# Patient Record
Sex: Male | Born: 1975 | Race: Black or African American | Hispanic: No | Marital: Married | Smoking: Never smoker
Health system: Southern US, Community
[De-identification: ages and names within clinical notes are randomized; demographics above are authoritative.]

## PROBLEM LIST (undated history)

## (undated) DIAGNOSIS — I10 Essential (primary) hypertension: Secondary | ICD-10-CM

## (undated) DIAGNOSIS — N186 End stage renal disease: Secondary | ICD-10-CM

## (undated) HISTORY — PX: EYE SURGERY: SHX253

---

## 2005-07-01 ENCOUNTER — Ambulatory Visit: Payer: Self-pay | Admitting: Unknown Physician Specialty

## 2007-09-20 ENCOUNTER — Inpatient Hospital Stay (HOSPITAL_COMMUNITY): Admission: EM | Admit: 2007-09-20 | Discharge: 2007-09-21 | Payer: Self-pay | Admitting: Emergency Medicine

## 2007-09-21 ENCOUNTER — Ambulatory Visit: Payer: Self-pay | Admitting: Psychiatry

## 2007-09-21 ENCOUNTER — Encounter (INDEPENDENT_AMBULATORY_CARE_PROVIDER_SITE_OTHER): Payer: Self-pay | Admitting: Cardiovascular Disease

## 2009-07-02 ENCOUNTER — Emergency Department: Payer: Self-pay | Admitting: Emergency Medicine

## 2010-04-02 ENCOUNTER — Emergency Department: Payer: Self-pay | Admitting: Emergency Medicine
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
## 2010-09-07 NOTE — Discharge Summary (Signed)
[name], [name]              ACCOUNT NO.:  [id number]   MEDICAL RECORD NO.:  [id number]          PATIENT TYPE:  INP   LOCATION:  3734                         FACILITY:  [facility]   PHYSICIAN:  [name], M.D.  DATE OF BIRTH:  1975-05-20   DATE OF ADMISSION:  09/20/2007  DATE OF DISCHARGE:  09/21/2007                               DISCHARGE SUMMARY   FINAL DIAGNOSES:  1. Paroxysmal atrial fibrillation.  2. Uncontrolled diabetes mellitus.  3. Depression.  4. Chronic alcohol use.   DISCHARGE MEDICATIONS:  1. Glucophage 500 mg 1 twice daily.  2. Zoloft 50 mg 1 daily.  3. Cardizem 120 mg extended release 1 daily.  4. Levemir 10-30 units subcutaneously at bedtime as directed.   DISCHARGE DIET:  Low-sodium, heart healthy diet and carbohydrate  modified medium calorie diet.   DISCHARGE ACTIVITY:  The patient to increase activity slowly.   SPECIAL INSTRUCTION:  The patient to stop any activity that causes chest  pain, shortness of breath, dizziness, sweating, or excessive weakness  and he is to discontinue alcohol of any kind including beer or wine.   FOLLOWUP:  Follow up by Dr. Orpah Cobb in 1 month.  The patient to  call [phone] for appointment.   HISTORY:  This 35 year old black male presented with heart palpitation  and fast heart rate shown on Electrocardiogram done as a preop  evaluation before his left eye cataract surgery.  The patient admitted  to using Stamina Rx containing caffeine to boost his energy, the night  before palpitation.   PHYSICAL EXAMINATION:  VITAL SIGNS:  Temperature 98, pulse 87,  respirations 19, blood pressure 110/63, height 5 feet 8 inches, and  weight 200 pounds.  GENERAL:  The patient is alert, oriented x3.  He is a 35 year old black  male in no major distress.  HEENT:  The patient is normocephalic and atraumatic.  He has brown eyes.  Pupil equal and reacting to light.  He has a right lens implant, left  lens hazy.  NECK:  No JVD.  No  carotid bruit.  LUNGS:  Clear bilaterally.  HEART:  Normal S1 and S2.  ABDOMEN:  Soft and nontender.  EXTREMITIES:  No edema, cyanosis, or clubbing.  SKIN:  Warm and dry.  NEUROLOGICALLY:  The patient moves all four extremities.   LABORATORY DATA:  Normal hemoglobin, hematocrit, WBC count, and platelet  count.  Normal  electrolytes, BUN, and creatinine.  Glucose 295.  Normal  liver enzymes, borderline bilirubin of 1.4.   Chest x-ray, no acute findings.   EKG, atrial fibrillation with a rapid ventricular response. Subsequent  EKG, normal sinus rhythm.   HOSPITAL COURSE:  The patient was admitted to telemetry unit.  He  received IV Cardizem.  The patient converted to sinus rhythm.  He was  switched to oral Cardizem.  His insulin was started.  He was placed on  Glucophage. With a history of depression of long duration, the patient  had a psychiatric evaluation by Dr. Electa Sniff.  The patient has been  referred to outpatient psychiatric followups.  He was started on Zoloft  50 mg 1 daily.  He was given instructions on diabetic control and care,  and he will be followed by primary care physician as arranged and by me  in 1 month.      [name], M.D.  Electronically Signed     ASK/MEDQ  D:  09/21/2007  T:  09/22/2007  Job:  [id number]

## 2011-01-19 LAB — POCT I-STAT, CHEM 8
Creatinine, Ser: 0.9
Glucose, Bld: 295 — ABNORMAL HIGH
Hemoglobin: 18.4 — ABNORMAL HIGH
Sodium: 140
TCO2: 23

## 2011-01-19 LAB — HEPATIC FUNCTION PANEL
ALT: 16
Indirect Bilirubin: 1.2 — ABNORMAL HIGH
Total Protein: 7.5

## 2011-01-19 LAB — CBC
MCV: 88.8
Platelets: 214
WBC: 6.4

## 2011-01-19 LAB — POCT CARDIAC MARKERS: Myoglobin, poc: 33.2

## 2011-01-19 LAB — LIPID PANEL
HDL: 42
Triglycerides: 145
VLDL: 29

## 2011-01-19 LAB — CARDIAC PANEL(CRET KIN+CKTOT+MB+TROPI)
CK, MB: 2.1
Relative Index: 2.1
Total CK: 100
Troponin I: 0.02
Troponin I: 0.02

## 2011-01-19 LAB — RAPID URINE DRUG SCREEN, HOSP PERFORMED
Benzodiazepines: NOT DETECTED
Cocaine: NOT DETECTED

## 2011-01-19 LAB — HEMOGLOBIN A1C: Mean Plasma Glucose: 425
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
## 2014-08-04 DIAGNOSIS — E113519 Type 2 diabetes mellitus with proliferative diabetic retinopathy with macular edema, unspecified eye: Secondary | ICD-10-CM | POA: Insufficient documentation

## 2017-05-17 DIAGNOSIS — H5015 Alternating exotropia: Secondary | ICD-10-CM | POA: Insufficient documentation

## 2017-05-17 DIAGNOSIS — H5021 Vertical strabismus, right eye: Secondary | ICD-10-CM | POA: Insufficient documentation

## 2017-08-16 DIAGNOSIS — I10 Essential (primary) hypertension: Secondary | ICD-10-CM | POA: Insufficient documentation

## 2017-12-22 DIAGNOSIS — Z6833 Body mass index (BMI) 33.0-33.9, adult: Secondary | ICD-10-CM | POA: Insufficient documentation

## 2017-12-22 DIAGNOSIS — H539 Unspecified visual disturbance: Secondary | ICD-10-CM | POA: Insufficient documentation

## 2017-12-22 DIAGNOSIS — R600 Localized edema: Secondary | ICD-10-CM | POA: Insufficient documentation

## 2019-04-26 HISTORY — PX: AV FISTULA PLACEMENT: SHX1204

## 2019-07-12 DIAGNOSIS — Z992 Dependence on renal dialysis: Secondary | ICD-10-CM | POA: Insufficient documentation

## 2019-08-13 DIAGNOSIS — E209 Hypoparathyroidism, unspecified: Secondary | ICD-10-CM | POA: Insufficient documentation

## 2019-08-13 DIAGNOSIS — D509 Iron deficiency anemia, unspecified: Secondary | ICD-10-CM | POA: Diagnosis present

## 2019-11-21 DIAGNOSIS — T829XXA Unspecified complication of cardiac and vascular prosthetic device, implant and graft, initial encounter: Secondary | ICD-10-CM | POA: Insufficient documentation

## 2021-03-25 DIAGNOSIS — H532 Diplopia: Secondary | ICD-10-CM | POA: Insufficient documentation

## 2021-03-25 DIAGNOSIS — E785 Hyperlipidemia, unspecified: Secondary | ICD-10-CM | POA: Insufficient documentation

## 2021-11-06 ENCOUNTER — Emergency Department: Payer: Self-pay

## 2021-11-06 ENCOUNTER — Other Ambulatory Visit: Payer: Self-pay

## 2021-11-06 ENCOUNTER — Encounter: Payer: Self-pay | Admitting: Emergency Medicine

## 2021-11-06 ENCOUNTER — Emergency Department
Admission: EM | Admit: 2021-11-06 | Discharge: 2021-11-06 | Disposition: A | Discharge status: Home / Self Care | Payer: Self-pay | Attending: Emergency Medicine | Admitting: Emergency Medicine

## 2021-11-06 DIAGNOSIS — S40021A Contusion of right upper arm, initial encounter: Secondary | ICD-10-CM | POA: Diagnosis not present

## 2021-11-06 DIAGNOSIS — Y92410 Unspecified street and highway as the place of occurrence of the external cause: Secondary | ICD-10-CM | POA: Insufficient documentation

## 2021-11-06 DIAGNOSIS — S4991XA Unspecified injury of right shoulder and upper arm, initial encounter: Secondary | ICD-10-CM | POA: Diagnosis present

## 2021-11-06 HISTORY — DX: End stage renal disease: N18.6

## 2021-11-06 HISTORY — DX: Essential (primary) hypertension: I10

## 2021-11-06 NOTE — ED Notes (Signed)
Pt states hasn't taken his evening BP med yet today but will when he gets home.

## 2021-11-06 NOTE — ED Notes (Signed)
Pt attempted to sign for d/c paperwork and education but topaz frozen.  
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
## 2022-09-21 ENCOUNTER — Encounter: Payer: Self-pay | Admitting: Nephrology

## 2022-09-30 ENCOUNTER — Encounter: Payer: Self-pay | Admitting: Internal Medicine

## 2022-11-07 ENCOUNTER — Ambulatory Visit (INDEPENDENT_AMBULATORY_CARE_PROVIDER_SITE_OTHER): Payer: PRIVATE HEALTH INSURANCE | Admitting: Internal Medicine

## 2022-11-07 ENCOUNTER — Other Ambulatory Visit (INDEPENDENT_AMBULATORY_CARE_PROVIDER_SITE_OTHER): Payer: PRIVATE HEALTH INSURANCE

## 2022-11-07 ENCOUNTER — Encounter: Payer: Self-pay | Admitting: Internal Medicine

## 2022-11-07 VITALS — BP 180/100 | HR 86 | Ht 69.0 in | Wt 207.0 lb

## 2022-11-07 DIAGNOSIS — K3184 Gastroparesis: Secondary | ICD-10-CM

## 2022-11-07 DIAGNOSIS — R1013 Epigastric pain: Secondary | ICD-10-CM | POA: Diagnosis not present

## 2022-11-07 DIAGNOSIS — R935 Abnormal findings on diagnostic imaging of other abdominal regions, including retroperitoneum: Secondary | ICD-10-CM | POA: Diagnosis not present

## 2022-11-07 DIAGNOSIS — Z1211 Encounter for screening for malignant neoplasm of colon: Secondary | ICD-10-CM

## 2022-11-07 DIAGNOSIS — D649 Anemia, unspecified: Secondary | ICD-10-CM
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
## 2022-11-07 NOTE — Patient Instructions (Addendum)
Your provider has requested that you go to the basement level for lab work before leaving today. Press "B" on the elevator. The lab is located at the first door on the left as you exit the elevator.  You have been scheduled for an endoscopy and colonoscopy. Please follow the written instructions given to you at your visit today.  Please pick up your prep supplies at the pharmacy within the next 1-3 days.  If you use inhalers (even only as needed), please bring them with you on the day of your procedure.  DO NOT TAKE 7 DAYS PRIOR TO TEST- Trulicity (dulaglutide) Ozempic, Wegovy (semaglutide) Mounjaro (tirzepatide) Bydureon Bcise (exanatide extended release)  DO NOT TAKE 1 DAY PRIOR TO YOUR TEST Rybelsus (semaglutide) Adlyxin (lixisenatide) Victoza (liraglutide) Byetta (exanatide) ___________________________________________________________________________    _______________________________________________________  If your blood pressure at your visit was 140/90 or greater, please contact your primary care physician to follow up on this.  _______________________________________________________  If you are age 68 or older, your body mass index should be between 23-30. Your Body mass index is 30.57 kg/m. If this is out of the aforementioned range listed, please consider follow up with your Primary Care Provider.  If you are age 5 or younger, your body mass index should be between 19-25. Your Body mass index is 30.57 kg/m. If this is out of the aformentioned range listed, please consider follow up with your Primary Care Provider.   ________________________________________________________  The [facility] providers would like to encourage you to use [facility] to communicate with providers for non-urgent requests or questions.  Due to long hold times on the telephone, sending your provider a message by [facility] may be a faster and more efficient way to get a response.  Please allow 48  business hours for a response.  Please remember that this is for non-urgent requests.  _______________________________________________________   Due to recent changes in healthcare laws, you may see the results of your imaging and laboratory studies on MyChart before your provider has had a chance to review them.  We understand that in some cases there may be results that are confusing or concerning to you. Not all laboratory results come back in the same time frame and the provider may be waiting for multiple results in order to interpret others.  Please give [place] 48 hours in order for your provider to thoroughly review all the results before contacting the office for clarification of your results.    Thank you for entrusting me with your care and for choosing [facility], Dr. Eulah Pont

## 2022-11-08 LAB — VITAMIN B12: Vitamin B-12: 206 pg/mL — ABNORMAL LOW (ref 211–911)

## 2022-11-08 LAB — IBC + FERRITIN
Ferritin: 718.9 ng/mL — ABNORMAL HIGH (ref 22.0–322.0)
Iron: 69 ug/dL (ref 42–165)
Saturation Ratios: 41.4 % (ref 20.0–50.0)
TIBC: 166.6 ug/dL — ABNORMAL LOW (ref 250.0–450.0)
Transferrin: 119 mg/dL — ABNORMAL LOW (ref 212.0–360.0)

## 2022-11-08 LAB — FOLATE: Folate: 9.8 ng/mL (ref >5.9)

## 2022-11-09 ENCOUNTER — Other Ambulatory Visit: Payer: Self-pay

## 2022-11-09 ENCOUNTER — Telehealth: Payer: Self-pay

## 2022-11-09 ENCOUNTER — Encounter (HOSPITAL_COMMUNITY): Payer: Self-pay | Admitting: Internal Medicine

## 2022-11-09 NOTE — Progress Notes (Signed)
[name]  Prep instructions- reviewed  PCP- [facility]  Cardiologist- Dr Katrinka Blazing at [facility]  EKG- 10/25/22 Echo-10/25/22 Cath-n/a Stress-n/a ICD/PM-n/a Blood thinner-n/a GLP-1- n/a  Hx:  HTN, ESRD, DM2. He is currently being worked up for kidney transplant through [facility]. Saw cardiologist at [facility] where they have done ekg, echo and are recommending cath due to him being high risk for CAD. At this time he is having no cardiac symptoms but of note his BP was elevated at his last cardiac appt on 7/2 and then again it was around the same at his GI appt 7/15 (around 180's systolic/ 100's diastolic). I asked him about his BP since then and he said he wasn't as strict about taking his meds and since then he has been better last checks were 140's/80's.  Anesthesia Review: Yes

## 2022-11-09 NOTE — Telephone Encounter (Signed)
Patient is scheduled for EGD/colonoscopy 11/14/22. Received secure chat from pre-admissions/anesthesia at [facility] requesting cardiac clearance for the patient before proceeding with the EGD and colonoscopy.  Letter printed and faxed to [facility] [address] [place], [place]  [address]  Attn: Dr Eduardo Osier  Phone - [phone] Fax - [phone]

## 2022-11-10 NOTE — Telephone Encounter (Signed)
Called the patient to make him aware of this as well. No answer. His voicemail is full.

## 2022-11-11 NOTE — Telephone Encounter (Signed)
Spoke with the patient. Confirmed with the patient he will not have the EGD and colonoscopy on 11/14/22. He will plan for the procedures to be done on 12/22/22. New instructions will be mailed to him. Cardiac catherization is scheduled for 11/17/22 per the patient.

## 2022-11-11 NOTE — Telephone Encounter (Signed)
Case for 11/14/22 canceled. Spoke with the patient on 11/10/22 and advised him this was probably going to happen. Tried to call the patient today. No answer. Voicemail is full. Case moved to 12/22/22.

## 2022-11-14 ENCOUNTER — Ambulatory Visit (HOSPITAL_COMMUNITY)
Admission: RE | Admit: 2022-11-14 | Payer: PRIVATE HEALTH INSURANCE | Source: Ambulatory Visit | Admitting: Internal Medicine

## 2022-11-14 SURGERY — COLONOSCOPY WITH PROPOFOL
Anesthesia: Monitor Anesthesia Care

## 2022-11-15 ENCOUNTER — Other Ambulatory Visit: Payer: Self-pay

## 2022-11-15 DIAGNOSIS — R935 Abnormal findings on diagnostic imaging of other abdominal regions, including retroperitoneum: Secondary | ICD-10-CM

## 2022-11-15 DIAGNOSIS — D649 Anemia, unspecified: Secondary | ICD-10-CM

## 2022-11-15 DIAGNOSIS — R1013 Epigastric pain: Secondary | ICD-10-CM

## 2022-11-15 NOTE — Telephone Encounter (Signed)
New instructions printed and mailed to the patient for his colonoscopy and EGD.

## 2022-11-21 ENCOUNTER — Telehealth: Payer: Self-pay

## 2022-11-21 NOTE — Telephone Encounter (Signed)
Received via fax medical clearance from Dr Katrinka Blazing okay to have procedure on 12/22/22.

## 2022-12-14 NOTE — Progress Notes (Addendum)
Anesthesia Review:  PCP: Cardiologist : Chest x-ray : EKG : Echo : Stress test: Cardiac Cath :  Activity level:  Sleep Study/ CPAP : Fasting Blood Sugar :      / Checks Blood Sugar -- times a day:   Blood Thinner/ Instructions /Last Dose: ASA / Instructions/ Last Dose :    Dialysis pt  Access-    PT was called on 12/14/22 for preop instructions in regards to colonoscopy on 12/22/22.  PT states this am he had a small procedure done to his fistual on 12/14/22 am and he has had some sedating meds and wanted a call back at another time.

## 2022-12-15 ENCOUNTER — Encounter (HOSPITAL_COMMUNITY): Payer: Self-pay | Admitting: Internal Medicine

## 2022-12-22 ENCOUNTER — Encounter (HOSPITAL_COMMUNITY): Payer: Self-pay | Admitting: Internal Medicine

## 2022-12-22 ENCOUNTER — Ambulatory Visit (HOSPITAL_COMMUNITY)
Admission: RE | Admit: 2022-12-22 | Discharge: 2022-12-22 | Disposition: A | Discharge status: Home / Self Care | Payer: PRIVATE HEALTH INSURANCE | Source: Ambulatory Visit | Attending: Internal Medicine | Admitting: Internal Medicine

## 2022-12-22 ENCOUNTER — Other Ambulatory Visit: Payer: Self-pay

## 2022-12-22 ENCOUNTER — Encounter (HOSPITAL_COMMUNITY)
Admission: RE | Disposition: A | Discharge status: Home / Self Care | Payer: Self-pay | Source: Ambulatory Visit | Attending: Internal Medicine

## 2022-12-22 ENCOUNTER — Ambulatory Visit (HOSPITAL_COMMUNITY): Payer: PRIVATE HEALTH INSURANCE | Admitting: Certified Registered"

## 2022-12-22 DIAGNOSIS — K2970 Gastritis, unspecified, without bleeding: Secondary | ICD-10-CM | POA: Diagnosis not present

## 2022-12-22 DIAGNOSIS — K648 Other hemorrhoids: Secondary | ICD-10-CM | POA: Diagnosis not present

## 2022-12-22 DIAGNOSIS — N186 End stage renal disease: Secondary | ICD-10-CM | POA: Diagnosis not present

## 2022-12-22 DIAGNOSIS — K3189 Other diseases of stomach and duodenum: Secondary | ICD-10-CM | POA: Diagnosis not present

## 2022-12-22 DIAGNOSIS — Z1211 Encounter for screening for malignant neoplasm of colon: Secondary | ICD-10-CM | POA: Diagnosis present

## 2022-12-22 DIAGNOSIS — K3184 Gastroparesis: Secondary | ICD-10-CM | POA: Insufficient documentation

## 2022-12-22 DIAGNOSIS — R933 Abnormal findings on diagnostic imaging of other parts of digestive tract: Secondary | ICD-10-CM | POA: Diagnosis not present

## 2022-12-22 DIAGNOSIS — R1013 Epigastric pain: Secondary | ICD-10-CM

## 2022-12-22 DIAGNOSIS — Z992 Dependence on renal dialysis: Secondary | ICD-10-CM | POA: Diagnosis not present

## 2022-12-22 DIAGNOSIS — R935 Abnormal findings on diagnostic imaging of other abdominal regions, including retroperitoneum: Secondary | ICD-10-CM

## 2022-12-22 DIAGNOSIS — D649 Anemia, unspecified: Secondary | ICD-10-CM

## 2022-12-22 DIAGNOSIS — I120 Hypertensive chronic kidney disease with stage 5 chronic kidney disease or end stage renal disease: Secondary | ICD-10-CM

## 2022-12-22 DIAGNOSIS — K31A19 Gastric intestinal metaplasia without dysplasia, unspecified site: Secondary | ICD-10-CM | POA: Diagnosis not present

## 2022-12-22 DIAGNOSIS — D123 Benign neoplasm of transverse colon: Secondary | ICD-10-CM | POA: Diagnosis not present

## 2022-12-22 DIAGNOSIS — D126 Benign neoplasm of colon, unspecified: Secondary | ICD-10-CM | POA: Diagnosis not present

## 2022-12-22 DIAGNOSIS — K2950 Unspecified chronic gastritis without bleeding: Secondary | ICD-10-CM | POA: Insufficient documentation

## 2022-12-22 HISTORY — PX: ESOPHAGOGASTRODUODENOSCOPY (EGD) WITH PROPOFOL: SHX5813

## 2022-12-22 HISTORY — PX: COLONOSCOPY WITH PROPOFOL: SHX5780

## 2022-12-22 HISTORY — PX: POLYPECTOMY: SHX5525

## 2022-12-22 HISTORY — PX: BIOPSY: SHX5522

## 2022-12-22 LAB — POCT I-STAT, CHEM 8
BUN: 20 mg/dL (ref 6–20)
Calcium, Ion: 1.12 mmol/L — ABNORMAL LOW (ref 1.15–1.40)
Chloride: 101 mmol/L (ref 98–111)
Creatinine, Ser: 9.5 mg/dL — ABNORMAL HIGH (ref 0.61–1.24)
Glucose, Bld: 87 mg/dL (ref 70–99)
HCT: 41 % (ref 39.0–52.0)
Hemoglobin: 13.9 g/dL (ref 13.0–17.0)
Potassium: 3.3 mmol/L — ABNORMAL LOW (ref 3.5–5.1)
Sodium: 139 mmol/L (ref 135–145)
TCO2: 26 mmol/L (ref 22–32)

## 2022-12-22 SURGERY — COLONOSCOPY WITH PROPOFOL
Anesthesia: Monitor Anesthesia Care

## 2022-12-22 MED ORDER — PROPOFOL 10 MG/ML IV BOLUS
INTRAVENOUS | Status: DC | PRN
Start: 2022-12-22 — End: 2022-12-22
  Administered 2022-12-22 (×2): 20 mg via INTRAVENOUS

## 2022-12-22 MED ORDER — SODIUM CHLORIDE 0.9 % IV SOLN: INTRAVENOUS | Status: DC

## 2022-12-22 MED ORDER — LIDOCAINE 2% (20 MG/ML) 5 ML SYRINGE
INTRAMUSCULAR | Status: DC | PRN
  Administered 2022-12-22: 80 mg via INTRAVENOUS

## 2022-12-22 MED ORDER — PROPOFOL 500 MG/50ML IV EMUL
INTRAVENOUS | Status: DC | PRN
  Administered 2022-12-22: 125 ug/kg/min via INTRAVENOUS

## 2022-12-22 MED ORDER — PANTOPRAZOLE SODIUM 40 MG PO TBEC: 40.0000 mg | DELAYED_RELEASE_TABLET | Freq: Two times a day (BID) | ORAL | 2 refills | Status: AC

## 2022-12-22 SURGICAL SUPPLY — 25 items
BLOCK BITE 60FR ADLT L/F BLUE (MISCELLANEOUS) ×2 IMPLANT
ELECT REM PT RETURN 9FT ADLT (ELECTROSURGICAL)
ELECTRODE REM PT RTRN 9FT ADLT (ELECTROSURGICAL) IMPLANT
FCP BXJMBJMB 240X2.8X (CUTTING FORCEPS)
FLOOR PAD 36X40 (MISCELLANEOUS) ×2
FORCEP RJ3 GP 1.8X160 W-NEEDLE (CUTTING FORCEPS) IMPLANT
FORCEPS BIOP RAD 4 LRG CAP 4 (CUTTING FORCEPS) IMPLANT
FORCEPS BIOP RJ4 240 W/NDL (CUTTING FORCEPS)
FORCEPS BXJMBJMB 240X2.8X (CUTTING FORCEPS) IMPLANT
INJECTOR/SNARE I SNARE (MISCELLANEOUS) IMPLANT
LUBRICANT JELLY 4.5OZ STERILE (MISCELLANEOUS) IMPLANT
MANIFOLD NEPTUNE II (INSTRUMENTS) IMPLANT
NDL SCLEROTHERAPY 25GX240 (NEEDLE) IMPLANT
NEEDLE SCLEROTHERAPY 25GX240 (NEEDLE)
PAD FLOOR 36X40 (MISCELLANEOUS) ×2 IMPLANT
PROBE APC STR FIRE (PROBE) IMPLANT
PROBE INJECTION GOLD (MISCELLANEOUS)
PROBE INJECTION GOLD 7FR (MISCELLANEOUS) IMPLANT
SNARE ROTATE MED OVAL 20MM (MISCELLANEOUS) IMPLANT
SNARE SHORT THROW 13M SML OVAL (MISCELLANEOUS) IMPLANT
SYR 50ML LL SCALE MARK (SYRINGE) IMPLANT
TRAP SPECIMEN MUCOUS 40CC (MISCELLANEOUS) IMPLANT
TUBING ENDO SMARTCAP PENTAX (MISCELLANEOUS) ×4 IMPLANT
TUBING IRRIGATION ENDOGATOR (MISCELLANEOUS) ×2 IMPLANT
WATER STERILE IRR 1000ML POUR (IV SOLUTION) IMPLANT

## 2022-12-22 NOTE — Anesthesia Preprocedure Evaluation (Addendum)
Anesthesia Evaluation  Patient identified by MRN, date of birth, ID band Patient awake    Reviewed: Allergy & Precautions, NPO status , Patient's Chart, lab work & pertinent test results, reviewed documented beta blocker date and time   Airway Mallampati: II  TM Distance: >3 FB Neck ROM: Full    Dental  (+) Teeth Intact, Dental Advisory Given, Poor Dentition, Chipped,    Pulmonary neg pulmonary ROS   Pulmonary exam normal breath sounds clear to auscultation       Cardiovascular hypertension, Pt. on medications and Pt. on home beta blockers Normal cardiovascular exam Rhythm:Regular Rate:Normal     Neuro/Psych negative neurological ROS  negative psych ROS   GI/Hepatic Neg liver ROS,GERD  Medicated,,CCS, Gastroparesis   Endo/Other  negative endocrine ROS    Renal/GU ESRF and DialysisRenal disease (home hemodialysis)currently being worked up for kidney transplant through [facility]     Musculoskeletal negative musculoskeletal ROS (+)    Abdominal   Peds  Hematology negative hematology ROS (+)   Anesthesia Other Findings Day of surgery medications reviewed with the patient.  Reproductive/Obstetrics                             Anesthesia Physical Anesthesia Plan  ASA: 3  Anesthesia Plan: MAC   Post-op Pain Management:    Induction: Intravenous  PONV Risk Score and Plan: 1 and TIVA and Treatment may vary due to age or medical condition  Airway Management Planned: Natural Airway and Simple Face Mask  Additional Equipment:   Intra-op Plan:   Post-operative Plan:   Informed Consent: I have reviewed the patients History and Physical, chart, labs and discussed the procedure including the risks, benefits and alternatives for the proposed anesthesia with the patient or authorized representative who has indicated his/her understanding and acceptance.     Dental advisory given  Plan Discussed  with: CRNA and Anesthesiologist  Anesthesia Plan Comments:        Anesthesia Quick Evaluation

## 2022-12-22 NOTE — Anesthesia Postprocedure Evaluation (Signed)
Anesthesia Post Note  Patient: FAHD ANTCZAK  Procedure(s) Performed: COLONOSCOPY WITH PROPOFOL ESOPHAGOGASTRODUODENOSCOPY (EGD) WITH PROPOFOL BIOPSY POLYPECTOMY     Patient location during evaluation: Endoscopy Anesthesia Type: MAC Level of consciousness: oriented, awake and alert and awake Pain management: pain level controlled Vital Signs Assessment: post-procedure vital signs reviewed and stable Respiratory status: spontaneous breathing, nonlabored ventilation, respiratory function stable and patient connected to nasal cannula oxygen Cardiovascular status: blood pressure returned to baseline and stable Postop Assessment: no headache, no backache and no apparent nausea or vomiting Anesthetic complications: no   No notable events documented.  Last Vitals:  Vitals:   12/22/22 1140 12/22/22 1150  BP: (!) 162/91 (!) 174/100  Pulse: 81 79  Resp: 20 16  Temp:    SpO2: 100% 100%    Last Pain:  Vitals:   12/22/22 1150  TempSrc:   PainSc: 0-No pain                 Collene Schlichter

## 2022-12-22 NOTE — Op Note (Signed)
[facility] Patient Name: [name] Procedure Date: 12/22/2022 MRN: [id number] Attending MD: Particia Lather , , [id number] Date of Birth: 11-20-1975 CSN: [id number] Age: 47 Admit Type: Outpatient Procedure:                Colonoscopy Indications:              Screening for colorectal malignant neoplasm Providers:                Madelyn Brunner" Daylene Posey, RN,                            Jacquelyn "Jaci" Clelia Croft, RN, Rozetta Nunnery,                            Technician Referring MD:             Starla Link, MD Medicines:                Monitored Anesthesia Care Complications:            No immediate complications. Estimated Blood Loss:     Estimated blood loss was minimal. Procedure:                Pre-Anesthesia Assessment:                           - Prior to the procedure, a History and Physical                            was performed, and patient medications and                            allergies were reviewed. The patient's tolerance of                            previous anesthesia was also reviewed. The risks                            and benefits of the procedure and the sedation                            options and risks were discussed with the patient.                            All questions were answered, and informed consent                            was obtained. Prior Anticoagulants: The patient has                            taken no anticoagulant or antiplatelet agents. ASA                            Grade Assessment: III - A patient with severe  systemic disease. After reviewing the risks and                            benefits, the patient was deemed in satisfactory                            condition to undergo the procedure.                           After obtaining informed consent, the colonoscope                            was passed under direct vision. Throughout the                             procedure, the patient's blood pressure, pulse, and                            oxygen saturations were monitored continuously. The                            [id number]-HQ190L ([id number]) Olympus colonoscope was                            introduced through the anus and advanced to the the                            terminal ileum. The colonoscopy was performed                            without difficulty. The patient tolerated the                            procedure well. The quality of the bowel                            preparation was good. The terminal ileum, ileocecal                            valve, appendiceal orifice, and rectum were                            photographed. Scope In: 11:17:30 AM Scope Out: 11:29:04 AM Scope Withdrawal Time: 0 hours 8 minutes 22 seconds  Total Procedure Duration: 0 hours 11 minutes 34 seconds  Findings:      The terminal ileum appeared normal.      A 4 mm polyp was found in the transverse colon. The polyp was sessile.       The polyp was removed with a cold snare. Resection and retrieval were       complete.      Non-bleeding internal hemorrhoids were found during retroflexion. Impression:               - The examined portion of the ileum was normal.                           -  One 4 mm polyp in the transverse colon, removed                            with a cold snare. Resected and retrieved.                           - Non-bleeding internal hemorrhoids. Moderate Sedation:      Not Applicable - Patient had care per Anesthesia. Recommendation:           - Discharge patient to home (with escort).                           - Await pathology results.                           - The findings and recommendations were discussed                            with the patient. Procedure Code(s):        --- Professional ---                           [phone], Colonoscopy, flexible; with removal of                            tumor(s), polyp(s), or other  lesion(s) by snare                            technique Diagnosis Code(s):        --- Professional ---                           Z12.11, Encounter for screening for malignant                            neoplasm of colon                           K64.8, Other hemorrhoids                           D12.3, Benign neoplasm of transverse colon (hepatic                            flexure or splenic flexure) CPT copyright 2022 American Medical Association. All rights reserved. The codes documented in this report are preliminary and upon coder review may  be revised to meet current compliance requirements. Dr Particia Lather "Alan Ripper" Leonides Schanz,  12/22/2022 11:42:15 AM Number of Addenda: 0

## 2022-12-22 NOTE — Op Note (Signed)
[facility] Patient Name: [name] Procedure Date: 12/22/2022 MRN: [id number] Attending MD: Particia Lather , , [id number] Date of Birth: 1975/08/22 CSN: [id number] Age: 47 Admit Type: Outpatient Procedure:                Upper GI endoscopy Indications:              Epigastric abdominal pain, Abnormal CT of the GI                            tract Providers:                Madelyn Brunner" Daylene Posey, RN,                            Jacquelyn "Jaci" Clelia Croft, RN, Rozetta Nunnery,                            Technician Referring MD:             Starla Link, MD Medicines:                Monitored Anesthesia Care Complications:            No immediate complications. Estimated Blood Loss:     Estimated blood loss was minimal. Procedure:                Pre-Anesthesia Assessment:                           - Prior to the procedure, a History and Physical                            was performed, and patient medications and                            allergies were reviewed. The patient's tolerance of                            previous anesthesia was also reviewed. The risks                            and benefits of the procedure and the sedation                            options and risks were discussed with the patient.                            All questions were answered, and informed consent                            was obtained. Prior Anticoagulants: The patient has                            taken no anticoagulant or antiplatelet agents. ASA  Grade Assessment: III - A patient with severe                            systemic disease. After reviewing the risks and                            benefits, the patient was deemed in satisfactory                            condition to undergo the procedure.                           After obtaining informed consent, the endoscope was                            passed under direct  vision. Throughout the                            procedure, the patient's blood pressure, pulse, and                            oxygen saturations were monitored continuously. The                            [id number]-[id number] ([id number]) Olympus endoscope was introduced                            through the mouth, and advanced to the second part                            of duodenum. The upper GI endoscopy was                            accomplished without difficulty. The patient                            tolerated the procedure well. Scope In: Scope Out: Findings:      The examined esophagus was normal.      Localized inflammation characterized by congestion (edema), erosions and       erythema was found in the gastric body and in the gastric antrum.       Biopsies were taken with a cold forceps for histology.      The examined duodenum was normal. Impression:               - Normal esophagus.                           - Gastritis. Biopsied.                           - Normal examined duodenum. Moderate Sedation:      Not Applicable - Patient had care per Anesthesia. Recommendation:           - Await pathology results.                           -  Will increase pantoprazole to 40 mg twice daily.                           - Return to GI clinic in 2-3 months.                           - Perform a colonoscopy today. Procedure Code(s):        --- Professional ---                           [phone], Esophagogastroduodenoscopy, flexible,                            transoral; with biopsy, single or multiple Diagnosis Code(s):        --- Professional ---                           K29.70, Gastritis, unspecified, without bleeding                           R10.13, Epigastric pain                           R93.3, Abnormal findings on diagnostic imaging of                            other parts of digestive tract CPT copyright 2022 American Medical Association. All rights reserved. The codes documented in  this report are preliminary and upon coder review may  be revised to meet current compliance requirements. Dr Particia Lather "Alan Ripper" Leonides Schanz,  12/22/2022 11:40:18 AM Number of Addenda: 0

## 2022-12-22 NOTE — H&P (Signed)
GASTROENTEROLOGY PROCEDURE H&P NOTE   Primary Care Physician: Starla Link, MD    Reason for Procedure:   Gastritis on CT scan, epigastric ab pain, colon cancer screening  Plan:    EGD/colonoscopy  Patient is appropriate for endoscopic procedure(s) in the hospital setting.  The nature of the procedure, as well as the risks, benefits, and alternatives were carefully and thoroughly reviewed with the patient. Ample time for discussion and questions allowed. The patient understood, was satisfied, and agreed to proceed.     HPI: [name] is a 47 y.o. male who presents for EGD/colonoscopy for evaluation of gastritis on CT scan, epigastric ab pain, and colon cancer screening .  Patient was most recently seen in the Gastroenterology Clinic on 11/07/22.  No interval change in medical history since that appointment. Please refer to that note for full details regarding GI history and clinical presentation.   Past Medical History:  Diagnosis Date   ESRD (end stage renal disease) (HCC)    Hypertension     Past Surgical History:  Procedure Laterality Date   EYE SURGERY      Prior to Admission medications   Medication Sig Start Date End Date Taking? Authorizing Provider  amLODipine (NORVASC) 10 MG tablet Take by mouth daily. Patient not taking: Reported on 12/15/2022    [provider]  calcitRIOL (ROCALTROL) 0.5 MCG capsule Take 0.5 mcg by mouth daily.    [provider]  carvedilol (COREG) 25 MG tablet Take 25 mg by mouth 2 (two) times daily with a meal.    [provider]  doxazosin (CARDURA) 2 MG tablet Take 2 mg by mouth at bedtime.    [provider]  metoCLOPramide (REGLAN) 5 MG tablet Take 5 mg by mouth 4 (four) times daily.    [provider]  pantoprazole (PROTONIX) 40 MG tablet Take 40 mg by mouth daily.    [provider]    Current Facility-Administered Medications  Medication Dose Route Frequency Provider  Last Rate Last Admin   0.9 %  sodium chloride infusion   Intravenous Continuous Imogene Burn, MD        Allergies as of 11/11/2022 - Review Complete 11/09/2022  Allergen Reaction Noted   Morphine Swelling 11/06/2021   Sulfa antibiotics Swelling 11/06/2021    Family History  Problem Relation Age of Onset   Diabetes Mother    Lung disease Father    Liver disease Neg Hx    Esophageal cancer Neg Hx    Colon cancer Neg Hx     Social History   Socioeconomic History   Marital status: Married    Spouse name: Not on file   Number of children: 4   Years of education: Not on file   Highest education level: Not on file  Occupational History   Occupation: disable  Tobacco Use   Smoking status: Never   Smokeless tobacco: Never  Vaping Use   Vaping status: Not on file  Substance and Sexual Activity   Alcohol use: Not on file   Drug use: Not on file   Sexual activity: Not on file  Other Topics Concern   Not on file  Social History Narrative   Not on file   Social Determinants of Health   Financial Resource Strain: Low Risk  (10/30/2019)   Received from [facility], [facility]   Overall Financial Resource Strain (CARDIA)    Difficulty of Paying Living Expenses: Not hard at  all  Food Insecurity: No Food Insecurity (10/30/2019)   Received from [facility], [facility]   Hunger Vital Sign    Worried About Running Out of Food in the Last Year: Never true    Ran Out of Food in the Last Year: Never true  Transportation Needs: No Transportation Needs (10/30/2019)   Received from [facility], [facility]   [facility] - Transportation    In the past 12 months, has lack of transportation kept you from medical appointments or from getting medications?: No    Lack of Transportation (Non-Medical): No  Physical Activity: Insufficiently Active (10/30/2019)   Received from [facility], [facility]   Exercise Vital Sign    Days of Exercise per Week: 3 days    Minutes of Exercise per Session: 10 min  Stress: No Stress Concern Present (11/16/2018)   Received from [facility], [facility]   [employer] of Occupational Health - Occupational Stress Questionnaire    Feeling of Stress : Only a little  Social Connections: Moderately Isolated (11/16/2018)   Received from [facility], [facility]   Social Connection and Isolation Panel [NHANES]    Frequency of Communication with Friends and Family: More than three times a week    Frequency of Social Gatherings with Friends and Family: Never    Attends Religious Services: Never    Database administrator or Organizations: No    Attends Banker Meetings: Never    Marital Status: Married  Catering manager Violence: Not on file    Physical Exam: Vital signs in last 24 hours: Wt 91.3 kg   BMI 29.72 kg/m  GEN: NAD EYE: Sclerae anicteric ENT: MMM CV: Non-tachycardic Pulm: No increased WOB GI: Soft NEURO:  Alert & Oriented   Eulah Pont, MD [facility]   12/22/2022 10:34 AM

## 2022-12-22 NOTE — Transfer of Care (Signed)
Immediate Anesthesia Transfer of Care Note  Patient: [name]  Procedure(s) Performed: COLONOSCOPY WITH PROPOFOL ESOPHAGOGASTRODUODENOSCOPY (EGD) WITH PROPOFOL BIOPSY POLYPECTOMY  Patient Location: PACU  Anesthesia Type:MAC  Level of Consciousness: awake, alert , and oriented  Airway & Oxygen Therapy: Patient Spontanous Breathing and Patient connected to face mask oxygen  Post-op Assessment: Report given to RN and Post -op Vital signs reviewed and stable  Post vital signs: Reviewed and stable  Last Vitals:  Vitals Value Taken Time  BP    Temp    Pulse    Resp    SpO2      Last Pain:  Vitals:   12/22/22 1034  TempSrc: Temporal  PainSc: 0-No pain         Complications: No notable events documented.

## 2022-12-22 NOTE — Discharge Instructions (Signed)
YOU HAD AN ENDOSCOPIC PROCEDURE TODAY: Refer to the procedure report and other information in the discharge instructions given to you for any specific questions about what was found during the examination. If this information does not answer your questions, please call [place] office at [phone] to clarify.  ° °YOU SHOULD EXPECT: Some feelings of bloating in the abdomen. Passage of more gas than usual. Walking can help get rid of the air that was put into your GI tract during the procedure and reduce the bloating. If you had a lower endoscopy (such as a colonoscopy or flexible sigmoidoscopy) you may notice spotting of blood in your stool or on the toilet paper. Some abdominal soreness may be present for a day or two, also. ° °DIET: Your first meal following the procedure should be a light meal and then it is ok to progress to your normal diet. A half-sandwich or bowl of soup is an example of a good first meal. Heavy or fried foods are harder to digest and may make you feel nauseous or bloated. Drink plenty of fluids but you should avoid alcoholic beverages for 24 hours.  ° °ACTIVITY: Your care partner should take you home directly after the procedure. You should plan to take it easy, moving slowly for the rest of the day. You can resume normal activity the day after the procedure however YOU SHOULD NOT DRIVE, use power tools, machinery or perform tasks that involve climbing or major physical exertion for 24 hours (because of the sedation medicines used during the test).  ° °SYMPTOMS TO REPORT IMMEDIATELY: °A gastroenterologist can be reached at any hour. Please call [phone]  for any of the following symptoms:  °Following lower endoscopy (colonoscopy, flexible sigmoidoscopy) °Excessive amounts of blood in the stool  °Significant tenderness, worsening of abdominal pains  °Swelling of the abdomen that is new, acute  °Fever of 100° or higher  °Following upper endoscopy (EGD, EUS, ERCP, esophageal  dilation) °Vomiting of blood or coffee ground material  °New, significant abdominal pain  °New, significant chest pain or pain under the shoulder blades  °Painful or persistently difficult swallowing  °New shortness of breath  °Black, tarry-looking or red, bloody stools ° °FOLLOW UP:  °If any biopsies were taken you will be contacted by phone or by letter within the next 1-3 weeks. Call [phone]  if you have not heard about the biopsies in 3 weeks.  °Please also call with any specific questions about appointments or follow up tests.  °

## 2022-12-23 ENCOUNTER — Other Ambulatory Visit (HOSPITAL_COMMUNITY): Payer: Self-pay

## 2022-12-23 ENCOUNTER — Telehealth: Payer: Self-pay

## 2022-12-23 NOTE — Telephone Encounter (Signed)
Pharmacy Patient Advocate Encounter   Received notification from [facility] Portal that prior authorization for PANTOPRAZOLE 40MG  is required/requested.   Insurance verification completed.   The patient is insured through [facility] .   Per test claim: PA required; PA submitted to [facility] via Prompt PA Key/confirmation #/[id number] Status is pending

## 2022-12-26 ENCOUNTER — Encounter (HOSPITAL_COMMUNITY): Payer: Self-pay | Admitting: Internal Medicine

## 2022-12-27 ENCOUNTER — Other Ambulatory Visit (HOSPITAL_COMMUNITY): Payer: Self-pay

## 2022-12-27 NOTE — Telephone Encounter (Signed)
Pharmacy Patient Advocate Encounter  Received notification from [facility] that Prior Authorization for Pantoprazole 40mg  has been APPROVED from 12/22/22 to 12/22/23. Ran test claim, Copay is $6.74. This test claim was processed through [facility]- copay amounts may vary at other pharmacies due to pharmacy/plan contracts, or as the patient moves through the different stages of their insurance plan.   PA #/Case ID/Reference #: [id number]

## 2022-12-29 ENCOUNTER — Encounter: Payer: Self-pay | Admitting: Internal Medicine

## 2022-12-29 LAB — SURGICAL PATHOLOGY

## 2023-01-05 ENCOUNTER — Emergency Department (HOSPITAL_COMMUNITY): Payer: PRIVATE HEALTH INSURANCE

## 2023-01-05 ENCOUNTER — Inpatient Hospital Stay (HOSPITAL_COMMUNITY)
Admission: EM | Admit: 2023-01-05 | Discharge: 2023-01-12 | DRG: 193 | Disposition: A | Discharge status: Home / Self Care | Payer: PRIVATE HEALTH INSURANCE | Attending: Internal Medicine | Admitting: Internal Medicine

## 2023-01-05 ENCOUNTER — Other Ambulatory Visit: Payer: Self-pay

## 2023-01-05 ENCOUNTER — Encounter (HOSPITAL_COMMUNITY): Payer: Self-pay

## 2023-01-05 DIAGNOSIS — R946 Abnormal results of thyroid function studies: Secondary | ICD-10-CM | POA: Diagnosis present

## 2023-01-05 DIAGNOSIS — I700 Atherosclerosis of aorta: Secondary | ICD-10-CM | POA: Diagnosis present

## 2023-01-05 DIAGNOSIS — Z882 Allergy status to sulfonamides status: Secondary | ICD-10-CM

## 2023-01-05 DIAGNOSIS — Z833 Family history of diabetes mellitus: Secondary | ICD-10-CM

## 2023-01-05 DIAGNOSIS — E119 Type 2 diabetes mellitus without complications: Secondary | ICD-10-CM

## 2023-01-05 DIAGNOSIS — Z885 Allergy status to narcotic agent status: Secondary | ICD-10-CM

## 2023-01-05 DIAGNOSIS — N2581 Secondary hyperparathyroidism of renal origin: Secondary | ICD-10-CM | POA: Diagnosis present

## 2023-01-05 DIAGNOSIS — R931 Abnormal findings on diagnostic imaging of heart and coronary circulation: Secondary | ICD-10-CM

## 2023-01-05 DIAGNOSIS — E1143 Type 2 diabetes mellitus with diabetic autonomic (poly)neuropathy: Secondary | ICD-10-CM | POA: Diagnosis present

## 2023-01-05 DIAGNOSIS — D631 Anemia in chronic kidney disease: Secondary | ICD-10-CM | POA: Diagnosis present

## 2023-01-05 DIAGNOSIS — N186 End stage renal disease: Secondary | ICD-10-CM | POA: Diagnosis present

## 2023-01-05 DIAGNOSIS — Z79899 Other long term (current) drug therapy: Secondary | ICD-10-CM

## 2023-01-05 DIAGNOSIS — K3184 Gastroparesis: Secondary | ICD-10-CM

## 2023-01-05 DIAGNOSIS — D72829 Elevated white blood cell count, unspecified: Secondary | ICD-10-CM | POA: Diagnosis present

## 2023-01-05 DIAGNOSIS — J189 Pneumonia, unspecified organism: Secondary | ICD-10-CM | POA: Diagnosis not present

## 2023-01-05 DIAGNOSIS — R112 Nausea with vomiting, unspecified: Secondary | ICD-10-CM | POA: Diagnosis not present

## 2023-01-05 DIAGNOSIS — Z992 Dependence on renal dialysis: Secondary | ICD-10-CM

## 2023-01-05 DIAGNOSIS — E1122 Type 2 diabetes mellitus with diabetic chronic kidney disease: Secondary | ICD-10-CM | POA: Diagnosis present

## 2023-01-05 DIAGNOSIS — I132 Hypertensive heart and chronic kidney disease with heart failure and with stage 5 chronic kidney disease, or end stage renal disease: Secondary | ICD-10-CM | POA: Diagnosis present

## 2023-01-05 DIAGNOSIS — I272 Pulmonary hypertension, unspecified: Secondary | ICD-10-CM | POA: Diagnosis present

## 2023-01-05 DIAGNOSIS — R7989 Other specified abnormal findings of blood chemistry: Secondary | ICD-10-CM | POA: Diagnosis not present

## 2023-01-05 DIAGNOSIS — I420 Dilated cardiomyopathy: Secondary | ICD-10-CM

## 2023-01-05 DIAGNOSIS — R5381 Other malaise: Secondary | ICD-10-CM

## 2023-01-05 DIAGNOSIS — Y95 Nosocomial condition: Secondary | ICD-10-CM | POA: Diagnosis present

## 2023-01-05 DIAGNOSIS — Z1152 Encounter for screening for COVID-19: Secondary | ICD-10-CM

## 2023-01-05 DIAGNOSIS — I493 Ventricular premature depolarization: Secondary | ICD-10-CM | POA: Diagnosis present

## 2023-01-05 DIAGNOSIS — I10 Essential (primary) hypertension: Secondary | ICD-10-CM

## 2023-01-05 DIAGNOSIS — I5043 Acute on chronic combined systolic (congestive) and diastolic (congestive) heart failure: Secondary | ICD-10-CM | POA: Diagnosis present

## 2023-01-05 DIAGNOSIS — H543 Unqualified visual loss, both eyes: Secondary | ICD-10-CM | POA: Diagnosis present

## 2023-01-05 DIAGNOSIS — K8020 Calculus of gallbladder without cholecystitis without obstruction: Secondary | ICD-10-CM | POA: Diagnosis present

## 2023-01-05 DIAGNOSIS — D638 Anemia in other chronic diseases classified elsewhere: Secondary | ICD-10-CM

## 2023-01-05 LAB — CBC WITH DIFFERENTIAL/PLATELET
Abs Immature Granulocytes: 0.02 10*3/uL (ref 0.00–0.07)
Basophils Absolute: 0 10*3/uL (ref 0.0–0.1)
Basophils Relative: 1 %
Eosinophils Absolute: 0 10*3/uL (ref 0.0–0.5)
Eosinophils Relative: 1 %
HCT: 32.7 % — ABNORMAL LOW (ref 39.0–52.0)
Hemoglobin: 10.1 g/dL — ABNORMAL LOW (ref 13.0–17.0)
Immature Granulocytes: 0 %
Lymphocytes Relative: 7 %
Lymphs Abs: 0.4 10*3/uL — ABNORMAL LOW (ref 0.7–4.0)
MCH: 28.7 pg (ref 26.0–34.0)
MCHC: 30.9 g/dL (ref 30.0–36.0)
MCV: 92.9 fL (ref 80.0–100.0)
Monocytes Absolute: 0.6 10*3/uL (ref 0.1–1.0)
Monocytes Relative: 10 %
Neutro Abs: 4.6 10*3/uL (ref 1.7–7.7)
Neutrophils Relative %: 81 %
Platelets: 125 10*3/uL — ABNORMAL LOW (ref 150–400)
RBC: 3.52 MIL/uL — ABNORMAL LOW (ref 4.22–5.81)
RDW: 14.8 % (ref 11.5–15.5)
WBC: 5.6 10*3/uL (ref 4.0–10.5)
nRBC: 0 % (ref 0.0–0.2)

## 2023-01-05 LAB — COMPREHENSIVE METABOLIC PANEL
ALT: 16 U/L (ref 0–44)
AST: 15 U/L (ref 15–41)
Albumin: 3.9 g/dL (ref 3.5–5.0)
Alkaline Phosphatase: 47 U/L (ref 38–126)
Anion gap: 16 — ABNORMAL HIGH (ref 5–15)
BUN: 29 mg/dL — ABNORMAL HIGH (ref 6–20)
CO2: 21 mmol/L — ABNORMAL LOW (ref 22–32)
Calcium: 9 mg/dL (ref 8.9–10.3)
Chloride: 97 mmol/L — ABNORMAL LOW (ref 98–111)
Creatinine, Ser: 8.51 mg/dL — ABNORMAL HIGH (ref 0.61–1.24)
GFR, Estimated: 7 mL/min — ABNORMAL LOW (ref >60)
Glucose, Bld: 161 mg/dL — ABNORMAL HIGH (ref 70–99)
Potassium: 3.9 mmol/L (ref 3.5–5.1)
Sodium: 134 mmol/L — ABNORMAL LOW (ref 135–145)
Total Bilirubin: 0.8 mg/dL (ref 0.3–1.2)
Total Protein: 7.4 g/dL (ref 6.5–8.1)

## 2023-01-05 LAB — PROTIME-INR
INR: 1.3 — ABNORMAL HIGH (ref 0.8–1.2)
Prothrombin Time: 16.4 s — ABNORMAL HIGH (ref 11.4–15.2)

## 2023-01-05 LAB — TROPONIN I (HIGH SENSITIVITY)
Troponin I (High Sensitivity): 44 ng/L — ABNORMAL HIGH (ref <18)
Troponin I (High Sensitivity): 59 ng/L — ABNORMAL HIGH (ref <18)

## 2023-01-05 LAB — SARS CORONAVIRUS 2 BY RT PCR: SARS Coronavirus 2 by RT PCR: NEGATIVE

## 2023-01-05 LAB — I-STAT CG4 LACTIC ACID, ED
Lactic Acid, Venous: 1.7 mmol/L (ref 0.5–1.9)
Lactic Acid, Venous: 1.5 mmol/L (ref 0.5–1.9)

## 2023-01-05 LAB — LIPASE, BLOOD: Lipase: 18 U/L (ref 11–51)

## 2023-01-05 MED ORDER — ACETAMINOPHEN 650 MG RE SUPP: 650.0000 mg | Freq: Once | RECTAL | Status: DC

## 2023-01-05 MED ORDER — SODIUM CHLORIDE 0.9 % IV SOLN
1.0000 g | INTRAVENOUS | Status: DC
  Administered 2023-01-05: 1 g via INTRAVENOUS
  Filled 2023-01-05: qty 10

## 2023-01-05 MED ORDER — METOCLOPRAMIDE HCL 5 MG/ML IJ SOLN
10.0000 mg | Freq: Once | INTRAMUSCULAR | Status: DC
  Filled 2023-01-05: qty 2

## 2023-01-05 MED ORDER — HYDROMORPHONE HCL 1 MG/ML IJ SOLN
0.5000 mg | Freq: Once | INTRAMUSCULAR | Status: DC
  Filled 2023-01-05: qty 1

## 2023-01-05 MED ORDER — SODIUM CHLORIDE 0.9 % IV SOLN
500.0000 mg | INTRAVENOUS | Status: DC
  Administered 2023-01-05: 500 mg via INTRAVENOUS
  Filled 2023-01-05: qty 5

## 2023-01-05 MED ORDER — ACETAMINOPHEN 325 MG PO TABS
650.0000 mg | ORAL_TABLET | Freq: Once | ORAL | Status: AC
  Administered 2023-01-05: 650 mg via ORAL
  Filled 2023-01-05: qty 2

## 2023-01-05 MED ORDER — HYDROMORPHONE HCL 1 MG/ML IJ SOLN
0.5000 mg | Freq: Once | INTRAMUSCULAR | Status: AC
  Administered 2023-01-05: 0.5 mg via INTRAVENOUS

## 2023-01-05 MED ORDER — LACTATED RINGERS IV SOLN: INTRAVENOUS | Status: DC

## 2023-01-05 MED ORDER — PROMETHAZINE HCL 25 MG/ML IJ SOLN: 25.0000 mg | Freq: Four times a day (QID) | INTRAMUSCULAR | Status: DC | PRN

## 2023-01-05 MED ORDER — METOCLOPRAMIDE HCL 5 MG/ML IJ SOLN
10.0000 mg | Freq: Once | INTRAMUSCULAR | Status: AC
  Administered 2023-01-05: 10 mg via INTRAVENOUS

## 2023-01-05 NOTE — ED Triage Notes (Signed)
Pt endorses n/v x 3 days; hx gastroparesis; hypertensive; received full dialysis treatment today; endorses cp, sob

## 2023-01-05 NOTE — ED Provider Notes (Signed)
[place] EMERGENCY DEPARTMENT AT [facility] Provider Note   CSN: [id number] Arrival date & time: 01/05/23  1643     History  No chief complaint on file.   [name] is a 47 y.o. male.  47 year old male who has history of end-stage renal disease as well as diabetic gastroparesis who presents with 2 days of emesis.  Patient cannot keep his medications down.  Also notes abdominal pain consistent with his prior gastroparesis.  Does make urine but denies any dysuria.  Some URI symptoms with a cough.  Endorses diffuse weakness.  Symptoms unresponsive to his home medications       Home Medications Prior to Admission medications   Medication Sig Start Date End Date Taking? Authorizing Provider  amLODipine (NORVASC) 10 MG tablet Take by mouth daily.    [provider]  calcitRIOL (ROCALTROL) 0.5 MCG capsule Take 0.5 mcg by mouth daily.    [provider]  carvedilol (COREG) 25 MG tablet Take 25 mg by mouth 2 (two) times daily with a meal.    [provider]  doxazosin (CARDURA) 2 MG tablet Take 2 mg by mouth at bedtime.    [provider]  metoCLOPramide (REGLAN) 5 MG tablet Take 5 mg by mouth 4 (four) times daily.    [provider]  pantoprazole (PROTONIX) 40 MG tablet Take 1 tablet (40 mg total) by mouth 2 (two) times daily before a meal. 12/22/22   Imogene Burn, MD      Allergies    Morphine and Sulfa antibiotics    Review of Systems   Review of Systems  All other systems reviewed and are negative.   Physical Exam Updated Vital Signs BP (!) 177/107   Pulse 95   Temp (!) 102.6 F (39.2 C) (Oral)   Resp 13   SpO2 93%  Physical Exam Vitals and nursing note reviewed.  Constitutional:      General: He is not in acute distress.    Appearance: Normal appearance. He is well-developed. He is not toxic-appearing.  HENT:     Head: Normocephalic and atraumatic.  Eyes:     General: Lids are normal.      Conjunctiva/sclera: Conjunctivae normal.     Pupils: Pupils are equal, round, and reactive to light.  Neck:     Thyroid: No thyroid mass.     Trachea: No tracheal deviation.  Cardiovascular:     Rate and Rhythm: Normal rate and regular rhythm.     Heart sounds: Normal heart sounds. No murmur heard.    No gallop.  Pulmonary:     Effort: Pulmonary effort is normal. No respiratory distress.     Breath sounds: Normal breath sounds. No stridor. No decreased breath sounds, wheezing, rhonchi or rales.  Abdominal:     General: There is no distension.     Palpations: Abdomen is soft.     Tenderness: There is no abdominal tenderness. There is no guarding or rebound.    Musculoskeletal:        General: No tenderness. Normal range of motion.     Cervical back: Normal range of motion and neck supple.  Skin:    General: Skin is warm and dry.     Findings: No abrasion or rash.  Neurological:     Mental Status: He is alert and oriented to person, place, and time. Mental status is at baseline.     GCS: GCS eye subscore is 4. GCS verbal subscore is  5. GCS motor subscore is 6.     Cranial Nerves: Cranial nerves are intact. No cranial nerve deficit.     Sensory: No sensory deficit.     Motor: Motor function is intact.  Psychiatric:        Attention and Perception: Attention normal.        Speech: Speech normal.        Behavior: Behavior normal.     ED Results / Procedures / Treatments   Labs (all labs ordered are listed, but only abnormal results are displayed) Labs Reviewed  COMPREHENSIVE METABOLIC PANEL - Abnormal; Notable for the following components:      Result Value   Sodium 134 (*)    Chloride 97 (*)    CO2 21 (*)    Glucose, Bld 161 (*)    BUN 29 (*)    Creatinine, Ser 8.51 (*)    GFR, Estimated 7 (*)    Anion gap 16 (*)    All other components within normal limits  CBC WITH DIFFERENTIAL/PLATELET - Abnormal; Notable for the following components:   RBC 3.52 (*)    Hemoglobin  10.1 (*)    HCT 32.7 (*)    Platelets 125 (*)    Lymphs Abs 0.4 (*)    All other components within normal limits  PROTIME-INR - Abnormal; Notable for the following components:   Prothrombin Time 16.4 (*)    INR 1.3 (*)    All other components within normal limits  TROPONIN I (HIGH SENSITIVITY) - Abnormal; Notable for the following components:   Troponin I (High Sensitivity) 44 (*)    All other components within normal limits  CULTURE, BLOOD (ROUTINE X 2)  CULTURE, BLOOD (ROUTINE X 2)  SARS CORONAVIRUS 2 BY RT PCR  URINALYSIS, W/ REFLEX TO CULTURE (INFECTION SUSPECTED)  LIPASE, BLOOD  I-STAT CG4 LACTIC ACID, ED  I-STAT CG4 LACTIC ACID, ED  TROPONIN I (HIGH SENSITIVITY)    EKG EKG Interpretation Date/Time:  Thursday January 05 2023 16:53:09 EDT Ventricular Rate:  93 PR Interval:  144 QRS Duration:  76 QT Interval:  388 QTC Calculation: 482 R Axis:   91  Text Interpretation: Sinus rhythm with occasional Premature ventricular complexes Rightward axis Nonspecific ST abnormality Prolonged QT Abnormal ECG When compared with ECG of 21-Sep-2007 05:51, PREVIOUS ECG IS PRESENT No significant change since last tracing Confirmed by Lorre Nick ([id number]) on 01/05/2023 6:29:10 PM  Radiology No results found.  Procedures Procedures    Medications Ordered in ED Medications  acetaminophen (TYLENOL) suppository 650 mg (has no administration in time range)  promethazine (PHENERGAN) injection 25 mg (has no administration in time range)    ED Course/ Medical Decision Making/ A&P                                 Medical Decision Making Amount and/or Complexity of Data Reviewed Labs: ordered. Radiology: ordered.  Risk OTC drugs. Prescription drug management.   Patient is EKG shows normal sinus rhythm with occasional PVCs.  He is febrile here.  Chest x-ray shows right lower lobe infiltrate.  Patient abdominal pain and abdominal CT which per my interpretation shows a significant  right sided pneumonia.  Patient started on IV antibiotics for commune acquired pneumonia.  Patient's lactate is normal here.  His COVID is negative as well to.  Do not feel that he is septic.  Patient's troponins are slightly bumped but this could  be from his renal disease versus demand ischemia.  Patient was dialyzed today.  His potassium is within normal limits.  Discussed case with hospitalist will admit.  Also informed patient's wife who is at bedside.  CRITICAL CARE Performed by: Toy Baker Total critical care time: 50 minutes Critical care time was exclusive of separately billable procedures and treating other patients. Critical care was necessary to treat or prevent imminent or life-threatening deterioration. Critical care was time spent personally by me on the following activities: development of treatment plan with patient and/or surrogate as well as nursing, discussions with consultants, evaluation of patient's response to treatment, examination of patient, obtaining history from patient or surrogate, ordering and performing treatments and interventions, ordering and review of laboratory studies, ordering and review of radiographic studies, pulse oximetry and re-evaluation of patient's condition.         Final Clinical Impression(s) / ED Diagnoses Final diagnoses:  None    Rx / DC Orders ED Discharge Orders     None         Lorre Nick, MD 01/05/23 2135

## 2023-01-05 NOTE — H&P (Signed)
[name] UXL:[id number] DOB: 10/07/1975 DOA: 01/05/2023     PCP: Starla Link, MD   Outpatient Specialists:      NEphrology:  Dr. Starla Link, MD   LB Lupe Carney Patient arrived to ER on 01/05/23 at 1643 Referred by Attending Lorre Nick, MD   Patient coming from:    home Lives With family    Chief Complaint: Cough   HPI: [name] is a 47 y.o. male with medical history significant of end-stage renal disease gastroparesis, hypertension, DM2    Presented with   Feeling sick and coughing for the past 3 days fever today up to 101 Presents with fever N/V hx of Gastroparesis  Febrile to 101.2 Hx of ESRD last HD was today Covid neg Started on IV abx Lactic acid wnl       Denies significant ETOH intake   Does not smoke   Lab Results  Component Value Date   SARSCOV2NAA NEGATIVE 01/05/2023        Regarding pertinent Chronic problems:      HTN on Norvasc Coreg Cardura      DM 2 -  Lab Results  Component Value Date   HGBA1C (H) 09/20/2007    14.1 (NOTE)   The ADA recommends the following therapeutic goals for glycemic   control related to Hgb A1C measurement:   Goal of Therapy:   < 7.0% Hgb A1C   Action Suggested:  > 8.0% Hgb A1C   Ref:  Diabetes Care, 22, Suppl. 1, 1999    diet controlled End-stage renal disease hemodialysis Tuesday Thursday Saturday  Lab Results  Component Value Date   CREATININE 8.51 (H) 01/05/2023   CREATININE 9.50 (H) 12/22/2022   CREATININE 0.9 09/20/2007   Lab Results  Component Value Date   NA 134 (L) 01/05/2023   CL 97 (L) 01/05/2023   K 3.9 01/05/2023   CO2 21 (L) 01/05/2023   BUN 29 (H) 01/05/2023   CREATININE 8.51 (H) 01/05/2023   GFRNONAA 7 (L) 01/05/2023   CALCIUM 9.0 01/05/2023   ALBUMIN 3.9 01/05/2023   GLUCOSE 161 (H) 01/05/2023       While in ER:     Found to have evidence of pneumonia Started on Rocephin azithromycin was given a dose of Dilaudid resulting in sedation   Lab Orders          Culture, blood (Routine x 2)         SARS Coronavirus 2 by RT PCR (hospital order, performed in [facility] hospital lab) *cepheid single result test* Anterior Nasal Swab         Comprehensive metabolic panel         CBC with Differential         Protime-INR         Urinalysis, w/ Reflex to Culture (Infection Suspected) -Urine, Clean Catch         Lipase, blood         I-Stat Lactic Acid, ED       CXR -right middle lobe pneumonia  CTabd/pelvis - Moderate to marked severity right middle lobe infiltrate. 2. Small right pleural effusion. 3. Cholelithiasis. 4. Moderate severity diffuse urinary bladder wall thickening which may be secondary to chronic bladder outlet obstruction. Correlation with urinalysis is recommended to exclude the presence of acute cystitis. 5. Aortic atherosclerosis.     Following Medications were ordered in ER: Medications  lactated ringers infusion ( Intravenous New Bag/Given 01/05/23 1927)  azithromycin (ZITHROMAX) 500  mg in sodium chloride 0.9 % 250 mL IVPB (500 mg Intravenous New Bag/Given 01/05/23 2103)  cefTRIAXone (ROCEPHIN) 1 g in sodium chloride 0.9 % 100 mL IVPB (0 g Intravenous Stopped 01/05/23 2103)  HYDROmorphone (DILAUDID) injection 0.5 mg (0.5 mg Intravenous Given 01/05/23 1913)  metoCLOPramide (REGLAN) injection 10 mg (10 mg Intravenous Given 01/05/23 1912)  acetaminophen (TYLENOL) tablet 650 mg (650 mg Oral Given 01/05/23 1926)    _______________________________________________________ ER Provider Called:     Nephrolgy  Dr. Valentino Nose They Recommend admit to medicine   Will see in AM       ED Triage Vitals  Encounter Vitals Group     BP 01/05/23 1654 (!) 183/103     Systolic BP Percentile --      Diastolic BP Percentile --      Pulse Rate 01/05/23 1654 94     Resp 01/05/23 1654 19     Temp 01/05/23 1654 (!) 102.6 F (39.2 C)     Temp Source 01/05/23 1654 Oral     SpO2 01/05/23 1654 94 %     Weight --      Height --      Head  Circumference --      Peak Flow --      Pain Score 01/05/23 1657 6     Pain Loc --      Pain Education --      Exclude from Growth Chart --   ZOXW(96)@     _________________________________________ Significant initial  Findings: Abnormal Labs Reviewed  COMPREHENSIVE METABOLIC PANEL - Abnormal; Notable for the following components:      Result Value   Sodium 134 (*)    Chloride 97 (*)    CO2 21 (*)    Glucose, Bld 161 (*)    BUN 29 (*)    Creatinine, Ser 8.51 (*)    GFR, Estimated 7 (*)    Anion gap 16 (*)    All other components within normal limits  CBC WITH DIFFERENTIAL/PLATELET - Abnormal; Notable for the following components:   RBC 3.52 (*)    Hemoglobin 10.1 (*)    HCT 32.7 (*)    Platelets 125 (*)    Lymphs Abs 0.4 (*)    All other components within normal limits  PROTIME-INR - Abnormal; Notable for the following components:   Prothrombin Time 16.4 (*)    INR 1.3 (*)    All other components within normal limits  TROPONIN I (HIGH SENSITIVITY) - Abnormal; Notable for the following components:   Troponin I (High Sensitivity) 44 (*)    All other components within normal limits  TROPONIN I (HIGH SENSITIVITY) - Abnormal; Notable for the following components:   Troponin I (High Sensitivity) 59 (*)    All other components within normal limits      _________________________ Troponin  ordered Cardiac Panel (last 3 results) Recent Labs    01/05/23 1717 01/05/23 1903  TROPONINIHS 44* 59*     ECG: Ordered Personally reviewed and interpreted by me showing: HR : 93 Rhythm:Sinus rhythm with occasional Premature ventricular complexes Rightward axis Nonspecific ST abnormality   Abnormal ECG QTC 482    Lab Results  Component Value Date   SARSCOV2NAA NEGATIVE 01/05/2023      WBC     Component Value Date/Time   WBC 5.6 01/05/2023 1717   LYMPHSABS 0.4 (L) 01/05/2023 1717   MONOABS 0.6 01/05/2023 1717   EOSABS 0.0 01/05/2023 1717   BASOSABS 0.0 01/05/2023  1717  Lactic Acid, Venous    Component Value Date/Time   LATICACIDVEN 1.5 01/05/2023 2009     Procalcitonin   Ordered      Results for orders placed or performed during the hospital encounter of 01/05/23  SARS Coronavirus 2 by RT PCR (hospital order, performed in [facility] hospital lab) *cepheid single result test* Anterior Nasal Swab     Status: None   Collection Time: 01/05/23  8:01 PM   Specimen: Anterior Nasal Swab  Result Value Ref Range Status   SARS Coronavirus 2 by RT PCR NEGATIVE NEGATIVE Final    Comment: Performed at [facility], [address], [place], [place] [address]    ABX started Antibiotics Given (last 72 hours)     Date/Time Action Medication Dose Rate   01/05/23 2031 New Bag/Given   cefTRIAXone (ROCEPHIN) 1 g in sodium chloride 0.9 % 100 mL IVPB 1 g 200 mL/hr   01/05/23 2103 New Bag/Given   azithromycin (ZITHROMAX) 500 mg in sodium chloride 0.9 % 250 mL IVPB 500 mg 250 mL/hr     Vbg ordered __________________________________________________________ Recent Labs  Lab 01/05/23 1717  NA 134*  K 3.9  CO2 21*  GLUCOSE 161*  BUN 29*  CREATININE 8.51*  CALCIUM 9.0    Cr   stable  Lab Results  Component Value Date   CREATININE 8.51 (H) 01/05/2023   CREATININE 9.50 (H) 12/22/2022   CREATININE 0.9 09/20/2007    Recent Labs  Lab 01/05/23 1717  AST 15  ALT 16  ALKPHOS 47  BILITOT 0.8  PROT 7.4  ALBUMIN 3.9   Lab Results  Component Value Date   CALCIUM 9.0 01/05/2023    Plt: Lab Results  Component Value Date   PLT 125 (L) 01/05/2023       Recent Labs  Lab 01/05/23 1717  WBC 5.6  NEUTROABS 4.6  HGB 10.1*  HCT 32.7*  MCV 92.9  PLT 125*    HG/HCT  stable     Component Value Date/Time   HGB 10.1 (L) 01/05/2023 1717   HCT 32.7 (L) 01/05/2023 1717   MCV 92.9 01/05/2023 1717     Recent Labs  Lab 01/05/23 1903  LIPASE 18    _______________________________________________ Hospitalist was called for admission for   CAP/ nausea and vomiting   The following Work up has been ordered so far:  Orders Placed This Encounter  Procedures   Culture, blood (Routine x 2)   SARS Coronavirus 2 by RT PCR (hospital order, performed in [facility] hospital lab) *cepheid single result test* Anterior Nasal Swab   DG Chest 2 View   CT ABDOMEN PELVIS WO CONTRAST   Comprehensive metabolic panel   CBC with Differential   Protime-INR   Urinalysis, w/ Reflex to Culture (Infection Suspected) -Urine, Clean Catch   Lipase, blood   Notify physician (specify)  Specify: Notify provider for possible Code Sepsis   Document height and weight   Consult to hospitalist   Airborne and Contact precautions   I-Stat Lactic Acid, ED   EKG 12-Lead   ED EKG     OTHER Significant initial  Findings:  labs showing:     DM  labs:  HbA1C: No results for input(s): "HGBA1C" in the last 8760 hours.     CBG (last 3)  No results for input(s): "GLUCAP" in the last 72 hours.        Cultures: No results found for: "SDES", "SPECREQUEST", "CULT", "REPTSTATUS"   Radiological Exams on  Admission: CT ABDOMEN PELVIS WO CONTRAST  Result Date: 01/05/2023 CLINICAL DATA:  Abdominal pain. EXAM: CT ABDOMEN AND PELVIS WITHOUT CONTRAST TECHNIQUE: Multidetector CT imaging of the abdomen and pelvis was performed following the standard protocol without IV contrast. RADIATION DOSE REDUCTION: This exam was performed according to the departmental dose-optimization program which includes automated exposure control, adjustment of the mA and/or kV according to patient size and/or use of iterative reconstruction technique. COMPARISON:  None Available. FINDINGS: Lower chest: There is mild cardiomegaly with moderate to marked severity right middle lobe infiltrate. A small right pleural effusion is seen. Hepatobiliary: No focal liver abnormality is seen. There is a mild amount of perihepatic fluid. Multiple tiny gallstones are seen within the dependent portion of  the gallbladder lumen. Pancreas: Unremarkable. No pancreatic ductal dilatation or surrounding inflammatory changes. Spleen: Normal in size without focal abnormality. Adrenals/Urinary Tract: Adrenal glands are unremarkable. Kidneys are normal, without obstructing renal calculi, focal lesion, or hydronephrosis. Renal vascular calcifications are noted. The urinary bladder is poorly distended and subsequently limited in evaluation. Moderate severity diffuse urinary bladder wall thickening is noted. Stomach/Bowel: Stomach is within normal limits. Appendix appears normal. No evidence of bowel wall thickening, distention, or inflammatory changes. Vascular/Lymphatic: Aortic atherosclerosis. No enlarged abdominal or pelvic lymph nodes. Reproductive: Prostate is unremarkable. Other: No abdominal wall hernia or abnormality. No abdominopelvic ascites. Musculoskeletal: No acute or significant osseous findings. IMPRESSION: 1. Moderate to marked severity right middle lobe infiltrate. 2. Small right pleural effusion. 3. Cholelithiasis. 4. Moderate severity diffuse urinary bladder wall thickening which may be secondary to chronic bladder outlet obstruction. Correlation with urinalysis is recommended to exclude the presence of acute cystitis. 5. Aortic atherosclerosis. Aortic Atherosclerosis (ICD10-I70.0). Electronically Signed   By: Aram Candela M.D.   On: 01/05/2023 21:09   DG Chest 2 View  Result Date: 01/05/2023 CLINICAL DATA:  Suspect sepsis EXAM: CHEST - 2 VIEW COMPARISON:  Chest x-ray 09/12/2007 FINDINGS: There is airspace disease in the right middle lobe. The cardiomediastinal silhouette is within normal limits. No pleural effusion or pneumothorax. No acute fractures. IMPRESSION: Right middle lobe pneumonia. Follow-up imaging recommended in 4-6 weeks to confirm resolution. Electronically Signed   By: Darliss Cheney M.D.   On: 01/05/2023 19:47    _______________________________________________________________________________________________________ Latest  Blood pressure (!) 150/88, pulse 88, temperature (!) 101.2 F (38.4 C), temperature source Oral, resp. rate (!) 23, SpO2 96%.   Vitals  labs and radiology finding personally reviewed  Review of Systems:    Pertinent positives include:  Fevers, chills, fatigue,  productive cough, Constitutional:  No weight loss, night sweats, weight loss dyspnea on exertion HEENT:  No headaches, Difficulty swallowing,Tooth/dental problems,Sore throat,  No sneezing, itching, ear ache, nasal congestion, post nasal drip,  Cardio-vascular:  No chest pain, Orthopnea, PND, anasarca, dizziness, palpitations.no Bilateral lower extremity swelling  GI:  No heartburn, indigestion, abdominal pain, nausea, vomiting, diarrhea, change in bowel habits, loss of appetite, melena, blood in stool, hematemesis Resp:  no shortness of breath at rest. No , No excess mucus, no  No non-productive cough, No coughing up of blood.No change in color of mucus.No wheezing. Skin:  no rash or lesions. No jaundice GU:  no dysuria, change in color of urine, no urgency or frequency. No straining to urinate.  No flank pain.  Musculoskeletal:  No joint pain or no joint swelling. No decreased range of motion. No back pain.  Psych:  No change in mood or affect. No depression or anxiety. No memory loss.  Neuro:  no localizing neurological complaints, no tingling, no weakness, no double vision, no gait abnormality, no slurred speech, no confusion  All systems reviewed and apart from HOPI all are negative _______________________________________________________________________________________________ Past Medical History:   Past Medical History:  Diagnosis Date   ESRD (end stage renal disease) (HCC)    Hypertension      Past Surgical History:  Procedure Laterality Date   BIOPSY  12/22/2022   Procedure: BIOPSY;  Surgeon:  Imogene Burn, MD;  Location: [facility];  Service: Gastroenterology;;   COLONOSCOPY WITH PROPOFOL N/A 12/22/2022   Procedure: COLONOSCOPY WITH PROPOFOL;  Surgeon: Imogene Burn, MD;  Location: [facility];  Service: Gastroenterology;  Laterality: N/A;   ESOPHAGOGASTRODUODENOSCOPY (EGD) WITH PROPOFOL N/A 12/22/2022   Procedure: ESOPHAGOGASTRODUODENOSCOPY (EGD) WITH PROPOFOL;  Surgeon: Imogene Burn, MD;  Location: [facility];  Service: Gastroenterology;  Laterality: N/A;   EYE SURGERY     POLYPECTOMY  12/22/2022   Procedure: POLYPECTOMY;  Surgeon: Imogene Burn, MD;  Location: [facility];  Service: Gastroenterology;;    Social History:  Ambulatory   independently       reports that he has never smoked. He has never used smokeless tobacco. No history on file for alcohol use and drug use.   Family History:  Family History  Problem Relation Age of Onset   Diabetes Mother    Lung disease Father    Liver disease Neg Hx    Esophageal cancer Neg Hx    Colon cancer Neg Hx    ______________________________________________________________________________________________ Allergies: Allergies  Allergen Reactions   Morphine Swelling   Sulfa Antibiotics Swelling     Prior to Admission medications   Medication Sig Start Date End Date Taking? Authorizing Provider  amLODipine (NORVASC) 10 MG tablet Take by mouth daily.    [provider]  calcitRIOL (ROCALTROL) 0.5 MCG capsule Take 0.5 mcg by mouth daily.    [provider]  carvedilol (COREG) 25 MG tablet Take 25 mg by mouth 2 (two) times daily with a meal.    [provider]  doxazosin (CARDURA) 2 MG tablet Take 2 mg by mouth at bedtime.    [provider]  metoCLOPramide (REGLAN) 5 MG tablet Take 5 mg by mouth 4 (four) times daily.    [provider]  pantoprazole (PROTONIX) 40 MG tablet Take 1 tablet (40 mg total) by mouth 2 (two) times daily before a meal. 12/22/22   Imogene Burn, MD     ___________________________________________________________________________________________________ Physical Exam:    01/05/2023    9:00 PM 01/05/2023    8:49 PM 01/05/2023    8:04 PM  Vitals with BMI  Systolic 150 146 161  Diastolic 88 84 101  Pulse 88 89 95    1. General:  in No  Acute distress   Chronically ill-appearing 2. Psychological: Alert and   Oriented 3. Head/ENT:    Dry Mucous Membranes                          Head Non traumatic, neck supple                          Poor Dentition 4. SKIN: decreased Skin turgor,  Skin clean Dry and intact no rash    5. Heart: Regular rate and rhythm no  Murmur, no Rub or gallop 6. Lungs:  no wheezes or crackles   7. Abdomen: Soft,  non-tender, Non  distended  bowel sounds present 8. Lower extremities: no clubbing, cyanosis, no  edema 9. Neurologically Grossly intact, moving all 4 extremities equally   10. MSK: Normal range of motion    Chart has been reviewed  __________ ______________________  Assessment/Plan 47 y.o. male with medical history significant of end-stage renal disease gastroparesis, hypertension, DM2  Admitted for  CAP, elevated troponin   Present on Admission:  CAP (community acquired pneumonia)  Nausea & vomiting  Elevated troponin  ESRD (end stage renal disease) (HCC)    CAP (community acquired pneumonia)  - -Patient presenting with  productive cough, fever     -Infiltrate on CXR and 2-3 characteristics (fever, leukocytosis, purulent sputum) are consistent with pneumonia. -This appears to be most likely community-acquired pneumonia.   -   will admit for treatment of CAP will start on appropriate antibiotic coverage. - Rocephin/azithromycin   Obtain:  sputum cultures,                  Obtain respiratory panel                    COVID PCR negative                    blood cultures and sputum cultures ordered                   strep pneumo UA antigen,                  check for Legionella antigen.                 Provide oxygen as needed.    Nausea & vomiting Continue Reglan patient has history of gastroparesis Continue Protonix. Denies any diarrhea CT abdomen unremarkable nonacute No elevation of LFTs  Elevated troponin In the setting of renal failure Likely demand in the setting of pneumonia Continue serial EKGs continue to cycle cardiac enzymes obtain echo in the morning for completion  ESRD (end stage renal disease) (HCC) - -ER notified nephrology by msg that patient has been admitted    - usually gets dialysis on   Tuesday Thursday Saturday    -currently does not appear to be fluid overloaded   - no evidence of hyperkalemia       Other plan as per orders.  DVT prophylaxis:  SCD     Code Status:    Code Status: Not on file FULL CODE as per patient   I had personally discussed CODE STATUS with patient and family  ACP   none    Family Communication:   Family   at  Bedside  plan of care was discussed  with   Wife   Diet  clear   Disposition Plan:        To home once workup is complete and patient is stable   Following barriers for discharge:                                                                                          Afebrile, white count improving able to transition to PO antibiotics  Will need to be able to tolerate PO                                                       Will need consultants to evaluate patient prior to discharge       Consult Orders  (From admission, onward)           Start     Ordered   01/05/23 2119  Consult to hospitalist  Once       Provider:  (Not yet assigned)  Question Answer Comment  Place call to: [facility]   Reason for Consult Admit      01/05/23 2118             Consults called: Nephrology   Admission status:  ED Disposition     ED Disposition  Admit   Condition  --   Comment  Hospital Area: [facility] [[id number]]  Level of Care: Telemetry  Medical [[id number]]  May place patient in observation at [facility] or [facility] if equivalent level of care is available:: No  Covid Evaluation: Confirmed COVID Negative  Diagnosis: CAP (community acquired pneumonia) [[id number]]  Admitting Physician: Therisa Doyne [[id number]]  Attending Physician: Therisa Doyne [[id number]]          Obs       Level of care     tele  For 12H     Lab Results  Component Value Date   SARSCOV2NAA NEGATIVE 01/05/2023     Precautions: admitted as   Covid Negative   Chima Astorino 01/06/2023, 1:13 AM    [facility]     after 2 AM please page floor coverage PA If 7AM-7PM, please contact the day team taking care of the patient using [email or web address]

## 2023-01-05 NOTE — Subjective & Objective (Signed)
Presents with fever N/V hx of Gastroparesis  Febrile to 101.2 Hx of ESRD last HD was today Covid neg Started on IV abx Lactic acid wnl

## 2023-01-05 NOTE — ED Notes (Signed)
ED TO INPATIENT HANDOFF REPORT  ED Nurse Name and Phone #: Christean Leaf [phone]  S Name/Age/Gender [name] 47 y.o. male Room/Bed: TRAAC/TRAAC  Code Status   Code Status: Not on file  Home/SNF/Other Home Patient oriented to: self, place, time, and situation Is this baseline? Yes   Triage Complete: Triage complete  Chief Complaint CAP (community acquired pneumonia) [J18.9]  Triage Note Pt endorses n/v x 3 days; hx gastroparesis; hypertensive; received full dialysis treatment today; endorses cp, sob   Allergies Allergies  Allergen Reactions   Morphine Swelling   Sulfa Antibiotics Swelling    Level of Care/Admitting Diagnosis ED Disposition     ED Disposition  Admit   Condition  --   Comment  Hospital Area: [facility] [[id number]]  Level of Care: Telemetry Medical [[id number]]  May place patient in observation at [facility] or [facility] if equivalent level of care is available:: No  Covid Evaluation: Confirmed COVID Negative  Diagnosis: CAP (community acquired pneumonia) [086578]  Admitting Physician: Therisa Doyne [[id number]]  Attending Physician: Therisa Doyne [[id number]]          B Medical/Surgery History Past Medical History:  Diagnosis Date   ESRD (end stage renal disease) (HCC)    Hypertension    Past Surgical History:  Procedure Laterality Date   BIOPSY  12/22/2022   Procedure: BIOPSY;  Surgeon: Imogene Burn, MD;  Location: [facility];  Service: Gastroenterology;;   COLONOSCOPY WITH PROPOFOL N/A 12/22/2022   Procedure: COLONOSCOPY WITH PROPOFOL;  Surgeon: Imogene Burn, MD;  Location: [facility];  Service: Gastroenterology;  Laterality: N/A;   ESOPHAGOGASTRODUODENOSCOPY (EGD) WITH PROPOFOL N/A 12/22/2022   Procedure: ESOPHAGOGASTRODUODENOSCOPY (EGD) WITH PROPOFOL;  Surgeon: Imogene Burn, MD;  Location: [facility];  Service: Gastroenterology;  Laterality: N/A;   EYE SURGERY     POLYPECTOMY  12/22/2022   Procedure:  POLYPECTOMY;  Surgeon: Imogene Burn, MD;  Location: [facility];  Service: Gastroenterology;;     A IV Location/Drains/Wounds Patient Lines/Drains/Airways Status     Active Line/Drains/Airways     Name Placement date Placement time Site Days   Peripheral IV 12/22/22 20 G Left;Posterior Hand 12/22/22  1054  Hand  14   Peripheral IV 01/05/23 20 G Left;Posterior Forearm 01/05/23  1908  Forearm  less than 1            Intake/Output Last 24 hours No intake or output data in the 24 hours ending 01/05/23 2317  Labs/Imaging Results for orders placed or performed during the hospital encounter of 01/05/23 (from the past 48 hour(s))  Comprehensive metabolic panel     Status: Abnormal   Collection Time: 01/05/23  5:17 PM  Result Value Ref Range   Sodium 134 (L) 135 - 145 mmol/L   Potassium 3.9 3.5 - 5.1 mmol/L   Chloride 97 (L) 98 - 111 mmol/L   CO2 21 (L) 22 - 32 mmol/L   Glucose, Bld 161 (H) 70 - 99 mg/dL    Comment: Glucose reference range applies only to samples taken after fasting for at least 8 hours.   BUN 29 (H) 6 - 20 mg/dL   Creatinine, Ser 4.69 (H) 0.61 - 1.24 mg/dL   Calcium 9.0 8.9 - 62.9 mg/dL   Total Protein 7.4 6.5 - 8.1 g/dL   Albumin 3.9 3.5 - 5.0 g/dL   AST 15 15 - 41 U/L   ALT 16 0 - 44 U/L   Alkaline Phosphatase 47 38 - 126 U/L  Total Bilirubin 0.8 0.3 - 1.2 mg/dL   GFR, Estimated 7 (L) >60 mL/min    Comment: (NOTE) Calculated using the CKD-EPI Creatinine Equation (2021)    Anion gap 16 (H) 5 - 15    Comment: Performed at [facility], [address] [place], [place] [address]  CBC with Differential     Status: Abnormal   Collection Time: 01/05/23  5:17 PM  Result Value Ref Range   WBC 5.6 4.0 - 10.5 K/uL   RBC 3.52 (L) 4.22 - 5.81 MIL/uL   Hemoglobin 10.1 (L) 13.0 - 17.0 g/dL   HCT 60.4 (L) 54.0 - 98.1 %   MCV 92.9 80.0 - 100.0 fL   MCH 28.7 26.0 - 34.0 pg   MCHC 30.9 30.0 - 36.0 g/dL   RDW 19.1 47.8 - 29.5 %   Platelets 125 (L) 150 -  400 K/uL   nRBC 0.0 0.0 - 0.2 %   Neutrophils Relative % 81 %   Neutro Abs 4.6 1.7 - 7.7 K/uL   Lymphocytes Relative 7 %   Lymphs Abs 0.4 (L) 0.7 - 4.0 K/uL   Monocytes Relative 10 %   Monocytes Absolute 0.6 0.1 - 1.0 K/uL   Eosinophils Relative 1 %   Eosinophils Absolute 0.0 0.0 - 0.5 K/uL   Basophils Relative 1 %   Basophils Absolute 0.0 0.0 - 0.1 K/uL   Immature Granulocytes 0 %   Abs Immature Granulocytes 0.02 0.00 - 0.07 K/uL    Comment: Performed at [facility], [address], [place], [place] [address]  Protime-INR     Status: Abnormal   Collection Time: 01/05/23  5:17 PM  Result Value Ref Range   Prothrombin Time 16.4 (H) 11.4 - 15.2 seconds   INR 1.3 (H) 0.8 - 1.2    Comment: (NOTE) INR goal varies based on device and disease states. Performed at [facility], [address], [place], [place] [address]   Troponin I (High Sensitivity)     Status: Abnormal   Collection Time: 01/05/23  5:17 PM  Result Value Ref Range   Troponin I (High Sensitivity) 44 (H) <18 ng/L    Comment: (NOTE) Elevated high sensitivity troponin I (hsTnI) values and significant  changes across serial measurements may suggest ACS but many other  chronic and acute conditions are known to elevate hsTnI results.  Refer to the "Links" section for chest pain algorithms and additional  guidance. Performed at [facility], [address], [place], [place] [address]   I-Stat Lactic Acid, ED     Status: None   Collection Time: 01/05/23  5:26 PM  Result Value Ref Range   Lactic Acid, Venous 1.7 0.5 - 1.9 mmol/L  Troponin I (High Sensitivity)     Status: Abnormal   Collection Time: 01/05/23  7:03 PM  Result Value Ref Range   Troponin I (High Sensitivity) 59 (H) <18 ng/L    Comment: (NOTE) Elevated high sensitivity troponin I (hsTnI) values and significant  changes across serial measurements may suggest ACS but many other  chronic and acute conditions are known to elevate hsTnI  results.  Refer to the "Links" section for chest pain algorithms and additional  guidance. Performed at [facility], [address], [place], [place] [address]   Lipase, blood     Status: None   Collection Time: 01/05/23  7:03 PM  Result Value Ref Range   Lipase 18 11 - 51 U/L  Comment: Performed at [facility], [address], [place], [place] [address]  SARS Coronavirus 2 by RT PCR (hospital order, performed in [facility] hospital lab) *cepheid single result test* Anterior Nasal Swab     Status: None   Collection Time: 01/05/23  8:01 PM   Specimen: Anterior Nasal Swab  Result Value Ref Range   SARS Coronavirus 2 by RT PCR NEGATIVE NEGATIVE    Comment: Performed at [facility], [address] [place], [place] [address]  I-Stat Lactic Acid, ED     Status: None   Collection Time: 01/05/23  8:09 PM  Result Value Ref Range   Lactic Acid, Venous 1.5 0.5 - 1.9 mmol/L   CT ABDOMEN PELVIS WO CONTRAST  Result Date: 01/05/2023 CLINICAL DATA:  Abdominal pain. EXAM: CT ABDOMEN AND PELVIS WITHOUT CONTRAST TECHNIQUE: Multidetector CT imaging of the abdomen and pelvis was performed following the standard protocol without IV contrast. RADIATION DOSE REDUCTION: This exam was performed according to the departmental dose-optimization program which includes automated exposure control, adjustment of the mA and/or kV according to patient size and/or use of iterative reconstruction technique. COMPARISON:  None Available. FINDINGS: Lower chest: There is mild cardiomegaly with moderate to marked severity right middle lobe infiltrate. A small right pleural effusion is seen. Hepatobiliary: No focal liver abnormality is seen. There is a mild amount of perihepatic fluid. Multiple tiny gallstones are seen within the dependent portion of the gallbladder lumen. Pancreas: Unremarkable. No pancreatic ductal dilatation or surrounding inflammatory changes. Spleen: Normal in size without focal  abnormality. Adrenals/Urinary Tract: Adrenal glands are unremarkable. Kidneys are normal, without obstructing renal calculi, focal lesion, or hydronephrosis. Renal vascular calcifications are noted. The urinary bladder is poorly distended and subsequently limited in evaluation. Moderate severity diffuse urinary bladder wall thickening is noted. Stomach/Bowel: Stomach is within normal limits. Appendix appears normal. No evidence of bowel wall thickening, distention, or inflammatory changes. Vascular/Lymphatic: Aortic atherosclerosis. No enlarged abdominal or pelvic lymph nodes. Reproductive: Prostate is unremarkable. Other: No abdominal wall hernia or abnormality. No abdominopelvic ascites. Musculoskeletal: No acute or significant osseous findings. IMPRESSION: 1. Moderate to marked severity right middle lobe infiltrate. 2. Small right pleural effusion. 3. Cholelithiasis. 4. Moderate severity diffuse urinary bladder wall thickening which may be secondary to chronic bladder outlet obstruction. Correlation with urinalysis is recommended to exclude the presence of acute cystitis. 5. Aortic atherosclerosis. Aortic Atherosclerosis (ICD10-I70.0). Electronically Signed   By: Aram Candela M.D.   On: 01/05/2023 21:09   DG Chest 2 View  Result Date: 01/05/2023 CLINICAL DATA:  Suspect sepsis EXAM: CHEST - 2 VIEW COMPARISON:  Chest x-ray 09/12/2007 FINDINGS: There is airspace disease in the right middle lobe. The cardiomediastinal silhouette is within normal limits. No pleural effusion or pneumothorax. No acute fractures. IMPRESSION: Right middle lobe pneumonia. Follow-up imaging recommended in 4-6 weeks to confirm resolution. Electronically Signed   By: Darliss Cheney M.D.   On: 01/05/2023 19:47    Pending Labs Unresulted Labs (From admission, onward)     Start     Ordered   01/05/23 1700  Culture, blood (Routine x 2)  BLOOD CULTURE X 2,   R (with STAT occurrences)      01/05/23 1700   01/05/23 1700  Urinalysis,  w/ Reflex to Culture (Infection Suspected) -Urine, Clean Catch  Once,   URGENT       Question:  Specimen Source  Answer:  Urine, Clean Catch   01/05/23 1700  Vitals/Pain Today's Vitals   01/05/23 2049 01/05/23 2100 01/05/23 2130 01/05/23 2230  BP: (!) 146/84 (!) 150/88 117/75 129/81  Pulse: 89 88 85 81  Resp: 18 (!) 23 20 19   Temp: (!) 101.2 F (38.4 C)     TempSrc: Oral     SpO2: 96% 96% 96% 99%  PainSc:        Isolation Precautions Airborne and Contact precautions  Medications Medications  lactated ringers infusion ( Intravenous New Bag/Given 01/05/23 1927)  azithromycin (ZITHROMAX) 500 mg in sodium chloride 0.9 % 250 mL IVPB (0 mg Intravenous Stopped 01/05/23 2236)  cefTRIAXone (ROCEPHIN) 1 g in sodium chloride 0.9 % 100 mL IVPB (0 g Intravenous Stopped 01/05/23 2103)  HYDROmorphone (DILAUDID) injection 0.5 mg (0.5 mg Intravenous Given 01/05/23 1913)  metoCLOPramide (REGLAN) injection 10 mg (10 mg Intravenous Given 01/05/23 1912)  acetaminophen (TYLENOL) tablet 650 mg (650 mg Oral Given 01/05/23 1926)    Mobility walks     Focused Assessments    R Recommendations: See Admitting Provider Note  Report given to:   Additional Notes: Patient is alert, oriented, here for pneumonia.

## 2023-01-06 ENCOUNTER — Observation Stay (HOSPITAL_COMMUNITY): Payer: PRIVATE HEALTH INSURANCE

## 2023-01-06 DIAGNOSIS — R9431 Abnormal electrocardiogram [ECG] [EKG]: Secondary | ICD-10-CM

## 2023-01-06 DIAGNOSIS — Z833 Family history of diabetes mellitus: Secondary | ICD-10-CM | POA: Diagnosis not present

## 2023-01-06 DIAGNOSIS — E119 Type 2 diabetes mellitus without complications: Secondary | ICD-10-CM | POA: Diagnosis not present

## 2023-01-06 DIAGNOSIS — N186 End stage renal disease: Secondary | ICD-10-CM | POA: Diagnosis present

## 2023-01-06 DIAGNOSIS — Z882 Allergy status to sulfonamides status: Secondary | ICD-10-CM | POA: Diagnosis not present

## 2023-01-06 DIAGNOSIS — Z992 Dependence on renal dialysis: Secondary | ICD-10-CM | POA: Diagnosis not present

## 2023-01-06 DIAGNOSIS — K8020 Calculus of gallbladder without cholecystitis without obstruction: Secondary | ICD-10-CM | POA: Diagnosis present

## 2023-01-06 DIAGNOSIS — I5043 Acute on chronic combined systolic (congestive) and diastolic (congestive) heart failure: Secondary | ICD-10-CM | POA: Diagnosis present

## 2023-01-06 DIAGNOSIS — R7989 Other specified abnormal findings of blood chemistry: Secondary | ICD-10-CM | POA: Diagnosis not present

## 2023-01-06 DIAGNOSIS — R112 Nausea with vomiting, unspecified: Secondary | ICD-10-CM | POA: Diagnosis present

## 2023-01-06 DIAGNOSIS — J189 Pneumonia, unspecified organism: Secondary | ICD-10-CM | POA: Diagnosis present

## 2023-01-06 DIAGNOSIS — N2581 Secondary hyperparathyroidism of renal origin: Secondary | ICD-10-CM | POA: Diagnosis present

## 2023-01-06 DIAGNOSIS — I420 Dilated cardiomyopathy: Secondary | ICD-10-CM | POA: Diagnosis present

## 2023-01-06 DIAGNOSIS — R931 Abnormal findings on diagnostic imaging of heart and coronary circulation: Secondary | ICD-10-CM

## 2023-01-06 DIAGNOSIS — K3184 Gastroparesis: Secondary | ICD-10-CM

## 2023-01-06 DIAGNOSIS — E1143 Type 2 diabetes mellitus with diabetic autonomic (poly)neuropathy: Secondary | ICD-10-CM | POA: Diagnosis present

## 2023-01-06 DIAGNOSIS — D631 Anemia in chronic kidney disease: Secondary | ICD-10-CM | POA: Diagnosis present

## 2023-01-06 DIAGNOSIS — Z79899 Other long term (current) drug therapy: Secondary | ICD-10-CM | POA: Diagnosis not present

## 2023-01-06 DIAGNOSIS — D638 Anemia in other chronic diseases classified elsewhere: Secondary | ICD-10-CM

## 2023-01-06 DIAGNOSIS — I132 Hypertensive heart and chronic kidney disease with heart failure and with stage 5 chronic kidney disease, or end stage renal disease: Secondary | ICD-10-CM | POA: Diagnosis present

## 2023-01-06 DIAGNOSIS — I493 Ventricular premature depolarization: Secondary | ICD-10-CM | POA: Diagnosis present

## 2023-01-06 DIAGNOSIS — R946 Abnormal results of thyroid function studies: Secondary | ICD-10-CM | POA: Diagnosis present

## 2023-01-06 DIAGNOSIS — Z885 Allergy status to narcotic agent status: Secondary | ICD-10-CM | POA: Diagnosis not present

## 2023-01-06 DIAGNOSIS — E1122 Type 2 diabetes mellitus with diabetic chronic kidney disease: Secondary | ICD-10-CM | POA: Diagnosis present

## 2023-01-06 DIAGNOSIS — D72829 Elevated white blood cell count, unspecified: Secondary | ICD-10-CM | POA: Diagnosis present

## 2023-01-06 DIAGNOSIS — H543 Unqualified visual loss, both eyes: Secondary | ICD-10-CM | POA: Diagnosis present

## 2023-01-06 DIAGNOSIS — I700 Atherosclerosis of aorta: Secondary | ICD-10-CM | POA: Diagnosis present

## 2023-01-06 DIAGNOSIS — Y95 Nosocomial condition: Secondary | ICD-10-CM | POA: Diagnosis present

## 2023-01-06 DIAGNOSIS — I10 Essential (primary) hypertension: Secondary | ICD-10-CM

## 2023-01-06 DIAGNOSIS — I272 Pulmonary hypertension, unspecified: Secondary | ICD-10-CM | POA: Diagnosis present

## 2023-01-06 DIAGNOSIS — Z1152 Encounter for screening for COVID-19: Secondary | ICD-10-CM | POA: Diagnosis not present

## 2023-01-06 DIAGNOSIS — I428 Other cardiomyopathies: Secondary | ICD-10-CM | POA: Diagnosis not present

## 2023-01-06 LAB — HEPATITIS B SURFACE ANTIGEN: Hepatitis B Surface Ag: NONREACTIVE

## 2023-01-06 LAB — ECHOCARDIOGRAM COMPLETE
Area-P 1/2: 5.31 cm2
Height: 69 in
S' Lateral: 4.2 cm
Weight: 3315.72 [oz_av]

## 2023-01-06 LAB — IRON AND TIBC
Iron: 7 ug/dL — ABNORMAL LOW (ref 45–182)
Saturation Ratios: 6 % — ABNORMAL LOW (ref 17.9–39.5)
TIBC: 126 ug/dL — ABNORMAL LOW (ref 250–450)
UIBC: 119 ug/dL

## 2023-01-06 LAB — COMPREHENSIVE METABOLIC PANEL
ALT: 14 U/L (ref 0–44)
AST: 12 U/L — ABNORMAL LOW (ref 15–41)
Albumin: 3.2 g/dL — ABNORMAL LOW (ref 3.5–5.0)
Alkaline Phosphatase: 33 U/L — ABNORMAL LOW (ref 38–126)
Anion gap: 12 (ref 5–15)
BUN: 38 mg/dL — ABNORMAL HIGH (ref 6–20)
CO2: 24 mmol/L (ref 22–32)
Calcium: 8.5 mg/dL — ABNORMAL LOW (ref 8.9–10.3)
Chloride: 97 mmol/L — ABNORMAL LOW (ref 98–111)
Creatinine, Ser: 9.77 mg/dL — ABNORMAL HIGH (ref 0.61–1.24)
GFR, Estimated: 6 mL/min — ABNORMAL LOW (ref >60)
Glucose, Bld: 140 mg/dL — ABNORMAL HIGH (ref 70–99)
Potassium: 3.8 mmol/L (ref 3.5–5.1)
Sodium: 133 mmol/L — ABNORMAL LOW (ref 135–145)
Total Bilirubin: 0.4 mg/dL (ref 0.3–1.2)
Total Protein: 6.3 g/dL — ABNORMAL LOW (ref 6.5–8.1)

## 2023-01-06 LAB — RETICULOCYTES
Immature Retic Fract: 9.8 % (ref 2.3–15.9)
RBC.: 3.17 MIL/uL — ABNORMAL LOW (ref 4.22–5.81)
Retic Count, Absolute: 18.7 10*3/uL — ABNORMAL LOW (ref 19.0–186.0)
Retic Ct Pct: 0.6 % (ref 0.4–3.1)

## 2023-01-06 LAB — URINALYSIS, W/ REFLEX TO CULTURE (INFECTION SUSPECTED)
Bilirubin Urine: NEGATIVE
Glucose, UA: 250 mg/dL — AB
Ketones, ur: NEGATIVE mg/dL
Leukocytes,Ua: NEGATIVE
Nitrite: NEGATIVE
Protein, ur: >300 mg/dL — AB
Specific Gravity, Urine: 1.02 (ref 1.005–1.030)
pH: 6 (ref 5.0–8.0)

## 2023-01-06 LAB — CBC
HCT: 29.7 % — ABNORMAL LOW (ref 39.0–52.0)
Hemoglobin: 9.4 g/dL — ABNORMAL LOW (ref 13.0–17.0)
MCH: 29.3 pg (ref 26.0–34.0)
MCHC: 31.6 g/dL (ref 30.0–36.0)
MCV: 92.5 fL (ref 80.0–100.0)
Platelets: 106 10*3/uL — ABNORMAL LOW (ref 150–400)
RBC: 3.21 MIL/uL — ABNORMAL LOW (ref 4.22–5.81)
RDW: 14.6 % (ref 11.5–15.5)
WBC: 5.2 10*3/uL (ref 4.0–10.5)
nRBC: 0 % (ref 0.0–0.2)

## 2023-01-06 LAB — VITAMIN B12: Vitamin B-12: 254 pg/mL (ref 180–914)

## 2023-01-06 LAB — BLOOD GAS, VENOUS
Acid-Base Excess: 1.3 mmol/L (ref 0.0–2.0)
Bicarbonate: 25.9 mmol/L (ref 20.0–28.0)
O2 Saturation: 78.7 %
Patient temperature: 37.1
pCO2, Ven: 40 mmHg — ABNORMAL LOW (ref 44–60)
pH, Ven: 7.42 (ref 7.25–7.43)
pO2, Ven: 46 mmHg — ABNORMAL HIGH (ref 32–45)

## 2023-01-06 LAB — GLUCOSE, CAPILLARY
Glucose-Capillary: 125 mg/dL — ABNORMAL HIGH (ref 70–99)
Glucose-Capillary: 136 mg/dL — ABNORMAL HIGH (ref 70–99)
Glucose-Capillary: 144 mg/dL — ABNORMAL HIGH (ref 70–99)
Glucose-Capillary: 130 mg/dL — ABNORMAL HIGH (ref 70–99)
Glucose-Capillary: 148 mg/dL — ABNORMAL HIGH (ref 70–99)
Glucose-Capillary: 162 mg/dL — ABNORMAL HIGH (ref 70–99)

## 2023-01-06 LAB — EXPECTORATED SPUTUM ASSESSMENT W GRAM STAIN, RFLX TO RESP C

## 2023-01-06 LAB — TSH: TSH: 16.771 u[IU]/mL — ABNORMAL HIGH (ref 0.350–4.500)

## 2023-01-06 LAB — TROPONIN I (HIGH SENSITIVITY)
Troponin I (High Sensitivity): 49 ng/L — ABNORMAL HIGH (ref <18)
Troponin I (High Sensitivity): 50 ng/L — ABNORMAL HIGH (ref <18)

## 2023-01-06 LAB — HIV ANTIBODY (ROUTINE TESTING W REFLEX): HIV Screen 4th Generation wRfx: NONREACTIVE

## 2023-01-06 LAB — PROCALCITONIN: Procalcitonin: 7.71 ng/mL

## 2023-01-06 LAB — FERRITIN: Ferritin: 544 ng/mL — ABNORMAL HIGH (ref 24–336)

## 2023-01-06 LAB — MAGNESIUM: Magnesium: 2.1 mg/dL (ref 1.7–2.4)

## 2023-01-06 LAB — STREP PNEUMONIAE URINARY ANTIGEN: Strep Pneumo Urinary Antigen: NEGATIVE

## 2023-01-06 LAB — PHOSPHORUS: Phosphorus: 5.3 mg/dL — ABNORMAL HIGH (ref 2.5–4.6)

## 2023-01-06 LAB — FOLATE: Folate: 8 ng/mL (ref >5.9)

## 2023-01-06 MED ORDER — SODIUM CHLORIDE 0.9 % IV SOLN
12.5000 mg | Freq: Once | INTRAVENOUS | Status: AC
  Administered 2023-01-06: 12.5 mg via INTRAVENOUS
  Filled 2023-01-06: qty 0.5
  Filled 2023-01-06: qty 12.5

## 2023-01-06 MED ORDER — ONDANSETRON HCL 4 MG/2ML IJ SOLN
4.0000 mg | Freq: Four times a day (QID) | INTRAMUSCULAR | Status: DC | PRN
  Administered 2023-01-06 (×2): 4 mg via INTRAVENOUS
  Filled 2023-01-06: qty 2

## 2023-01-06 MED ORDER — PROCHLORPERAZINE EDISYLATE 10 MG/2ML IJ SOLN
5.0000 mg | Freq: Once | INTRAMUSCULAR | Status: AC
  Administered 2023-01-06: 5 mg via INTRAVENOUS
  Filled 2023-01-06: qty 2

## 2023-01-06 MED ORDER — ATORVASTATIN CALCIUM 40 MG PO TABS
40.0000 mg | ORAL_TABLET | Freq: Every day | ORAL | Status: DC
  Administered 2023-01-06: 40 mg via ORAL
  Filled 2023-01-06 (×2): qty 1

## 2023-01-06 MED ORDER — HYDROCODONE-ACETAMINOPHEN 5-325 MG PO TABS
1.0000 | ORAL_TABLET | ORAL | Status: DC | PRN
  Administered 2023-01-06 (×2): 1 via ORAL
  Filled 2023-01-06 (×2): qty 1

## 2023-01-06 MED ORDER — ONDANSETRON HCL 4 MG PO TABS: 4.0000 mg | ORAL_TABLET | Freq: Four times a day (QID) | ORAL | Status: DC | PRN

## 2023-01-06 MED ORDER — ONDANSETRON HCL 4 MG/2ML IJ SOLN
4.0000 mg | Freq: Four times a day (QID) | INTRAMUSCULAR | Status: DC | PRN
  Filled 2023-01-06: qty 2

## 2023-01-06 MED ORDER — PROSOURCE PLUS PO LIQD
30.0000 mL | Freq: Two times a day (BID) | ORAL | Status: DC
  Administered 2023-01-06 – 2023-01-09 (×4): 30 mL via ORAL
  Filled 2023-01-06 (×7): qty 30

## 2023-01-06 MED ORDER — SODIUM CHLORIDE 0.9% FLUSH
3.0000 mL | Freq: Two times a day (BID) | INTRAVENOUS | Status: DC
  Administered 2023-01-06 – 2023-01-12 (×11): 3 mL via INTRAVENOUS

## 2023-01-06 MED ORDER — SODIUM CHLORIDE 0.9 % IV SOLN: 250.0000 mL | INTRAVENOUS | Status: DC | PRN

## 2023-01-06 MED ORDER — LACTATED RINGERS IV SOLN: INTRAVENOUS | Status: DC

## 2023-01-06 MED ORDER — ACETAMINOPHEN 650 MG RE SUPP: 650.0000 mg | Freq: Four times a day (QID) | RECTAL | Status: DC | PRN

## 2023-01-06 MED ORDER — CHLORHEXIDINE GLUCONATE CLOTH 2 % EX PADS
6.0000 | MEDICATED_PAD | Freq: Every day | CUTANEOUS | Status: DC
  Administered 2023-01-07 – 2023-01-10 (×4): 6 via TOPICAL

## 2023-01-06 MED ORDER — GUAIFENESIN ER 600 MG PO TB12
600.0000 mg | ORAL_TABLET | Freq: Two times a day (BID) | ORAL | Status: DC
  Administered 2023-01-06: 600 mg via ORAL
  Filled 2023-01-06: qty 1

## 2023-01-06 MED ORDER — ALBUTEROL SULFATE (2.5 MG/3ML) 0.083% IN NEBU
2.5000 mg | INHALATION_SOLUTION | RESPIRATORY_TRACT | Status: DC | PRN
  Administered 2023-01-08: 2.5 mg via RESPIRATORY_TRACT
  Filled 2023-01-06: qty 3

## 2023-01-06 MED ORDER — ENSURE ENLIVE PO LIQD
237.0000 mL | Freq: Three times a day (TID) | ORAL | Status: DC
  Administered 2023-01-06 – 2023-01-11 (×6): 237 mL via ORAL

## 2023-01-06 MED ORDER — ACETAMINOPHEN 325 MG PO TABS
650.0000 mg | ORAL_TABLET | Freq: Four times a day (QID) | ORAL | Status: DC | PRN
  Administered 2023-01-07 – 2023-01-10 (×2): 650 mg via ORAL
  Filled 2023-01-06 (×2): qty 2

## 2023-01-06 MED ORDER — SACUBITRIL-VALSARTAN 24-26 MG PO TABS
1.0000 | ORAL_TABLET | Freq: Two times a day (BID) | ORAL | Status: DC
  Administered 2023-01-06 – 2023-01-12 (×11): 1 via ORAL
  Filled 2023-01-06 (×13): qty 1

## 2023-01-06 MED ORDER — CARVEDILOL 25 MG PO TABS
25.0000 mg | ORAL_TABLET | Freq: Two times a day (BID) | ORAL | Status: DC
  Administered 2023-01-06 – 2023-01-07 (×3): 25 mg via ORAL
  Filled 2023-01-06 (×3): qty 1

## 2023-01-06 MED ORDER — METOCLOPRAMIDE HCL 5 MG PO TABS: 5.0000 mg | ORAL_TABLET | Freq: Four times a day (QID) | ORAL | Status: DC

## 2023-01-06 MED ORDER — AZITHROMYCIN 500 MG PO TABS
500.0000 mg | ORAL_TABLET | Freq: Every day | ORAL | Status: DC
  Administered 2023-01-06 – 2023-01-08 (×3): 500 mg via ORAL
  Filled 2023-01-06 (×3): qty 1

## 2023-01-06 MED ORDER — SODIUM CHLORIDE 0.9% FLUSH: 3.0000 mL | INTRAVENOUS | Status: DC | PRN

## 2023-01-06 MED ORDER — CALCITRIOL 0.5 MCG PO CAPS
0.5000 ug | ORAL_CAPSULE | Freq: Every day | ORAL | Status: DC
  Administered 2023-01-06 – 2023-01-12 (×6): 0.5 ug via ORAL
  Filled 2023-01-06 (×7): qty 1

## 2023-01-06 MED ORDER — PANTOPRAZOLE SODIUM 40 MG PO TBEC
40.0000 mg | DELAYED_RELEASE_TABLET | Freq: Two times a day (BID) | ORAL | Status: DC
  Administered 2023-01-06 – 2023-01-07 (×3): 40 mg via ORAL
  Filled 2023-01-06 (×4): qty 1

## 2023-01-06 MED ORDER — DARBEPOETIN ALFA 60 MCG/0.3ML IJ SOSY
60.0000 ug | PREFILLED_SYRINGE | INTRAMUSCULAR | Status: DC
  Administered 2023-01-06: 60 ug via SUBCUTANEOUS
  Filled 2023-01-06: qty 0.3

## 2023-01-06 MED ORDER — GUAIFENESIN ER 600 MG PO TB12
1200.0000 mg | ORAL_TABLET | Freq: Two times a day (BID) | ORAL | Status: DC
  Administered 2023-01-06 – 2023-01-12 (×11): 1200 mg via ORAL
  Filled 2023-01-06 (×11): qty 2

## 2023-01-06 MED ORDER — AMLODIPINE BESYLATE 10 MG PO TABS
10.0000 mg | ORAL_TABLET | Freq: Every day | ORAL | Status: DC
  Administered 2023-01-06: 10 mg via ORAL
  Filled 2023-01-06: qty 1

## 2023-01-06 MED ORDER — INSULIN ASPART 100 UNIT/ML IJ SOLN
0.0000 [IU] | INTRAMUSCULAR | Status: DC
  Administered 2023-01-06 (×2): 1 [IU] via SUBCUTANEOUS
  Administered 2023-01-06: 2 [IU] via SUBCUTANEOUS
  Administered 2023-01-07 – 2023-01-08 (×2): 1 [IU] via SUBCUTANEOUS
  Administered 2023-01-08: 2 [IU] via SUBCUTANEOUS
  Administered 2023-01-09: 1 [IU] via SUBCUTANEOUS

## 2023-01-06 MED ORDER — METOCLOPRAMIDE HCL 5 MG/ML IJ SOLN
5.0000 mg | Freq: Four times a day (QID) | INTRAMUSCULAR | Status: DC
  Administered 2023-01-06 – 2023-01-07 (×5): 5 mg via INTRAVENOUS
  Filled 2023-01-06 (×5): qty 2

## 2023-01-06 MED ORDER — SODIUM CHLORIDE 0.9 % IV SOLN
2.0000 g | INTRAVENOUS | Status: DC
  Administered 2023-01-06 – 2023-01-10 (×5): 2 g via INTRAVENOUS
  Filled 2023-01-06 (×6): qty 20

## 2023-01-06 NOTE — Progress Notes (Signed)
Heart Failure Navigator Progress Note  Assessed for Heart & Vascular TOC clinic readiness.  Patient does not meet criteria due to ESRD and Dialysis.   Navigator will sign off at this time.   Tykira Wachs,RN, BSN,MSN Heart Failure Nurse Navigator. Contact by secure chat only.

## 2023-01-06 NOTE — Assessment & Plan Note (Signed)
- -  ER notified nephrology by msg that patient has been admitted    - usually gets dialysis on   Tuesday Thursday Saturday    -currently does not appear to be fluid overloaded   - no evidence of hyperkalemia

## 2023-01-06 NOTE — Evaluation (Signed)
Clinical/Bedside Swallow Evaluation Patient Details  Name: [name] MRN: [id number] Date of Birth: [birth year]  Today's Date: 01/06/2023 Time: SLP Start Time (ACUTE ONLY): 1003 SLP Stop Time (ACUTE ONLY): 1020 SLP Time Calculation (min) (ACUTE ONLY): 17 min  Past Medical History:  Past Medical History:  Diagnosis Date   ESRD (end stage renal disease) (HCC)    Hypertension    Past Surgical History:  Past Surgical History:  Procedure Laterality Date   BIOPSY  12/22/2022   Procedure: BIOPSY;  Surgeon: Imogene Burn, MD;  Location: [facility];  Service: Gastroenterology;;   COLONOSCOPY WITH PROPOFOL N/A 12/22/2022   Procedure: COLONOSCOPY WITH PROPOFOL;  Surgeon: Imogene Burn, MD;  Location: [facility];  Service: Gastroenterology;  Laterality: N/A;   ESOPHAGOGASTRODUODENOSCOPY (EGD) WITH PROPOFOL N/A 12/22/2022   Procedure: ESOPHAGOGASTRODUODENOSCOPY (EGD) WITH PROPOFOL;  Surgeon: Imogene Burn, MD;  Location: [facility];  Service: Gastroenterology;  Laterality: N/A;   EYE SURGERY     POLYPECTOMY  12/22/2022   Procedure: POLYPECTOMY;  Surgeon: Imogene Burn, MD;  Location: [facility];  Service: Gastroenterology;;   HPI:  [name] is a 47 yo male presenting to ED 9/12 with fever and n/v x2 days. CT Abdomen/Pelvis with moderate to marked severity R middle lobe infiltrate and small R pleural effusion. PMH includes end-stage renal disease, gastroparesis, HTN, T2DM, ESRD    Assessment / Plan / Recommendation  Clinical Impression  Pt reports no prior difficulty with oropharyngeal swallow function, although endorses difficulty due to reflux and gastroparesis. He states that he eats a diet made up of primarily fruits, vegetables, and white meat. Per MD, pt cleared for solids from a GI standpoint for swallowing evaluation. Oral motor exam WFL. Observed pt with trials of thin liquids, purees, and solids with no signs clincally indicative of aspiration. With regular texture  solids, pt endorsed a globus sensation that required multiple swallows and a liquid wash to clear. Feel this is indicative of a suspected esophageal component. Overall, suspect that pt's risk for aspiration is likely post-prandial. Provided education regarding esophageal precautions, with which pt verbalizes understanding. Recommend upgrading diet to regular textures with thin liquids. SLP to s/o at this time. SLP Visit Diagnosis: Dysphagia, unspecified (R13.10)    Aspiration Risk  Moderate aspiration risk    Diet Recommendation Regular;Thin liquid    Liquid Administration via: Cup;Straw Medication Administration: Whole meds with puree Supervision: Patient able to self feed Compensations: Slow rate;Small sips/bites;Follow solids with liquid Postural Changes: Seated upright at 90 degrees;Remain upright for at least 30 minutes after po intake    Other  Recommendations Oral Care Recommendations: Oral care BID    Recommendations for follow up therapy are one component of a multi-disciplinary discharge planning process, led by the attending physician.  Recommendations may be updated based on patient status, additional functional criteria and insurance authorization.  Follow up Recommendations No SLP follow up      Assistance Recommended at Discharge    Functional Status Assessment Patient has not had a recent decline in their functional status  Frequency and Duration            Prognosis Prognosis for improved oropharyngeal function: Good Barriers to Reach Goals: Time post onset      Swallow Study   General HPI: [name] is a 47 yo male presenting to ED 9/12 with fever and n/v x2 days. CT Abdomen/Pelvis with moderate to marked severity R middle lobe infiltrate and small R pleural effusion. PMH  includes end-stage renal disease, gastroparesis, HTN, T2DM, ESRD Type of Study: Bedside Swallow Evaluation Previous Swallow Assessment: none in chart Diet Prior to this Study: Clear  liquid diet Temperature Spikes Noted: No Respiratory Status: Room air History of Recent Intubation: No Behavior/Cognition: Alert;Cooperative;Pleasant mood Oral Cavity Assessment: Within Functional Limits Oral Care Completed by SLP: No Oral Cavity - Dentition: Adequate natural dentition Vision: Functional for self-feeding Self-Feeding Abilities: Able to feed self Patient Positioning: Upright in bed Baseline Vocal Quality: Normal Volitional Cough: Strong Volitional Swallow: Able to elicit    Oral/Motor/Sensory Function Overall Oral Motor/Sensory Function: Within functional limits   Ice Chips Ice chips: Not tested   Thin Liquid Thin Liquid: Within functional limits Presentation: Straw;Self Fed    Nectar Thick Nectar Thick Liquid: Not tested   Honey Thick Honey Thick Liquid: Not tested   Puree Puree: Within functional limits Presentation: Spoon;Self Fed   Solid     Solid: Within functional limits Presentation: Self Fed      Gwynneth Aliment, M.A., CF-SLP Speech Language Pathology, Acute Rehabilitation Services  Secure Chat preferred [phone]  01/06/2023,10:45 AM

## 2023-01-06 NOTE — Consult Note (Addendum)
CARDIOLOGY CONSULT NOTE       Patient ID: [name] MRN: [id number] DOB/AGE: Jun 08, 1975 47 y.o.  Admit date: 01/05/2023 Referring Physician: Adela Erickson Primary Physician: Tim Link, MD Primary Cardiologist: [name] Reason for Consultation: Cardiomyopathy  Principal Problem:   CAP (community acquired pneumonia) Active Problems:   Nausea & vomiting   Elevated troponin   ESRD (end stage renal disease) (HCC)   HPI:  47 y.o. admitted with pneumonia. Asked to see as TTE showed EF 40-45% global hypokinesis grade 2 diastolic dysfunction no significant valve dx. Patient fairly ill on admission with high grade fever and right middle lobe pneumonia. He has no prior cardiac history From [name] moved here a year ago when he got married but doctors mostly in [place]. 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  He indicates no issues with hypotension at home. K does not run high. He said he doesn't know why he got renal failure. Mother/Aunt also had it. But he is diabetic with poor peripheral vision and has HTN.  No chest pain. Volume controlled by dialysis. He is on disability and does not work. Currently being Rx with Rocephin for community aquired pneumonia. Procalcitonin elevated 7.7 Lactic acid normal WBC 5.6  ROS All other systems reviewed and negative except as noted above  Past Medical History:  Diagnosis Date   ESRD (end stage renal disease) (HCC)    Hypertension     Family History  Problem Relation Age of Onset   Diabetes Mother    Lung disease Father    Liver disease Neg Hx    Esophageal cancer Neg Hx    Colon cancer Neg Hx     Social History   Socioeconomic History   Marital status: Married    Spouse name: Not on file   Number of children: 4   Years of education: Not on file   Highest education level: Not on file  Occupational History   Occupation: disable  Tobacco Use   Smoking status: Never   Smokeless tobacco: Never  Vaping Use    Vaping status: Not on file  Substance and Sexual Activity   Alcohol use: Not on file   Drug use: Not on file   Sexual activity: Not on file  Other Topics Concern   Not on file  Social History Narrative   Not on file   Social Determinants of Health   Financial Resource Strain: Low Risk  (10/30/2019)   Received from [facility], [facility]   Overall Financial Resource Strain (CARDIA)    Difficulty of Paying Living Expenses: Not hard at all  Food Insecurity: No Food Insecurity (01/06/2023)   Hunger Vital Sign    Worried About Running Out of Food in the Last Year: Never true    Ran Out of Food in the Last Year: Never true  Transportation Needs: No Transportation Needs (01/06/2023)   PRAPARE - Administrator, Civil Service (Medical): No    Lack of Transportation (Non-Medical): No  Physical Activity: Insufficiently Active (10/30/2019)   Received from [facility], [facility]   Exercise Vital Sign    Days of Exercise per Week: 3 days    Minutes of Exercise per Session: 10 min  Stress: No Stress Concern Present (11/16/2018)   Received from [facility], [facility]   [employer] of Occupational Health - Occupational Stress Questionnaire  01/06/2023 Medical Rec #:  [id number]        Height:       69.0 in Accession #:    [id number]       Weight:       207.2 lb Date of Birth:  [birth year]        BSA:          2.097 m Patient Age:    47 years         BP:           151/84 mmHg Patient Gender: M                 HR:           89 bpm. Exam Location:  Inpatient Procedure: 2D Echo, Cardiac Doppler and Color Doppler Indications:    Abnormal ECG R94.31  History:        Patient has no prior history of Echocardiogram examinations.                 Risk Factors:Hypertension.  Sonographer:    Harriette Bouillon RDCS Referring Phys: [id number] ANASTASSIA DOUTOVA IMPRESSIONS  1. Left ventricular ejection fraction, by estimation, is 40 to 45%. The left ventricle has mildly decreased function. The left ventricle demonstrates global hypokinesis. There is moderate left ventricular hypertrophy. Left ventricular diastolic parameters are consistent with Grade II diastolic dysfunction (pseudonormalization).  2. Right ventricular systolic function is normal. The right ventricular size is mildly enlarged. Tricuspid regurgitation signal is inadequate for assessing PA pressure.  3. Left atrial size was mildly dilated.  4. The mitral valve is normal in structure. Trivial mitral valve regurgitation. No evidence of mitral stenosis.  5. The aortic valve is tricuspid. Aortic valve regurgitation is not visualized. No aortic stenosis is present.  6. Mildly dilated pulmonary artery.  7. The inferior vena cava is dilated in size with <50% respiratory variability, suggesting right atrial pressure of 15 mmHg. FINDINGS  Left Ventricle: Left ventricular ejection fraction, by estimation, is 40 to 45%. The left ventricle has mildly decreased function. The left ventricle demonstrates global hypokinesis. The left ventricular internal cavity size was normal in size. There is  moderate left ventricular hypertrophy. Left ventricular diastolic parameters are consistent with Grade II diastolic dysfunction (pseudonormalization). Right Ventricle: The right ventricular size is mildly enlarged. No increase in right ventricular wall thickness. Right ventricular systolic function is normal. Tricuspid regurgitation signal is inadequate for assessing PA pressure. Left  Atrium: Left atrial size was mildly dilated. Right Atrium: Right atrial size was normal in size. Pericardium: Trivial pericardial effusion is present. Mitral Valve: The mitral valve is normal in structure. Trivial mitral valve regurgitation. No evidence of mitral valve stenosis. Tricuspid Valve: The tricuspid valve is normal in structure. Tricuspid valve regurgitation is trivial. No evidence of tricuspid stenosis. Aortic Valve: The aortic valve is tricuspid. Aortic valve regurgitation is not visualized. No aortic stenosis is present. Pulmonic Valve: The pulmonic valve was normal in structure. Pulmonic valve regurgitation is trivial. No evidence of pulmonic stenosis. Aorta: The aortic root is normal in size and structure. Pulmonary Artery: The pulmonary artery is mildly dilated. Venous: The inferior vena cava is dilated in size with less than 50% respiratory variability, suggesting right atrial pressure of 15 mmHg. IAS/Shunts: No atrial level shunt detected by color flow Doppler.  LEFT VENTRICLE PLAX 2D LVIDd:         5.30 cm   Diastology LVIDs:         4.20 cm  01/06/2023 Medical Rec #:  [id number]        Height:       69.0 in Accession #:    [id number]       Weight:       207.2 lb Date of Birth:  [birth year]        BSA:          2.097 m Patient Age:    47 years         BP:           151/84 mmHg Patient Gender: M                 HR:           89 bpm. Exam Location:  Inpatient Procedure: 2D Echo, Cardiac Doppler and Color Doppler Indications:    Abnormal ECG R94.31  History:        Patient has no prior history of Echocardiogram examinations.                 Risk Factors:Hypertension.  Sonographer:    Harriette Bouillon RDCS Referring Phys: [id number] ANASTASSIA DOUTOVA IMPRESSIONS  1. Left ventricular ejection fraction, by estimation, is 40 to 45%. The left ventricle has mildly decreased function. The left ventricle demonstrates global hypokinesis. There is moderate left ventricular hypertrophy. Left ventricular diastolic parameters are consistent with Grade II diastolic dysfunction (pseudonormalization).  2. Right ventricular systolic function is normal. The right ventricular size is mildly enlarged. Tricuspid regurgitation signal is inadequate for assessing PA pressure.  3. Left atrial size was mildly dilated.  4. The mitral valve is normal in structure. Trivial mitral valve regurgitation. No evidence of mitral stenosis.  5. The aortic valve is tricuspid. Aortic valve regurgitation is not visualized. No aortic stenosis is present.  6. Mildly dilated pulmonary artery.  7. The inferior vena cava is dilated in size with <50% respiratory variability, suggesting right atrial pressure of 15 mmHg. FINDINGS  Left Ventricle: Left ventricular ejection fraction, by estimation, is 40 to 45%. The left ventricle has mildly decreased function. The left ventricle demonstrates global hypokinesis. The left ventricular internal cavity size was normal in size. There is  moderate left ventricular hypertrophy. Left ventricular diastolic parameters are consistent with Grade II diastolic dysfunction (pseudonormalization). Right Ventricle: The right ventricular size is mildly enlarged. No increase in right ventricular wall thickness. Right ventricular systolic function is normal. Tricuspid regurgitation signal is inadequate for assessing PA pressure. Left  Atrium: Left atrial size was mildly dilated. Right Atrium: Right atrial size was normal in size. Pericardium: Trivial pericardial effusion is present. Mitral Valve: The mitral valve is normal in structure. Trivial mitral valve regurgitation. No evidence of mitral valve stenosis. Tricuspid Valve: The tricuspid valve is normal in structure. Tricuspid valve regurgitation is trivial. No evidence of tricuspid stenosis. Aortic Valve: The aortic valve is tricuspid. Aortic valve regurgitation is not visualized. No aortic stenosis is present. Pulmonic Valve: The pulmonic valve was normal in structure. Pulmonic valve regurgitation is trivial. No evidence of pulmonic stenosis. Aorta: The aortic root is normal in size and structure. Pulmonary Artery: The pulmonary artery is mildly dilated. Venous: The inferior vena cava is dilated in size with less than 50% respiratory variability, suggesting right atrial pressure of 15 mmHg. IAS/Shunts: No atrial level shunt detected by color flow Doppler.  LEFT VENTRICLE PLAX 2D LVIDd:         5.30 cm   Diastology LVIDs:         4.20 cm  CARDIOLOGY CONSULT NOTE       Patient ID: [name] MRN: [id number] DOB/AGE: Jun 08, 1975 47 y.o.  Admit date: 01/05/2023 Referring Physician: Adela Erickson Primary Physician: Tim Link, MD Primary Cardiologist: [name] Reason for Consultation: Cardiomyopathy  Principal Problem:   CAP (community acquired pneumonia) Active Problems:   Nausea & vomiting   Elevated troponin   ESRD (end stage renal disease) (HCC)   HPI:  47 y.o. admitted with pneumonia. Asked to see as TTE showed EF 40-45% global hypokinesis grade 2 diastolic dysfunction no significant valve dx. Patient fairly ill on admission with high grade fever and right middle lobe pneumonia. He has no prior cardiac history From [name] moved here a year ago when he got married but doctors mostly in [place]. 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  He indicates no issues with hypotension at home. K does not run high. He said he doesn't know why he got renal failure. Mother/Aunt also had it. But he is diabetic with poor peripheral vision and has HTN.  No chest pain. Volume controlled by dialysis. He is on disability and does not work. Currently being Rx with Rocephin for community aquired pneumonia. Procalcitonin elevated 7.7 Lactic acid normal WBC 5.6  ROS All other systems reviewed and negative except as noted above  Past Medical History:  Diagnosis Date   ESRD (end stage renal disease) (HCC)    Hypertension     Family History  Problem Relation Age of Onset   Diabetes Mother    Lung disease Father    Liver disease Neg Hx    Esophageal cancer Neg Hx    Colon cancer Neg Hx     Social History   Socioeconomic History   Marital status: Married    Spouse name: Not on file   Number of children: 4   Years of education: Not on file   Highest education level: Not on file  Occupational History   Occupation: disable  Tobacco Use   Smoking status: Never   Smokeless tobacco: Never  Vaping Use    Vaping status: Not on file  Substance and Sexual Activity   Alcohol use: Not on file   Drug use: Not on file   Sexual activity: Not on file  Other Topics Concern   Not on file  Social History Narrative   Not on file   Social Determinants of Health   Financial Resource Strain: Low Risk  (10/30/2019)   Received from [facility], [facility]   Overall Financial Resource Strain (CARDIA)    Difficulty of Paying Living Expenses: Not hard at all  Food Insecurity: No Food Insecurity (01/06/2023)   Hunger Vital Sign    Worried About Running Out of Food in the Last Year: Never true    Ran Out of Food in the Last Year: Never true  Transportation Needs: No Transportation Needs (01/06/2023)   PRAPARE - Administrator, Civil Service (Medical): No    Lack of Transportation (Non-Medical): No  Physical Activity: Insufficiently Active (10/30/2019)   Received from [facility], [facility]   Exercise Vital Sign    Days of Exercise per Week: 3 days    Minutes of Exercise per Session: 10 min  Stress: No Stress Concern Present (11/16/2018)   Received from [facility], [facility]   [employer] of Occupational Health - Occupational Stress Questionnaire  CARDIOLOGY CONSULT NOTE       Patient ID: [name] MRN: [id number] DOB/AGE: Jun 08, 1975 47 y.o.  Admit date: 01/05/2023 Referring Physician: Adela Erickson Primary Physician: Tim Link, MD Primary Cardiologist: [name] Reason for Consultation: Cardiomyopathy  Principal Problem:   CAP (community acquired pneumonia) Active Problems:   Nausea & vomiting   Elevated troponin   ESRD (end stage renal disease) (HCC)   HPI:  47 y.o. admitted with pneumonia. Asked to see as TTE showed EF 40-45% global hypokinesis grade 2 diastolic dysfunction no significant valve dx. Patient fairly ill on admission with high grade fever and right middle lobe pneumonia. He has no prior cardiac history From [name] moved here a year ago when he got married but doctors mostly in [place]. 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  He indicates no issues with hypotension at home. K does not run high. He said he doesn't know why he got renal failure. Mother/Aunt also had it. But he is diabetic with poor peripheral vision and has HTN.  No chest pain. Volume controlled by dialysis. He is on disability and does not work. Currently being Rx with Rocephin for community aquired pneumonia. Procalcitonin elevated 7.7 Lactic acid normal WBC 5.6  ROS All other systems reviewed and negative except as noted above  Past Medical History:  Diagnosis Date   ESRD (end stage renal disease) (HCC)    Hypertension     Family History  Problem Relation Age of Onset   Diabetes Mother    Lung disease Father    Liver disease Neg Hx    Esophageal cancer Neg Hx    Colon cancer Neg Hx     Social History   Socioeconomic History   Marital status: Married    Spouse name: Not on file   Number of children: 4   Years of education: Not on file   Highest education level: Not on file  Occupational History   Occupation: disable  Tobacco Use   Smoking status: Never   Smokeless tobacco: Never  Vaping Use    Vaping status: Not on file  Substance and Sexual Activity   Alcohol use: Not on file   Drug use: Not on file   Sexual activity: Not on file  Other Topics Concern   Not on file  Social History Narrative   Not on file   Social Determinants of Health   Financial Resource Strain: Low Risk  (10/30/2019)   Received from [facility], [facility]   Overall Financial Resource Strain (CARDIA)    Difficulty of Paying Living Expenses: Not hard at all  Food Insecurity: No Food Insecurity (01/06/2023)   Hunger Vital Sign    Worried About Running Out of Food in the Last Year: Never true    Ran Out of Food in the Last Year: Never true  Transportation Needs: No Transportation Needs (01/06/2023)   PRAPARE - Administrator, Civil Service (Medical): No    Lack of Transportation (Non-Medical): No  Physical Activity: Insufficiently Active (10/30/2019)   Received from [facility], [facility]   Exercise Vital Sign    Days of Exercise per Week: 3 days    Minutes of Exercise per Session: 10 min  Stress: No Stress Concern Present (11/16/2018)   Received from [facility], [facility]   [employer] of Occupational Health - Occupational Stress Questionnaire  01/06/2023 Medical Rec #:  [id number]        Height:       69.0 in Accession #:    [id number]       Weight:       207.2 lb Date of Birth:  [birth year]        BSA:          2.097 m Patient Age:    47 years         BP:           151/84 mmHg Patient Gender: M                 HR:           89 bpm. Exam Location:  Inpatient Procedure: 2D Echo, Cardiac Doppler and Color Doppler Indications:    Abnormal ECG R94.31  History:        Patient has no prior history of Echocardiogram examinations.                 Risk Factors:Hypertension.  Sonographer:    Harriette Bouillon RDCS Referring Phys: [id number] ANASTASSIA DOUTOVA IMPRESSIONS  1. Left ventricular ejection fraction, by estimation, is 40 to 45%. The left ventricle has mildly decreased function. The left ventricle demonstrates global hypokinesis. There is moderate left ventricular hypertrophy. Left ventricular diastolic parameters are consistent with Grade II diastolic dysfunction (pseudonormalization).  2. Right ventricular systolic function is normal. The right ventricular size is mildly enlarged. Tricuspid regurgitation signal is inadequate for assessing PA pressure.  3. Left atrial size was mildly dilated.  4. The mitral valve is normal in structure. Trivial mitral valve regurgitation. No evidence of mitral stenosis.  5. The aortic valve is tricuspid. Aortic valve regurgitation is not visualized. No aortic stenosis is present.  6. Mildly dilated pulmonary artery.  7. The inferior vena cava is dilated in size with <50% respiratory variability, suggesting right atrial pressure of 15 mmHg. FINDINGS  Left Ventricle: Left ventricular ejection fraction, by estimation, is 40 to 45%. The left ventricle has mildly decreased function. The left ventricle demonstrates global hypokinesis. The left ventricular internal cavity size was normal in size. There is  moderate left ventricular hypertrophy. Left ventricular diastolic parameters are consistent with Grade II diastolic dysfunction (pseudonormalization). Right Ventricle: The right ventricular size is mildly enlarged. No increase in right ventricular wall thickness. Right ventricular systolic function is normal. Tricuspid regurgitation signal is inadequate for assessing PA pressure. Left  Atrium: Left atrial size was mildly dilated. Right Atrium: Right atrial size was normal in size. Pericardium: Trivial pericardial effusion is present. Mitral Valve: The mitral valve is normal in structure. Trivial mitral valve regurgitation. No evidence of mitral valve stenosis. Tricuspid Valve: The tricuspid valve is normal in structure. Tricuspid valve regurgitation is trivial. No evidence of tricuspid stenosis. Aortic Valve: The aortic valve is tricuspid. Aortic valve regurgitation is not visualized. No aortic stenosis is present. Pulmonic Valve: The pulmonic valve was normal in structure. Pulmonic valve regurgitation is trivial. No evidence of pulmonic stenosis. Aorta: The aortic root is normal in size and structure. Pulmonary Artery: The pulmonary artery is mildly dilated. Venous: The inferior vena cava is dilated in size with less than 50% respiratory variability, suggesting right atrial pressure of 15 mmHg. IAS/Shunts: No atrial level shunt detected by color flow Doppler.  LEFT VENTRICLE PLAX 2D LVIDd:         5.30 cm   Diastology LVIDs:         4.20 cm

## 2023-01-06 NOTE — Consult Note (Signed)
[facility] Renal Consultation Note    Indication for Consultation:  Management of ESRD/hemodialysis, anemia, hypertension/volume, and secondary hyperparathyroidism. PCP:  HPI: QUASON LAURITSEN is a 47 y.o. male with ESRD on HHD, T2DM (diet controlled at this time, but with gastroparesis), and HTN who was admitted with pneumonia.  Reports that symptoms started about 3 days ago. Having fever, chills, nausea, vomiting, and cough. Feels awful and unable to keep down his medications. Presented to ED on evening of 9/12. Noted to be febrile to 102.19F. Non hypoxic on exam. Labs showed Na 134, K 3.9, BUN 29, Cr 8.5, Ca 9, Trop 44 -> 59 -> 40, LA 1.7 -> 1.5, WBC 5.6, Hgb 10.1, Plts 125. CXR and then chest CT confirmed RML pneumonia. COVID testing was negative. Pro-calcitonin 7.7.  Seen in room today - looks acutely ill, having some dyspnea with conversation - informed RN who will start on nasal O2. Has been getting IVF 131mL/hr since admit - stopped today by RN.  Does dialysis at home using [facility]xStage machine - follows [facility] schedule. Full HD yesterday prior to coming in. Self cannulates his R AVF - no recent issues with that.  Past Medical History:  Diagnosis Date   ESRD (end stage renal disease) (HCC)    Hypertension    Past Surgical History:  Procedure Laterality Date   BIOPSY  12/22/2022   Procedure: BIOPSY;  Surgeon: Imogene Burn, MD;  Location: [facility];  Service: Gastroenterology;;   COLONOSCOPY WITH PROPOFOL N/A 12/22/2022   Procedure: COLONOSCOPY WITH PROPOFOL;  Surgeon: Imogene Burn, MD;  Location: [facility];  Service: Gastroenterology;  Laterality: N/A;   ESOPHAGOGASTRODUODENOSCOPY (EGD) WITH PROPOFOL N/A 12/22/2022   Procedure: ESOPHAGOGASTRODUODENOSCOPY (EGD) WITH PROPOFOL;  Surgeon: Imogene Burn, MD;  Location: [facility];  Service: Gastroenterology;  Laterality: N/A;   EYE SURGERY     POLYPECTOMY  12/22/2022   Procedure: POLYPECTOMY;  Surgeon: Imogene Burn, MD;  Location: [facility];  Service: Gastroenterology;;   Family History  Problem Relation Age of Onset   Diabetes Mother    Lung disease Father    Liver disease Neg Hx    Esophageal cancer Neg Hx    Colon cancer Neg Hx    Social History:  reports that he has never smoked. He has never used smokeless tobacco. No history on file for alcohol use and drug use.  ROS: As per HPI otherwise negative.  Physical Exam: Vitals:   01/06/23 0232 01/06/23 0556 01/06/23 0751 01/06/23 0759  BP:  (!) 164/97 (!) 157/99 (!) 151/84  Pulse:  87 85 75  Resp:  18 18 18   Temp:  98.8 F (37.1 C)  98 F (36.7 C)  TempSrc:  Oral    SpO2: 95% 95% 94% 95%  Weight:      Height:         General: Ill appearing man, heavy breathing with conversation, room air Head: Normocephalic, atraumatic, sclera non-icteric, mucus membranes are moist. Neck: Supple without lymphadenopathy/masses. JVD not elevated. Lungs: Scattered wheezing, no rales Heart: RRR with normal S1, S2. No murmurs, rubs, or gallops appreciated. Abdomen: Soft, non-tender, non-distended with normoactive bowel sounds.  Musculoskeletal:  Strength and tone appear normal for age. Lower extremities: No edema or ischemic changes, no open wounds. Neuro: Alert and oriented X 3. Moves all extremities spontaneously. Psych:  Responds to questions appropriately with a normal affect. Dialysis Access: R forearm AVF + bruit  Allergies  Allergen Reactions   Morphine Swelling  Sulfa Antibiotics Swelling   Prior to Admission medications   Medication Sig Start Date End Date Taking? Authorizing Provider  calcitRIOL (ROCALTROL) 0.5 MCG capsule Take 0.5 mcg by mouth daily.   Yes [provider]  carvedilol (COREG) 25 MG tablet Take 25 mg by mouth 2 (two) times daily with a meal.   Yes [provider]  metoCLOPramide (REGLAN) 5 MG tablet Take 5 mg by mouth every 6 (six) hours as needed for nausea or vomiting.   Yes [provider]  NIFEdipine (PROCARDIA XL/NIFEDICAL XL) 60 MG 24 hr tablet Take 60 mg by mouth daily.   Yes [provider]  pantoprazole (PROTONIX) 40 MG tablet Take 1 tablet (40 mg total) by mouth 2 (two) times daily before a meal. 12/22/22  Yes Imogene Burn, MD  amLODipine (NORVASC) 10 MG tablet Take by mouth daily.    [provider]  doxazosin (CARDURA) 2 MG tablet Take 2 mg by mouth at bedtime.    [provider]   Current Facility-Administered Medications  Medication Dose Route Frequency Provider Last Rate Last Admin   0.9 %  sodium chloride infusion  250 mL Intravenous PRN Doutova, Anastassia, MD       acetaminophen (TYLENOL) tablet 650 mg  650 mg Oral Q6H PRN Doutova, Anastassia, MD       Or   acetaminophen (TYLENOL) suppository 650 mg  650 mg Rectal Q6H PRN Doutova, Anastassia, MD       albuterol (PROVENTIL) (2.5 MG/3ML) 0.083% nebulizer solution 2.5 mg  2.5 mg Nebulization Q2H PRN Doutova, Anastassia, MD       amLODipine (NORVASC) tablet 10 mg  10 mg Oral Daily Doutova, Anastassia, MD   10 mg at 01/06/23 1046   azithromycin (ZITHROMAX) tablet 500 mg  500 mg Oral Daily Rodolph Bong, MD       calcitRIOL (ROCALTROL) capsule 0.5 mcg  0.5 mcg Oral Daily Doutova, Anastassia, MD   0.5 mcg at 01/06/23 1046   carvedilol (COREG) tablet 25 mg  25 mg Oral BID WC Doutova, Anastassia, MD   25 mg at 01/06/23 1047   cefTRIAXone (ROCEPHIN) 2 g in sodium chloride 0.9 % 100 mL IVPB  2 g Intravenous Q24H Rodolph Bong, MD       guaiFENesin (MUCINEX) 12 hr tablet 1,200 mg  1,200 mg Oral BID Rodolph Bong, MD   1,200 mg at 01/06/23 1047   HYDROcodone-acetaminophen (NORCO/VICODIN) 5-325 MG per tablet 1-2 tablet  1-2 tablet Oral Q4H PRN Therisa Doyne, MD   1 tablet at 01/06/23 0406   insulin aspart (novoLOG) injection 0-9 Units  0-9 Units Subcutaneous Q4H Doutova, Jonny Ruiz, MD   1 Units at 01/06/23 0507   metoCLOPramide (REGLAN) injection 5 mg  5 mg Intravenous Q6H  Rodolph Bong, MD   5 mg at 01/06/23 1133   ondansetron (ZOFRAN) tablet 4 mg  4 mg Oral Q6H PRN Therisa Doyne, MD       Or   ondansetron (ZOFRAN) injection 4 mg  4 mg Intravenous Q6H PRN Doutova, Anastassia, MD   4 mg at 01/06/23 0827   pantoprazole (PROTONIX) EC tablet 40 mg  40 mg Oral BID AC Doutova, Anastassia, MD   40 mg at 01/06/23 0534   prochlorperazine (COMPAZINE) injection 5 mg  5 mg Intravenous Once Rodolph Bong, MD       sodium chloride flush (NS) 0.9 % injection 3 mL  3 mL Intravenous Q12H Therisa Doyne, MD  3 mL at 01/06/23 1049   sodium chloride flush (NS) 0.9 % injection 3 mL  3 mL Intravenous PRN Therisa Doyne, MD       Labs: Basic Metabolic Panel: Recent Labs  Lab 01/05/23 1717 01/06/23 0657  NA 134* 133*  K 3.9 3.8  CL 97* 97*  CO2 21* 24  GLUCOSE 161* 140*  BUN 29* 38*  CREATININE 8.51* 9.77*  CALCIUM 9.0 8.5*  PHOS  --  5.3*   Liver Function Tests: Recent Labs  Lab 01/05/23 1717 01/06/23 0657  AST 15 12*  ALT 16 14  ALKPHOS 47 33*  BILITOT 0.8 0.4  PROT 7.4 6.3*  ALBUMIN 3.9 3.2*   Recent Labs  Lab 01/05/23 1903  LIPASE 18   CBC: Recent Labs  Lab 01/05/23 1717 01/06/23 0657  WBC 5.6 5.2  NEUTROABS 4.6  --   HGB 10.1* 9.4*  HCT 32.7* 29.7*  MCV 92.9 92.5  PLT 125* 106*   CBG: Recent Labs  Lab 01/06/23 0121 01/06/23 0503 01/06/23 0558 01/06/23 0753 01/06/23 1237  GLUCAP 125* 136* 144* 130* 148*   Iron Studies:  Recent Labs    01/06/23 0653  IRON 7*  TIBC 126*  FERRITIN 544*   Studies/Results: ECHOCARDIOGRAM COMPLETE  Result Date: 01/06/2023    ECHOCARDIOGRAM REPORT   Patient Name:   ERIN KELLIE [facility] Date of Exam: 01/06/2023 Medical Rec #:  [id number]        Height:       69.0 in Accession #:    [id number]       Weight:       207.2 lb Date of Birth:  [birth year]        BSA:          2.097 m Patient Age:    47 years         BP:           151/84 mmHg Patient Gender: M                HR:           89  bpm. Exam Location:  Inpatient Procedure: 2D Echo, Cardiac Doppler and Color Doppler Indications:    Abnormal ECG R94.31  History:        Patient has no prior history of Echocardiogram examinations.                 Risk Factors:Hypertension.  Sonographer:    Harriette Bouillon RDCS Referring Phys: [id number] ANASTASSIA DOUTOVA IMPRESSIONS  1. Left ventricular ejection fraction, by estimation, is 40 to 45%. The left ventricle has mildly decreased function. The left ventricle demonstrates global hypokinesis. There is moderate left ventricular hypertrophy. Left ventricular diastolic parameters are consistent with Grade II diastolic dysfunction (pseudonormalization).  2. Right ventricular systolic function is normal. The right ventricular size is mildly enlarged. Tricuspid regurgitation signal is inadequate for assessing PA pressure.  3. Left atrial size was mildly dilated.  4. The mitral valve is normal in structure. Trivial mitral valve regurgitation. No evidence of mitral stenosis.  5. The aortic valve is tricuspid. Aortic valve regurgitation is not visualized. No aortic stenosis is present.  6. Mildly dilated pulmonary artery.  7. The inferior vena cava is dilated in size with <50% respiratory variability, suggesting right atrial pressure of 15 mmHg. FINDINGS  Left Ventricle: Left ventricular ejection fraction, by estimation, is 40 to 45%. The left ventricle has mildly decreased function. The left ventricle demonstrates global hypokinesis. The left ventricular internal  cavity size was normal in size. There is  moderate left ventricular hypertrophy. Left ventricular diastolic parameters are consistent with Grade II diastolic dysfunction (pseudonormalization). Right Ventricle: The right ventricular size is mildly enlarged. No increase in right ventricular wall thickness. Right ventricular systolic function is normal. Tricuspid regurgitation signal is inadequate for assessing PA pressure. Left Atrium: Left atrial size was mildly  dilated. Right Atrium: Right atrial size was normal in size. Pericardium: Trivial pericardial effusion is present. Mitral Valve: The mitral valve is normal in structure. Trivial mitral valve regurgitation. No evidence of mitral valve stenosis. Tricuspid Valve: The tricuspid valve is normal in structure. Tricuspid valve regurgitation is trivial. No evidence of tricuspid stenosis. Aortic Valve: The aortic valve is tricuspid. Aortic valve regurgitation is not visualized. No aortic stenosis is present. Pulmonic Valve: The pulmonic valve was normal in structure. Pulmonic valve regurgitation is trivial. No evidence of pulmonic stenosis. Aorta: The aortic root is normal in size and structure. Pulmonary Artery: The pulmonary artery is mildly dilated. Venous: The inferior vena cava is dilated in size with less than 50% respiratory variability, suggesting right atrial pressure of 15 mmHg. IAS/Shunts: No atrial level shunt detected by color flow Doppler.  LEFT VENTRICLE PLAX 2D LVIDd:         5.30 cm   Diastology LVIDs:         4.20 cm   LV e' medial:    4.24 cm/s LV PW:         1.30 cm   LV E/e' medial:  26.2 LV IVS:        1.30 cm   LV e' lateral:   6.53 cm/s LVOT diam:     2.20 cm   LV E/e' lateral: 17.0 LV SV:         58 LV SV Index:   28 LVOT Area:     3.80 cm  RIGHT VENTRICLE             IVC RV S prime:     12.00 cm/s  IVC diam: 2.10 cm TAPSE (M-mode): 1.9 cm LEFT ATRIUM             Index        RIGHT ATRIUM           Index LA diam:        4.40 cm 2.10 cm/m   RA Area:     17.20 cm LA Vol (A2C):   91.6 ml 43.67 ml/m  RA Volume:   48.20 ml  22.98 ml/m LA Vol (A4C):   68.3 ml 32.56 ml/m LA Biplane Vol: 79.8 ml 38.05 ml/m  AORTIC VALVE LVOT Vmax:   94.20 cm/s LVOT Vmean:  60.400 cm/s LVOT VTI:    0.153 m  AORTA Ao Root diam: 3.10 cm Ao Asc diam:  3.20 cm MITRAL VALVE MV Area (PHT): 5.31 cm     SHUNTS MV Decel Time: 143 msec     Systemic VTI:  0.15 m MV E velocity: 111.00 cm/s  Systemic Diam: 2.20 cm MV A velocity:  60.60 cm/s MV E/A ratio:  1.83 Weston Brass MD Electronically signed by Weston Brass MD Signature Date/Time: 01/06/2023/12:07:18 PM    Final    CT ABDOMEN PELVIS WO CONTRAST  Result Date: 01/05/2023 CLINICAL DATA:  Abdominal pain. EXAM: CT ABDOMEN AND PELVIS WITHOUT CONTRAST TECHNIQUE: Multidetector CT imaging of the abdomen and pelvis was performed following the standard protocol without IV contrast. RADIATION DOSE REDUCTION: This exam was performed according to the  departmental dose-optimization program which includes automated exposure control, adjustment of the mA and/or kV according to patient size and/or use of iterative reconstruction technique. COMPARISON:  None Available. FINDINGS: Lower chest: There is mild cardiomegaly with moderate to marked severity right middle lobe infiltrate. A small right pleural effusion is seen. Hepatobiliary: No focal liver abnormality is seen. There is a mild amount of perihepatic fluid. Multiple tiny gallstones are seen within the dependent portion of the gallbladder lumen. Pancreas: Unremarkable. No pancreatic ductal dilatation or surrounding inflammatory changes. Spleen: Normal in size without focal abnormality. Adrenals/Urinary Tract: Adrenal glands are unremarkable. Kidneys are normal, without obstructing renal calculi, focal lesion, or hydronephrosis. Renal vascular calcifications are noted. The urinary bladder is poorly distended and subsequently limited in evaluation. Moderate severity diffuse urinary bladder wall thickening is noted. Stomach/Bowel: Stomach is within normal limits. Appendix appears normal. No evidence of bowel wall thickening, distention, or inflammatory changes. Vascular/Lymphatic: Aortic atherosclerosis. No enlarged abdominal or pelvic lymph nodes. Reproductive: Prostate is unremarkable. Other: No abdominal wall hernia or abnormality. No abdominopelvic ascites. Musculoskeletal: No acute or significant osseous findings. IMPRESSION: 1. Moderate  to marked severity right middle lobe infiltrate. 2. Small right pleural effusion. 3. Cholelithiasis. 4. Moderate severity diffuse urinary bladder wall thickening which may be secondary to chronic bladder outlet obstruction. Correlation with urinalysis is recommended to exclude the presence of acute cystitis. 5. Aortic atherosclerosis. Aortic Atherosclerosis (ICD10-I70.0). Electronically Signed   By: Aram Candela M.D.   On: 01/05/2023 21:09   DG Chest 2 View  Result Date: 01/05/2023 CLINICAL DATA:  Suspect sepsis EXAM: CHEST - 2 VIEW COMPARISON:  Chest x-ray 09/12/2007 FINDINGS: There is airspace disease in the right middle lobe. The cardiomediastinal silhouette is within normal limits. No pleural effusion or pneumothorax. No acute fractures. IMPRESSION: Right middle lobe pneumonia. Follow-up imaging recommended in 4-6 weeks to confirm resolution. Electronically Signed   By: Darliss Cheney M.D.   On: 01/05/2023 19:47    Dialysis Orders:  [facility] HHD - [facility]. Follows at [facility] - [phone] - 2881 3:22hr, 1K bath, EDW 91kg, AVF, heparin 6000 unit bolus - Mircera q month - last given 8/15  Assessment/Plan:  RML HCAP: Started on Ceftriaxone/Azithro. Has been on RA, but breathing heavy during our conversation - d/w RN to start nasal O2. Likely more c/w his pneumonia rather than volume - will check on him later - can dialyze early if need.  Elevated troponin: EKG without acute ST changes, echo with EF 40-45%, no WMA  ESRD:  [facility] schedule at home - full HD yesterday -> next planned for tomorrow.  Hypertension/volume: BP high - UF to his dry weight tomorrow and follow.  Anemia of ESRD: Hgb 9.4 - due for ESA, will order to be given tonight.  Metabolic bone disease: Ca/Phos ok - continue home meds  Nutrition:  Alb low, adding protein supps.  Ozzie Hoyle, PA-C 01/06/2023, 1:15 PM  [employer]

## 2023-01-06 NOTE — Assessment & Plan Note (Signed)
Continue Reglan patient has history of gastroparesis Continue Protonix. Denies any diarrhea CT abdomen unremarkable nonacute No elevation of LFTs

## 2023-01-06 NOTE — Progress Notes (Signed)
Initial Nutrition Assessment  DOCUMENTATION CODES:   Obesity unspecified  INTERVENTION:  Ensure TID   NUTRITION DIAGNOSIS:   Increased nutrient needs related to ESRD, HD dependent as evidenced by estimated needs.    GOAL:   Patient will meet greater than or equal to 90% of their needs    MONITOR:   PO intake, Labs, Supplement acceptance  REASON FOR ASSESSMENT:   Consult Assessment of nutrition requirement/status  ASSESSMENT:   Pt admitted for CAP with nausea and vomiting, Pmhx; ESRD, HTN, DM2. HD dependent treatment yesterday. Nausea and vomiting noted x 3-4 days. SLP followed up with evaluation after RD visited. Wife at bedside at time of visit. Pt stated that no appetite for 3 to 4 days due to being nausea and vomiting. HD dependent with 3hrs treatment yesterday. Has urine out put. Stated usual dry weight is 91.6kg. No weight loss stated. Wife stated that he usually eats three meals a day and has snacks in evening. They do not eat pork or beef. NKFA.  Meds; Norvasc, calcitRIOL, novoLOG, zofran phenergran Labs, Na;133, Cl:97, gluc: 140, Phos: 5.3,     NUTRITION - FOCUSED PHYSICAL EXAM:  Flowsheet Row Most Recent Value  Orbital Region No depletion  Upper Arm Region No depletion  Thoracic and Lumbar Region No depletion  Buccal Region No depletion  Temple Region No depletion  Clavicle Bone Region No depletion  Clavicle and Acromion Bone Region No depletion  Scapular Bone Region No depletion  Dorsal Hand No depletion  Patellar Region No depletion  Anterior Thigh Region No depletion  Posterior Calf Region No depletion  Edema (RD Assessment) None  Hair Reviewed  Eyes Reviewed  Mouth Reviewed  Skin Reviewed  Nails Reviewed       Diet Order:   Diet Order             Diet regular Room service appropriate? Yes; Fluid consistency: Thin  Diet effective now                   EDUCATION NEEDS:   No education needs have been identified at this  time  Skin:  Skin Assessment: Reviewed RN Assessment  Last BM:  9/11  Height:   Ht Readings from Last 1 Encounters:  01/05/23 5\' 9"  (1.753 m)    Weight:   Wt Readings from Last 1 Encounters:  01/05/23 94 kg    Ideal Body Weight:  72 kg  BMI:  Body mass index is 30.6 kg/m.  Estimated Nutritional Needs:   Kcal:  2500-2700  Protein:  90-118  Fluid:  2000-2500 (or per team)    Ricardo Jericho, RDN, LDN

## 2023-01-06 NOTE — Progress Notes (Signed)
PROGRESS NOTE    [name]  RJJ:[id number] DOB: 11-01-1975 DOA: 01/05/2023 PCP: Starla Link, MD    No chief complaint on file.   Brief Narrative:  Patient 47 year old gentleman history of ESRD on dialysis TTS, history of hypertension, diabetes mellitus type 2, gastroparesis, presented to the ED with 3-day history of fever of 101.2, nausea vomiting, productive cough of clear sputum and noted on chest x-ray and CT abdomen and pelvis to have a right middle lobe pneumonia.  COVID-19 PCR negative.  Patient admitted placed empirically on IV antibiotics.  2D echo obtained noted a EF of 40 to 45% with global hypokinesis.  Cardiology consulted.  Nephrology consulted.   Assessment & Plan:   Principal Problem:   CAP (community acquired pneumonia) Active Problems:   Nausea & vomiting   Elevated troponin   ESRD (end stage renal disease) (HCC)   Abnormal echocardiogram   Hypertension   Anemia of chronic disease   Type 2 diabetes mellitus without complication, without long-term current use of insulin (HCC)   Abnormal TSH   Gastroparesis   Dilated cardiomyopathy (HCC)  #1 right middle lobe CAP, POA -Patient presenting with a productive cough of clear sputum, fever, nausea and vomiting x 3 days. -Chest x-ray done concerning for right middle lobe pneumonia and patient noted to have 2 or 3 characteristics of fever, leukocytosis, purulent sputum. -SARS coronavirus 2 PCR negative. -Sputum cultures pending. -Blood cultures pending. -Urine Legionella antigen pending. -Urine pneumococcus antigen pending. -Continue azithromycin. -Increase Rocephin to 2 g daily. -Continue Mucinex, PPI.  2.  Nausea vomiting/history of gastroparesis -Patient with nausea vomiting noted to be on Reglan prior to admission. -CT abdomen and pelvis done with cholelithiasis, moderate severe eating diet with urinary bladder wall thickening which may be secondary to chronic bladder outlet obstruction.  Aortic  atherosclerosis.  Small right pleural effusion.  Moderate to marked severity right middle lobe infiltrate. -Due to nausea and vomiting will change oral Reglan to IV Reglan 5 mg every 6 hours. -Supportive care.  3.  Abnormal 2D echo EF 40 to 45%, dilated cardiomyopathy -Patient noted to have minimally elevated troponins on admission, 2D echo was obtained which showed a EF of 40 to 45%, global hypokinesis, grade 2 diastolic dysfunction, mildly dilated left atrial size, trivial MVR, mildly dilated pulmonary artery. -Due to abnormal 2D echo cardiology consulted will assess patient and feel no indication for ischemic workup at this time with no acute EKG changes and troponin minimally elevated. -Cardiology recommended discontinuation of patient's Norvasc and Procardia. -Entresto started per cardiology we are recommending repeat 2D echo in 3 months to see if any improvement. -Patient noted to be on Coreg 25 mg twice daily prior to admission which we will continue. -Appreciate cardiology input and recommendations.  4.  ESRD on HHD MTuThF -Patient noted to have had full hemodialysis yesterday. -Nephrology consulted and following and planning for hemodialysis tomorrow.  5.  Hypertension -Norvasc and Procardia discontinued per cardiology recommendations. -Continue home regimen Coreg. -Patient started on Entresto per cardiology.  6.  Anemia of ESRD -Hemoglobin currently at 9.4. -Per nephrology.  7.  Diabetes mellitus type 2 -Check a hemoglobin A1c. -CBG noted at 130 this morning. -SSI  8.  Abnormal TSH -TSH noted at 16.771. -Check a free T4. -Will need repeat labs done in the outpatient setting in 4 to 6 weeks.    DVT prophylaxis: SCDs Code Status: Full Family Communication: Updated patient.  No family at bedside. Disposition: Home when clinically improved.  Status  is: Inpatient Remains inpatient appropriate because: Severity of illness   Consultants:  Nephrology: Dr. [name]  01/06/2023 Cardiology: Dr. Eden Emms 01/06/2023  Procedures:  CT abdomen pelvis 01/05/2023 Chest x-ray 01/05/2023 2D echo 01/06/2023  Antimicrobials:  Anti-infectives (From admission, onward)    Start     Dose/Rate Route Frequency Ordered Stop   01/06/23 2200  azithromycin (ZITHROMAX) tablet 500 mg        500 mg Oral Daily 01/06/23 1126     01/06/23 2000  cefTRIAXone (ROCEPHIN) 2 g in sodium chloride 0.9 % 100 mL IVPB        2 g 200 mL/hr over 30 Minutes Intravenous Every 24 hours 01/06/23 0903     01/05/23 2015  azithromycin (ZITHROMAX) 500 mg in sodium chloride 0.9 % 250 mL IVPB  Status:  Discontinued        500 mg 250 mL/hr over 60 Minutes Intravenous Every 24 hours 01/05/23 2014 01/06/23 1126   01/05/23 2015  cefTRIAXone (ROCEPHIN) 1 g in sodium chloride 0.9 % 100 mL IVPB  Status:  Discontinued        1 g 200 mL/hr over 30 Minutes Intravenous Every 24 hours 01/05/23 2014 01/06/23 0903         Subjective: Patient sleepy but arousable.  Noted to have received some Compazine earlier on which helped his nausea.  Denies any chest pain.  Still with some shortness of breath.  No abdominal pain.  Objective: Vitals:   01/06/23 0556 01/06/23 0751 01/06/23 0759 01/06/23 1647  BP: (!) 164/97 (!) 157/99 (!) 151/84 (!) 146/81  Pulse: 87 85 75 83  Resp: 18 18 18 18   Temp: 98.8 F (37.1 C)  98 F (36.7 C)   TempSrc: Oral     SpO2: 95% 94% 95% 96%  Weight:      Height:        Intake/Output Summary (Last 24 hours) at 01/06/2023 1719 Last data filed at 01/06/2023 0543 Gross per 24 hour  Intake 1076.26 ml  Output --  Net 1076.26 ml   Filed Weights   01/05/23 2330  Weight: 94 kg    Examination:  General exam: Appears calm and comfortable  Respiratory system: Clear to auscultation.  No wheezes, no crackles, no rhonchi.  Fair air movement.  Speaking in full sentences.  Respiratory effort normal. Cardiovascular system: S1 & S2 heard, RRR. No JVD, murmurs, rubs, gallops or clicks. No  pedal edema. Gastrointestinal system: Abdomen is nondistended, soft and nontender. No organomegaly or masses felt. Normal bowel sounds heard. Central nervous system: Alert and oriented. No focal neurological deficits. Extremities: Symmetric 5 x 5 power. Skin: No rashes, lesions or ulcers Psychiatry: Judgement and insight appear normal. Mood & affect appropriate.     Data Reviewed: I have personally reviewed following labs and imaging studies  CBC: Recent Labs  Lab 01/05/23 1717 01/06/23 0657  WBC 5.6 5.2  NEUTROABS 4.6  --   HGB 10.1* 9.4*  HCT 32.7* 29.7*  MCV 92.9 92.5  PLT 125* 106*    Basic Metabolic Panel: Recent Labs  Lab 01/05/23 1717 01/06/23 0657  NA 134* 133*  K 3.9 3.8  CL 97* 97*  CO2 21* 24  GLUCOSE 161* 140*  BUN 29* 38*  CREATININE 8.51* 9.77*  CALCIUM 9.0 8.5*  MG  --  2.1  PHOS  --  5.3*    GFR: Estimated Creatinine Clearance: 10.7 mL/min (A) (by C-G formula based on SCr of 9.77 mg/dL (H)).  Liver Function Tests: Recent  Labs  Lab 01/05/23 1717 01/06/23 0657  AST 15 12*  ALT 16 14  ALKPHOS 47 33*  BILITOT 0.8 0.4  PROT 7.4 6.3*  ALBUMIN 3.9 3.2*    CBG: Recent Labs  Lab 01/06/23 0503 01/06/23 0558 01/06/23 0753 01/06/23 1237 01/06/23 1648  GLUCAP 136* 144* 130* 148* 162*     Recent Results (from the past 240 hour(s))  Culture, blood (Routine x 2)     Status: None (Preliminary result)   Collection Time: 01/05/23  5:18 PM   Specimen: BLOOD  Result Value Ref Range Status   Specimen Description BLOOD SITE NOT SPECIFIED  Final   Special Requests   Final    BOTTLES DRAWN AEROBIC AND ANAEROBIC Blood Culture results may not be optimal due to an inadequate volume of blood received in culture bottles   Culture   Final    NO GROWTH < 24 HOURS Performed at [facility], [address], [place], [place] [address]    Report Status PENDING  Incomplete  Culture, blood (Routine x 2)     Status: None (Preliminary result)    Collection Time: 01/05/23  7:03 PM   Specimen: BLOOD  Result Value Ref Range Status   Specimen Description BLOOD SITE NOT SPECIFIED  Final   Special Requests   Final    BOTTLES DRAWN AEROBIC AND ANAEROBIC Blood Culture adequate volume   Culture   Final    NO GROWTH < 24 HOURS Performed at [facility], [address] [place], [place] [address]    Report Status PENDING  Incomplete  SARS Coronavirus 2 by RT PCR (hospital order, performed in [facility] hospital lab) *cepheid single result test* Anterior Nasal Swab     Status: None   Collection Time: 01/05/23  8:01 PM   Specimen: Anterior Nasal Swab  Result Value Ref Range Status   SARS Coronavirus 2 by RT PCR NEGATIVE NEGATIVE Final    Comment: Performed at [facility], [address], [place], [place] [address]  Expectorated Sputum Assessment w Gram Stain, Rflx to Resp Cult     Status: None   Collection Time: 01/06/23 12:50 AM   Specimen: Expectorated Sputum  Result Value Ref Range Status   Specimen Description EXPECTORATED SPUTUM  Final   Special Requests NONE  Final   Sputum evaluation   Final    THIS SPECIMEN IS ACCEPTABLE FOR SPUTUM CULTURE Performed at [facility], [address], [place], [place] [address]    Report Status 01/06/2023 FINAL  Final  Culture, Respiratory w Gram Stain     Status: None (Preliminary result)   Collection Time: 01/06/23 12:50 AM  Result Value Ref Range Status   Specimen Description EXPECTORATED SPUTUM  Final   Special Requests NONE Reflexed from [id number]  Final   Gram Stain   Final    ABUNDANT WBC PRESENT,BOTH PMN AND MONONUCLEAR FEW GRAM VARIABLE ROD FEW GRAM POSITIVE COCCI IN PAIRS IN SINGLES Performed at [facility], [address], [place], [place] [address]    Culture PENDING  Incomplete   Report Status PENDING  Incomplete         Radiology Studies: ECHOCARDIOGRAM COMPLETE  Result Date: 01/06/2023    ECHOCARDIOGRAM REPORT   Patient Name:   BRODY HILLERY [facility]  Date of Exam: 01/06/2023 Medical Rec #:  [id number]        Height:       69.0 in Accession #:  [id number]       Weight:       207.2 lb Date of Birth:  [birth year]        BSA:          2.097 m Patient Age:    47 years         BP:           151/84 mmHg Patient Gender: M                HR:           89 bpm. Exam Location:  Inpatient Procedure: 2D Echo, Cardiac Doppler and Color Doppler Indications:    Abnormal ECG R94.31  History:        Patient has no prior history of Echocardiogram examinations.                 Risk Factors:Hypertension.  Sonographer:    Harriette Bouillon RDCS Referring Phys: [id number] ANASTASSIA DOUTOVA IMPRESSIONS  1. Left ventricular ejection fraction, by estimation, is 40 to 45%. The left ventricle has mildly decreased function. The left ventricle demonstrates global hypokinesis. There is moderate left ventricular hypertrophy. Left ventricular diastolic parameters are consistent with Grade II diastolic dysfunction (pseudonormalization).  2. Right ventricular systolic function is normal. The right ventricular size is mildly enlarged. Tricuspid regurgitation signal is inadequate for assessing PA pressure.  3. Left atrial size was mildly dilated.  4. The mitral valve is normal in structure. Trivial mitral valve regurgitation. No evidence of mitral stenosis.  5. The aortic valve is tricuspid. Aortic valve regurgitation is not visualized. No aortic stenosis is present.  6. Mildly dilated pulmonary artery.  7. The inferior vena cava is dilated in size with <50% respiratory variability, suggesting right atrial pressure of 15 mmHg. FINDINGS  Left Ventricle: Left ventricular ejection fraction, by estimation, is 40 to 45%. The left ventricle has mildly decreased function. The left ventricle demonstrates global hypokinesis. The left ventricular internal cavity size was normal in size. There is  moderate left ventricular hypertrophy. Left ventricular diastolic parameters are consistent with Grade II diastolic  dysfunction (pseudonormalization). Right Ventricle: The right ventricular size is mildly enlarged. No increase in right ventricular wall thickness. Right ventricular systolic function is normal. Tricuspid regurgitation signal is inadequate for assessing PA pressure. Left Atrium: Left atrial size was mildly dilated. Right Atrium: Right atrial size was normal in size. Pericardium: Trivial pericardial effusion is present. Mitral Valve: The mitral valve is normal in structure. Trivial mitral valve regurgitation. No evidence of mitral valve stenosis. Tricuspid Valve: The tricuspid valve is normal in structure. Tricuspid valve regurgitation is trivial. No evidence of tricuspid stenosis. Aortic Valve: The aortic valve is tricuspid. Aortic valve regurgitation is not visualized. No aortic stenosis is present. Pulmonic Valve: The pulmonic valve was normal in structure. Pulmonic valve regurgitation is trivial. No evidence of pulmonic stenosis. Aorta: The aortic root is normal in size and structure. Pulmonary Artery: The pulmonary artery is mildly dilated. Venous: The inferior vena cava is dilated in size with less than 50% respiratory variability, suggesting right atrial pressure of 15 mmHg. IAS/Shunts: No atrial level shunt detected by color flow Doppler.  LEFT VENTRICLE PLAX 2D LVIDd:         5.30 cm   Diastology LVIDs:         4.20 cm   LV e' medial:    4.24 cm/s LV PW:         1.30 cm   LV E/e' medial:  26.2  LV IVS:        1.30 cm   LV e' lateral:   6.53 cm/s LVOT diam:     2.20 cm   LV E/e' lateral: 17.0 LV SV:         58 LV SV Index:   28 LVOT Area:     3.80 cm  RIGHT VENTRICLE             IVC RV S prime:     12.00 cm/s  IVC diam: 2.10 cm TAPSE (M-mode): 1.9 cm LEFT ATRIUM             Index        RIGHT ATRIUM           Index LA diam:        4.40 cm 2.10 cm/m   RA Area:     17.20 cm LA Vol (A2C):   91.6 ml 43.67 ml/m  RA Volume:   48.20 ml  22.98 ml/m LA Vol (A4C):   68.3 ml 32.56 ml/m LA Biplane Vol: 79.8 ml 38.05  ml/m  AORTIC VALVE LVOT Vmax:   94.20 cm/s LVOT Vmean:  60.400 cm/s LVOT VTI:    0.153 m  AORTA Ao Root diam: 3.10 cm Ao Asc diam:  3.20 cm MITRAL VALVE MV Area (PHT): 5.31 cm     SHUNTS MV Decel Time: 143 msec     Systemic VTI:  0.15 m MV E velocity: 111.00 cm/s  Systemic Diam: 2.20 cm MV A velocity: 60.60 cm/s MV E/A ratio:  1.83 Weston Brass MD Electronically signed by Weston Brass MD Signature Date/Time: 01/06/2023/12:07:18 PM    Final    CT ABDOMEN PELVIS WO CONTRAST  Result Date: 01/05/2023 CLINICAL DATA:  Abdominal pain. EXAM: CT ABDOMEN AND PELVIS WITHOUT CONTRAST TECHNIQUE: Multidetector CT imaging of the abdomen and pelvis was performed following the standard protocol without IV contrast. RADIATION DOSE REDUCTION: This exam was performed according to the departmental dose-optimization program which includes automated exposure control, adjustment of the mA and/or kV according to patient size and/or use of iterative reconstruction technique. COMPARISON:  None Available. FINDINGS: Lower chest: There is mild cardiomegaly with moderate to marked severity right middle lobe infiltrate. A small right pleural effusion is seen. Hepatobiliary: No focal liver abnormality is seen. There is a mild amount of perihepatic fluid. Multiple tiny gallstones are seen within the dependent portion of the gallbladder lumen. Pancreas: Unremarkable. No pancreatic ductal dilatation or surrounding inflammatory changes. Spleen: Normal in size without focal abnormality. Adrenals/Urinary Tract: Adrenal glands are unremarkable. Kidneys are normal, without obstructing renal calculi, focal lesion, or hydronephrosis. Renal vascular calcifications are noted. The urinary bladder is poorly distended and subsequently limited in evaluation. Moderate severity diffuse urinary bladder wall thickening is noted. Stomach/Bowel: Stomach is within normal limits. Appendix appears normal. No evidence of bowel wall thickening, distention, or  inflammatory changes. Vascular/Lymphatic: Aortic atherosclerosis. No enlarged abdominal or pelvic lymph nodes. Reproductive: Prostate is unremarkable. Other: No abdominal wall hernia or abnormality. No abdominopelvic ascites. Musculoskeletal: No acute or significant osseous findings. IMPRESSION: 1. Moderate to marked severity right middle lobe infiltrate. 2. Small right pleural effusion. 3. Cholelithiasis. 4. Moderate severity diffuse urinary bladder wall thickening which may be secondary to chronic bladder outlet obstruction. Correlation with urinalysis is recommended to exclude the presence of acute cystitis. 5. Aortic atherosclerosis. Aortic Atherosclerosis (ICD10-I70.0). Electronically Signed   By: Aram Candela M.D.   On: 01/05/2023 21:09   DG Chest 2 View  Result Date:  01/05/2023 CLINICAL DATA:  Suspect sepsis EXAM: CHEST - 2 VIEW COMPARISON:  Chest x-ray 09/12/2007 FINDINGS: There is airspace disease in the right middle lobe. The cardiomediastinal silhouette is within normal limits. No pleural effusion or pneumothorax. No acute fractures. IMPRESSION: Right middle lobe pneumonia. Follow-up imaging recommended in 4-6 weeks to confirm resolution. Electronically Signed   By: Darliss Cheney M.D.   On: 01/05/2023 19:47        Scheduled Meds:  (feeding supplement) PROSource Plus  30 mL Oral BID BM   azithromycin  500 mg Oral Daily   calcitRIOL  0.5 mcg Oral Daily   carvedilol  25 mg Oral BID WC   [START ON 01/07/2023] Chlorhexidine Gluconate Cloth  6 each Topical Q0600   darbepoetin (ARANESP) injection - DIALYSIS  60 mcg Subcutaneous Q Fri-1800   feeding supplement  237 mL Oral TID BM   guaiFENesin  1,200 mg Oral BID   insulin aspart  0-9 Units Subcutaneous Q4H   metoCLOPramide (REGLAN) injection  5 mg Intravenous Q6H   pantoprazole  40 mg Oral BID AC   sacubitril-valsartan  1 tablet Oral BID   sodium chloride flush  3 mL Intravenous Q12H   Continuous Infusions:  sodium chloride      cefTRIAXone (ROCEPHIN)  IV       LOS: 0 days    Time spent: 40 minutes    Ramiro Harvest, MD [facility]   To contact the attending provider between 7A-7P or the covering provider during after hours 7P-7A, please log into the web site [email or web address] and access using universal [place] password for that web site. If you do not have the password, please call the hospital operator.  01/06/2023, 5:19 PM

## 2023-01-06 NOTE — Plan of Care (Signed)

## 2023-01-06 NOTE — Assessment & Plan Note (Signed)
In the setting of renal failure Likely demand in the setting of pneumonia Continue serial EKGs continue to cycle cardiac enzymes obtain echo in the morning for completion

## 2023-01-06 NOTE — Assessment & Plan Note (Signed)
- -  Patient presenting with  productive cough, fever     -Infiltrate on CXR and 2-3 characteristics (fever, leukocytosis, purulent sputum) are consistent with pneumonia. -This appears to be most likely community-acquired pneumonia.   -   will admit for treatment of CAP will start on appropriate antibiotic coverage. - Rocephin/azithromycin   Obtain:  sputum cultures,                  Obtain respiratory panel                    COVID PCR negative                    blood cultures and sputum cultures ordered                   strep pneumo UA antigen,                  check for Legionella antigen.                Provide oxygen as needed.

## 2023-01-07 DIAGNOSIS — J189 Pneumonia, unspecified organism: Secondary | ICD-10-CM | POA: Diagnosis not present

## 2023-01-07 DIAGNOSIS — N186 End stage renal disease: Secondary | ICD-10-CM | POA: Diagnosis not present

## 2023-01-07 DIAGNOSIS — R931 Abnormal findings on diagnostic imaging of heart and coronary circulation: Secondary | ICD-10-CM | POA: Diagnosis not present

## 2023-01-07 DIAGNOSIS — I428 Other cardiomyopathies: Secondary | ICD-10-CM

## 2023-01-07 DIAGNOSIS — R7989 Other specified abnormal findings of blood chemistry: Secondary | ICD-10-CM | POA: Diagnosis not present

## 2023-01-07 LAB — CBC WITH DIFFERENTIAL/PLATELET
Abs Immature Granulocytes: 0.02 10*3/uL (ref 0.00–0.07)
Basophils Absolute: 0 10*3/uL (ref 0.0–0.1)
Basophils Relative: 0 %
Eosinophils Absolute: 0 10*3/uL (ref 0.0–0.5)
Eosinophils Relative: 0 %
HCT: 30.9 % — ABNORMAL LOW (ref 39.0–52.0)
Hemoglobin: 9.7 g/dL — ABNORMAL LOW (ref 13.0–17.0)
Immature Granulocytes: 0 %
Lymphocytes Relative: 10 %
Lymphs Abs: 0.7 10*3/uL (ref 0.7–4.0)
MCH: 28.5 pg (ref 26.0–34.0)
MCHC: 31.4 g/dL (ref 30.0–36.0)
MCV: 90.9 fL (ref 80.0–100.0)
Monocytes Absolute: 0.9 10*3/uL (ref 0.1–1.0)
Monocytes Relative: 13 %
Neutro Abs: 5.2 10*3/uL (ref 1.7–7.7)
Neutrophils Relative %: 77 %
Platelets: 122 10*3/uL — ABNORMAL LOW (ref 150–400)
RBC: 3.4 MIL/uL — ABNORMAL LOW (ref 4.22–5.81)
RDW: 14.6 % (ref 11.5–15.5)
WBC: 6.8 10*3/uL (ref 4.0–10.5)
nRBC: 0 % (ref 0.0–0.2)

## 2023-01-07 LAB — RENAL FUNCTION PANEL
Albumin: 2.9 g/dL — ABNORMAL LOW (ref 3.5–5.0)
Anion gap: 14 (ref 5–15)
BUN: 51 mg/dL — ABNORMAL HIGH (ref 6–20)
CO2: 21 mmol/L — ABNORMAL LOW (ref 22–32)
Calcium: 8.2 mg/dL — ABNORMAL LOW (ref 8.9–10.3)
Chloride: 100 mmol/L (ref 98–111)
Creatinine, Ser: 11.59 mg/dL — ABNORMAL HIGH (ref 0.61–1.24)
GFR, Estimated: 5 mL/min — ABNORMAL LOW (ref >60)
Glucose, Bld: 124 mg/dL — ABNORMAL HIGH (ref 70–99)
Phosphorus: 6 mg/dL — ABNORMAL HIGH (ref 2.5–4.6)
Potassium: 3.9 mmol/L (ref 3.5–5.1)
Sodium: 135 mmol/L (ref 135–145)

## 2023-01-07 LAB — T4, FREE: Free T4: 0.69 ng/dL (ref 0.61–1.12)

## 2023-01-07 LAB — HEMOGLOBIN A1C
Hgb A1c MFr Bld: 5.3 % (ref 4.8–5.6)
Mean Plasma Glucose: 105 mg/dL

## 2023-01-07 LAB — GLUCOSE, CAPILLARY
Glucose-Capillary: 116 mg/dL — ABNORMAL HIGH (ref 70–99)
Glucose-Capillary: 125 mg/dL — ABNORMAL HIGH (ref 70–99)
Glucose-Capillary: 125 mg/dL — ABNORMAL HIGH (ref 70–99)
Glucose-Capillary: 139 mg/dL — ABNORMAL HIGH (ref 70–99)
Glucose-Capillary: 98 mg/dL (ref 70–99)

## 2023-01-07 LAB — HEPATITIS B SURFACE ANTIBODY, QUANTITATIVE: Hep B S AB Quant (Post): <3.5 m[IU]/mL — ABNORMAL LOW

## 2023-01-07 MED ORDER — METOCLOPRAMIDE HCL 5 MG/ML IJ SOLN
10.0000 mg | Freq: Four times a day (QID) | INTRAMUSCULAR | Status: DC
  Administered 2023-01-07 – 2023-01-12 (×19): 10 mg via INTRAVENOUS
  Filled 2023-01-07 (×22): qty 2

## 2023-01-07 MED ORDER — METOPROLOL TARTRATE 5 MG/5ML IV SOLN
5.0000 mg | Freq: Three times a day (TID) | INTRAVENOUS | Status: DC
  Administered 2023-01-07 – 2023-01-08 (×3): 5 mg via INTRAVENOUS
  Filled 2023-01-07 (×3): qty 5

## 2023-01-07 MED ORDER — SODIUM CHLORIDE 0.9 % IV SOLN
12.5000 mg | Freq: Once | INTRAVENOUS | Status: AC
  Administered 2023-01-07: 12.5 mg via INTRAVENOUS
  Filled 2023-01-07: qty 0.5

## 2023-01-07 MED ORDER — PROCHLORPERAZINE EDISYLATE 10 MG/2ML IJ SOLN
5.0000 mg | Freq: Four times a day (QID) | INTRAMUSCULAR | Status: DC | PRN
  Administered 2023-01-07 – 2023-01-08 (×3): 5 mg via INTRAVENOUS
  Filled 2023-01-07 (×2): qty 2

## 2023-01-07 MED ORDER — PANTOPRAZOLE SODIUM 40 MG IV SOLR
40.0000 mg | Freq: Two times a day (BID) | INTRAVENOUS | Status: DC
  Administered 2023-01-07 – 2023-01-08 (×3): 40 mg via INTRAVENOUS
  Filled 2023-01-07 (×3): qty 10

## 2023-01-07 MED ORDER — HYDROCODONE BIT-HOMATROP MBR 5-1.5 MG/5ML PO SOLN: 5.0000 mL | Freq: Four times a day (QID) | ORAL | Status: DC | PRN

## 2023-01-07 NOTE — Plan of Care (Signed)

## 2023-01-07 NOTE — Progress Notes (Signed)
   01/07/23 1432  Vitals  Temp 98 F (36.7 C)  Pulse Rate 79  Resp 15  BP (!) 174/92  SpO2 97 %  O2 Device Nasal Cannula  Weight 91 kg  Type of Weight Post-Dialysis  Oxygen Therapy  O2 Flow Rate (L/min) 2 L/min  Patient Activity (if Appropriate) In bed  Pulse Oximetry Type Continuous  Oximetry Probe Site Changed No  Post Treatment  Dialyzer Clearance Lightly streaked  Hemodialysis Intake (mL) 0 mL  Liters Processed 77.4  Fluid Removed (mL) 2400 mL  Tolerated HD Treatment Yes  Post-Hemodialysis Comments pt came off early secondary to dark circuit  AVG/AVF Arterial Site Held (minutes) 10 minutes  AVG/AVF Venous Site Held (minutes) 10 minutes   Received patient in bed to unit.  Alert and oriented.  Informed consent signed and in chart.   TX duration:3.45  Patient tolerated well.  Transported back to the room  Alert, without acute distress.  Hand-off given to patient's nurse.   Access used: RUAF Access issues: no complications  Total UF removed: 2400 Medication(s) given: phenerghen iv x 1 per order [facility]

## 2023-01-07 NOTE — Progress Notes (Addendum)
PROGRESS NOTE    [name]  QMV:[id number] DOB: 1976-01-22 DOA: 01/05/2023 PCP: Starla Link, MD    No chief complaint on file.   Brief Narrative:  Patient 47 year old gentleman history of ESRD on dialysis TTS, history of hypertension, diabetes mellitus type 2, gastroparesis, presented to the ED with 3-day history of fever of 101.2, nausea vomiting, productive cough of clear sputum and noted on chest x-ray and CT abdomen and pelvis to have a right middle lobe pneumonia.  COVID-19 PCR negative.  Patient admitted placed empirically on IV antibiotics.  2D echo obtained noted a EF of 40 to 45% with global hypokinesis.  Cardiology consulted.  Nephrology consulted.   Assessment & Plan:   Principal Problem:   CAP (community acquired pneumonia) Active Problems:   Nausea & vomiting   Elevated troponin   ESRD (end stage renal disease) (HCC)   Abnormal echocardiogram   Hypertension   Anemia of chronic disease   Type 2 diabetes mellitus without complication, without long-term current use of insulin (HCC)   Abnormal TSH   Gastroparesis   Dilated cardiomyopathy (HCC)  #1 right middle lobe CAP, POA -Patient presenting with a productive cough of clear sputum, fever, nausea and vomiting x 3 days. -Chest x-ray done concerning for right middle lobe pneumonia and patient noted to have 2 or 3 characteristics of fever, leukocytosis, purulent sputum. -SARS coronavirus 2 PCR negative. -Sputum cultures pending. -Blood cultures pending. -Urine Legionella antigen pending. -Urine pneumococcus antigen negative. -Continue azithromycin and IV Rocephin, Mucinex, PPI..  2.  Nausea vomiting/history of gastroparesis -Patient with nausea vomiting noted to be on Reglan prior to admission. -CT abdomen and pelvis done with cholelithiasis, moderate severe eating diet with urinary bladder wall thickening which may be secondary to chronic bladder outlet obstruction.  Aortic atherosclerosis.  Small right  pleural effusion.  Moderate to marked severity right middle lobe infiltrate. -Due to nausea and vomiting patient placed on Reglan 5 mg IV every 6 hours.  -As patient with ongoing emesis we will increase Reglan to 10 mg IV every 6 hours. -Discontinue Zofran and placed on IV Compazine as needed. -Will downgrade diet to clear liquids.  3.  Abnormal 2D echo EF 40 to 45%, dilated cardiomyopathy -Patient noted to have minimally elevated troponins on admission, 2D echo was obtained which showed a EF of 40 to 45%, global hypokinesis, grade 2 diastolic dysfunction, mildly dilated left atrial size, trivial MVR, mildly dilated pulmonary artery. -Due to abnormal 2D echo cardiology consulted will assess patient and feel no indication for ischemic workup at this time with no acute EKG changes and troponin minimally elevated. -Cardiology recommended discontinuation of patient's Norvasc and Procardia. -Entresto started per cardiology and are recommending repeat 2D echo in 3 months to see if any improvement. -Patient noted to be on Coreg 25 mg twice daily prior to admission which has been continued.   -Patient with ongoing emesis today unable to keep anything down and as such we will discontinue Coreg and placed on IV Lopressor for now.   -Cardiology following and appreciate input and recommendations.    4.  ESRD on HHD [facility]F -Patient noted to have had full hemodialysis on 01/05/2023. -Patient seen in consultation by nephrology and patient underwent hemodialysis today, 01/07/2023.. -Per nephrology.  5.  Hypertension -Norvasc and Procardia discontinued per cardiology recommendations. -Patient on Coreg home dose, Entresto started per cardiology 01/06/2023.  -Patient noted not to have received antihypertensive medications prior to hemodialysis.   -Post hemodialysis patient actively vomiting unable  PROGRESS NOTE    [name]  QMV:[id number] DOB: 1976-01-22 DOA: 01/05/2023 PCP: Starla Link, MD    No chief complaint on file.   Brief Narrative:  Patient 47 year old gentleman history of ESRD on dialysis TTS, history of hypertension, diabetes mellitus type 2, gastroparesis, presented to the ED with 3-day history of fever of 101.2, nausea vomiting, productive cough of clear sputum and noted on chest x-ray and CT abdomen and pelvis to have a right middle lobe pneumonia.  COVID-19 PCR negative.  Patient admitted placed empirically on IV antibiotics.  2D echo obtained noted a EF of 40 to 45% with global hypokinesis.  Cardiology consulted.  Nephrology consulted.   Assessment & Plan:   Principal Problem:   CAP (community acquired pneumonia) Active Problems:   Nausea & vomiting   Elevated troponin   ESRD (end stage renal disease) (HCC)   Abnormal echocardiogram   Hypertension   Anemia of chronic disease   Type 2 diabetes mellitus without complication, without long-term current use of insulin (HCC)   Abnormal TSH   Gastroparesis   Dilated cardiomyopathy (HCC)  #1 right middle lobe CAP, POA -Patient presenting with a productive cough of clear sputum, fever, nausea and vomiting x 3 days. -Chest x-ray done concerning for right middle lobe pneumonia and patient noted to have 2 or 3 characteristics of fever, leukocytosis, purulent sputum. -SARS coronavirus 2 PCR negative. -Sputum cultures pending. -Blood cultures pending. -Urine Legionella antigen pending. -Urine pneumococcus antigen negative. -Continue azithromycin and IV Rocephin, Mucinex, PPI..  2.  Nausea vomiting/history of gastroparesis -Patient with nausea vomiting noted to be on Reglan prior to admission. -CT abdomen and pelvis done with cholelithiasis, moderate severe eating diet with urinary bladder wall thickening which may be secondary to chronic bladder outlet obstruction.  Aortic atherosclerosis.  Small right  pleural effusion.  Moderate to marked severity right middle lobe infiltrate. -Due to nausea and vomiting patient placed on Reglan 5 mg IV every 6 hours.  -As patient with ongoing emesis we will increase Reglan to 10 mg IV every 6 hours. -Discontinue Zofran and placed on IV Compazine as needed. -Will downgrade diet to clear liquids.  3.  Abnormal 2D echo EF 40 to 45%, dilated cardiomyopathy -Patient noted to have minimally elevated troponins on admission, 2D echo was obtained which showed a EF of 40 to 45%, global hypokinesis, grade 2 diastolic dysfunction, mildly dilated left atrial size, trivial MVR, mildly dilated pulmonary artery. -Due to abnormal 2D echo cardiology consulted will assess patient and feel no indication for ischemic workup at this time with no acute EKG changes and troponin minimally elevated. -Cardiology recommended discontinuation of patient's Norvasc and Procardia. -Entresto started per cardiology and are recommending repeat 2D echo in 3 months to see if any improvement. -Patient noted to be on Coreg 25 mg twice daily prior to admission which has been continued.   -Patient with ongoing emesis today unable to keep anything down and as such we will discontinue Coreg and placed on IV Lopressor for now.   -Cardiology following and appreciate input and recommendations.    4.  ESRD on HHD [facility] -Patient noted to have had full hemodialysis on 01/05/2023. -Patient seen in consultation by nephrology and patient underwent hemodialysis today, 01/07/2023.. -Per nephrology.  5.  Hypertension -Norvasc and Procardia discontinued per cardiology recommendations. -Patient on Coreg home dose, Entresto started per cardiology 01/06/2023.  -Patient noted not to have received antihypertensive medications prior to hemodialysis.   -Post hemodialysis patient actively vomiting unable  PROGRESS NOTE    [name]  QMV:[id number] DOB: 1976-01-22 DOA: 01/05/2023 PCP: Starla Link, MD    No chief complaint on file.   Brief Narrative:  Patient 47 year old gentleman history of ESRD on dialysis TTS, history of hypertension, diabetes mellitus type 2, gastroparesis, presented to the ED with 3-day history of fever of 101.2, nausea vomiting, productive cough of clear sputum and noted on chest x-ray and CT abdomen and pelvis to have a right middle lobe pneumonia.  COVID-19 PCR negative.  Patient admitted placed empirically on IV antibiotics.  2D echo obtained noted a EF of 40 to 45% with global hypokinesis.  Cardiology consulted.  Nephrology consulted.   Assessment & Plan:   Principal Problem:   CAP (community acquired pneumonia) Active Problems:   Nausea & vomiting   Elevated troponin   ESRD (end stage renal disease) (HCC)   Abnormal echocardiogram   Hypertension   Anemia of chronic disease   Type 2 diabetes mellitus without complication, without long-term current use of insulin (HCC)   Abnormal TSH   Gastroparesis   Dilated cardiomyopathy (HCC)  #1 right middle lobe CAP, POA -Patient presenting with a productive cough of clear sputum, fever, nausea and vomiting x 3 days. -Chest x-ray done concerning for right middle lobe pneumonia and patient noted to have 2 or 3 characteristics of fever, leukocytosis, purulent sputum. -SARS coronavirus 2 PCR negative. -Sputum cultures pending. -Blood cultures pending. -Urine Legionella antigen pending. -Urine pneumococcus antigen negative. -Continue azithromycin and IV Rocephin, Mucinex, PPI..  2.  Nausea vomiting/history of gastroparesis -Patient with nausea vomiting noted to be on Reglan prior to admission. -CT abdomen and pelvis done with cholelithiasis, moderate severe eating diet with urinary bladder wall thickening which may be secondary to chronic bladder outlet obstruction.  Aortic atherosclerosis.  Small right  pleural effusion.  Moderate to marked severity right middle lobe infiltrate. -Due to nausea and vomiting patient placed on Reglan 5 mg IV every 6 hours.  -As patient with ongoing emesis we will increase Reglan to 10 mg IV every 6 hours. -Discontinue Zofran and placed on IV Compazine as needed. -Will downgrade diet to clear liquids.  3.  Abnormal 2D echo EF 40 to 45%, dilated cardiomyopathy -Patient noted to have minimally elevated troponins on admission, 2D echo was obtained which showed a EF of 40 to 45%, global hypokinesis, grade 2 diastolic dysfunction, mildly dilated left atrial size, trivial MVR, mildly dilated pulmonary artery. -Due to abnormal 2D echo cardiology consulted will assess patient and feel no indication for ischemic workup at this time with no acute EKG changes and troponin minimally elevated. -Cardiology recommended discontinuation of patient's Norvasc and Procardia. -Entresto started per cardiology and are recommending repeat 2D echo in 3 months to see if any improvement. -Patient noted to be on Coreg 25 mg twice daily prior to admission which has been continued.   -Patient with ongoing emesis today unable to keep anything down and as such we will discontinue Coreg and placed on IV Lopressor for now.   -Cardiology following and appreciate input and recommendations.    4.  ESRD on HHD [facility] -Patient noted to have had full hemodialysis on 01/05/2023. -Patient seen in consultation by nephrology and patient underwent hemodialysis today, 01/07/2023.. -Per nephrology.  5.  Hypertension -Norvasc and Procardia discontinued per cardiology recommendations. -Patient on Coreg home dose, Entresto started per cardiology 01/06/2023.  -Patient noted not to have received antihypertensive medications prior to hemodialysis.   -Post hemodialysis patient actively vomiting unable  PROGRESS NOTE    [name]  QMV:[id number] DOB: 1976-01-22 DOA: 01/05/2023 PCP: Starla Link, MD    No chief complaint on file.   Brief Narrative:  Patient 47 year old gentleman history of ESRD on dialysis TTS, history of hypertension, diabetes mellitus type 2, gastroparesis, presented to the ED with 3-day history of fever of 101.2, nausea vomiting, productive cough of clear sputum and noted on chest x-ray and CT abdomen and pelvis to have a right middle lobe pneumonia.  COVID-19 PCR negative.  Patient admitted placed empirically on IV antibiotics.  2D echo obtained noted a EF of 40 to 45% with global hypokinesis.  Cardiology consulted.  Nephrology consulted.   Assessment & Plan:   Principal Problem:   CAP (community acquired pneumonia) Active Problems:   Nausea & vomiting   Elevated troponin   ESRD (end stage renal disease) (HCC)   Abnormal echocardiogram   Hypertension   Anemia of chronic disease   Type 2 diabetes mellitus without complication, without long-term current use of insulin (HCC)   Abnormal TSH   Gastroparesis   Dilated cardiomyopathy (HCC)  #1 right middle lobe CAP, POA -Patient presenting with a productive cough of clear sputum, fever, nausea and vomiting x 3 days. -Chest x-ray done concerning for right middle lobe pneumonia and patient noted to have 2 or 3 characteristics of fever, leukocytosis, purulent sputum. -SARS coronavirus 2 PCR negative. -Sputum cultures pending. -Blood cultures pending. -Urine Legionella antigen pending. -Urine pneumococcus antigen negative. -Continue azithromycin and IV Rocephin, Mucinex, PPI..  2.  Nausea vomiting/history of gastroparesis -Patient with nausea vomiting noted to be on Reglan prior to admission. -CT abdomen and pelvis done with cholelithiasis, moderate severe eating diet with urinary bladder wall thickening which may be secondary to chronic bladder outlet obstruction.  Aortic atherosclerosis.  Small right  pleural effusion.  Moderate to marked severity right middle lobe infiltrate. -Due to nausea and vomiting patient placed on Reglan 5 mg IV every 6 hours.  -As patient with ongoing emesis we will increase Reglan to 10 mg IV every 6 hours. -Discontinue Zofran and placed on IV Compazine as needed. -Will downgrade diet to clear liquids.  3.  Abnormal 2D echo EF 40 to 45%, dilated cardiomyopathy -Patient noted to have minimally elevated troponins on admission, 2D echo was obtained which showed a EF of 40 to 45%, global hypokinesis, grade 2 diastolic dysfunction, mildly dilated left atrial size, trivial MVR, mildly dilated pulmonary artery. -Due to abnormal 2D echo cardiology consulted will assess patient and feel no indication for ischemic workup at this time with no acute EKG changes and troponin minimally elevated. -Cardiology recommended discontinuation of patient's Norvasc and Procardia. -Entresto started per cardiology and are recommending repeat 2D echo in 3 months to see if any improvement. -Patient noted to be on Coreg 25 mg twice daily prior to admission which has been continued.   -Patient with ongoing emesis today unable to keep anything down and as such we will discontinue Coreg and placed on IV Lopressor for now.   -Cardiology following and appreciate input and recommendations.    4.  ESRD on HHD [facility] -Patient noted to have had full hemodialysis on 01/05/2023. -Patient seen in consultation by nephrology and patient underwent hemodialysis today, 01/07/2023.. -Per nephrology.  5.  Hypertension -Norvasc and Procardia discontinued per cardiology recommendations. -Patient on Coreg home dose, Entresto started per cardiology 01/06/2023.  -Patient noted not to have received antihypertensive medications prior to hemodialysis.   -Post hemodialysis patient actively vomiting unable  CULTURE Performed at [facility], [address], [place], [place] [address]    Report Status 01/06/2023 FINAL  Final  Culture, Respiratory w Gram Stain     Status: None (Preliminary result)   Collection Time: 01/06/23 12:50 AM  Result Value Ref Range Status   Specimen Description EXPECTORATED SPUTUM  Final   Special Requests NONE Reflexed from [id number]  Final   Gram Stain   Final    ABUNDANT WBC PRESENT,BOTH PMN AND MONONUCLEAR FEW GRAM VARIABLE ROD FEW GRAM POSITIVE COCCI IN PAIRS IN SINGLES Performed at [facility], [address] [place], [place] [address]    Culture PENDING  Incomplete   Report Status PENDING  Incomplete         Radiology Studies: ECHOCARDIOGRAM COMPLETE  Result Date: 01/06/2023    ECHOCARDIOGRAM REPORT   Patient Name:   DARRENCE SIEGMAN [facility] Date of Exam: 01/06/2023 Medical Rec #:  [id number]        Height:       69.0 in Accession #:    [id number]       Weight:       207.2 lb Date of Birth:  12-Jul-1975        BSA:          2.097 m Patient Age:    47 years         BP:           151/84 mmHg Patient Gender: M                HR:           89 bpm. Exam Location:  Inpatient Procedure: 2D Echo, Cardiac Doppler and Color Doppler Indications:    Abnormal ECG R94.31  History:        Patient has no prior history of Echocardiogram examinations.                 Risk Factors:Hypertension.  Sonographer:    Harriette Bouillon RDCS Referring Phys: [id number] ANASTASSIA DOUTOVA IMPRESSIONS  1. Left ventricular ejection fraction, by estimation, is 40 to 45%. The left ventricle has mildly decreased function. The left ventricle demonstrates global hypokinesis. There is moderate left ventricular hypertrophy. Left ventricular diastolic parameters are consistent with Grade II diastolic dysfunction (pseudonormalization).  2. Right ventricular systolic function is normal. The right ventricular size  is mildly enlarged. Tricuspid regurgitation signal is inadequate for assessing PA pressure.  3. Left atrial size was mildly dilated.  4. The mitral valve is normal in structure. Trivial mitral valve regurgitation. No evidence of mitral stenosis.  5. The aortic valve is tricuspid. Aortic valve regurgitation is not visualized. No aortic stenosis is present.  6. Mildly dilated pulmonary artery.  7. The inferior vena cava is dilated in size with <50% respiratory variability, suggesting right atrial pressure of 15 mmHg. FINDINGS  Left Ventricle: Left ventricular ejection fraction, by estimation, is 40 to 45%. The left ventricle has mildly decreased function. The left ventricle demonstrates global hypokinesis. The left ventricular internal cavity size was normal in size. There is  moderate left ventricular hypertrophy. Left ventricular diastolic parameters are consistent with Grade II diastolic dysfunction (pseudonormalization). Right Ventricle: The right ventricular size is mildly enlarged. No increase in right ventricular wall thickness. Right ventricular systolic function is normal. Tricuspid regurgitation signal is inadequate for assessing PA pressure. Left Atrium: Left atrial size was mildly dilated. Right Atrium: Right atrial size was normal in size. Pericardium: Trivial pericardial effusion is present. Mitral  CULTURE Performed at [facility], [address], [place], [place] [address]    Report Status 01/06/2023 FINAL  Final  Culture, Respiratory w Gram Stain     Status: None (Preliminary result)   Collection Time: 01/06/23 12:50 AM  Result Value Ref Range Status   Specimen Description EXPECTORATED SPUTUM  Final   Special Requests NONE Reflexed from [id number]  Final   Gram Stain   Final    ABUNDANT WBC PRESENT,BOTH PMN AND MONONUCLEAR FEW GRAM VARIABLE ROD FEW GRAM POSITIVE COCCI IN PAIRS IN SINGLES Performed at [facility], [address] [place], [place] [address]    Culture PENDING  Incomplete   Report Status PENDING  Incomplete         Radiology Studies: ECHOCARDIOGRAM COMPLETE  Result Date: 01/06/2023    ECHOCARDIOGRAM REPORT   Patient Name:   DARRENCE SIEGMAN [facility] Date of Exam: 01/06/2023 Medical Rec #:  [id number]        Height:       69.0 in Accession #:    [id number]       Weight:       207.2 lb Date of Birth:  12-Jul-1975        BSA:          2.097 m Patient Age:    47 years         BP:           151/84 mmHg Patient Gender: M                HR:           89 bpm. Exam Location:  Inpatient Procedure: 2D Echo, Cardiac Doppler and Color Doppler Indications:    Abnormal ECG R94.31  History:        Patient has no prior history of Echocardiogram examinations.                 Risk Factors:Hypertension.  Sonographer:    Harriette Bouillon RDCS Referring Phys: [id number] ANASTASSIA DOUTOVA IMPRESSIONS  1. Left ventricular ejection fraction, by estimation, is 40 to 45%. The left ventricle has mildly decreased function. The left ventricle demonstrates global hypokinesis. There is moderate left ventricular hypertrophy. Left ventricular diastolic parameters are consistent with Grade II diastolic dysfunction (pseudonormalization).  2. Right ventricular systolic function is normal. The right ventricular size  is mildly enlarged. Tricuspid regurgitation signal is inadequate for assessing PA pressure.  3. Left atrial size was mildly dilated.  4. The mitral valve is normal in structure. Trivial mitral valve regurgitation. No evidence of mitral stenosis.  5. The aortic valve is tricuspid. Aortic valve regurgitation is not visualized. No aortic stenosis is present.  6. Mildly dilated pulmonary artery.  7. The inferior vena cava is dilated in size with <50% respiratory variability, suggesting right atrial pressure of 15 mmHg. FINDINGS  Left Ventricle: Left ventricular ejection fraction, by estimation, is 40 to 45%. The left ventricle has mildly decreased function. The left ventricle demonstrates global hypokinesis. The left ventricular internal cavity size was normal in size. There is  moderate left ventricular hypertrophy. Left ventricular diastolic parameters are consistent with Grade II diastolic dysfunction (pseudonormalization). Right Ventricle: The right ventricular size is mildly enlarged. No increase in right ventricular wall thickness. Right ventricular systolic function is normal. Tricuspid regurgitation signal is inadequate for assessing PA pressure. Left Atrium: Left atrial size was mildly dilated. Right Atrium: Right atrial size was normal in size. Pericardium: Trivial pericardial effusion is present. Mitral

## 2023-01-07 NOTE — Procedures (Signed)
I saw the patient and agree with the above assessment and plan.     TTE with moderate HFrEF, CV adjusting meds.    K 3.9 on 3 K bath.  Using AVF.  Hb 9.7 this AM.   On RA no sig dyspnea.   Having nausea not iproving with zofran.  Give low dose phenergan x1.

## 2023-01-07 NOTE — Plan of Care (Signed)
  Problem: Nutrition: Goal: Adequate nutrition will be maintained Outcome: Progressing   Problem: Coping: Goal: Level of anxiety will decrease Outcome: Progressing   Problem: Elimination: Goal: Will not experience complications related to bowel motility Outcome: Progressing   Problem: Safety: Goal: Ability to remain free from injury will improve Outcome: Progressing   Problem: Skin Integrity: Goal: Risk for impaired skin integrity will decrease Outcome: Progressing

## 2023-01-07 NOTE — Progress Notes (Signed)
Rounding Note    Patient Name: [name] Date of Encounter: 01/07/2023  [facility] Cardiologist: None   Subjective   On HD. Feels cold, but no shaking chills. Coughing, but breathing better. Nausea better.  Inpatient Medications    Scheduled Meds:  (feeding supplement) PROSource Plus  30 mL Oral BID BM   atorvastatin  40 mg Oral Daily   azithromycin  500 mg Oral Daily   calcitRIOL  0.5 mcg Oral Daily   carvedilol  25 mg Oral BID WC   Chlorhexidine Gluconate Cloth  6 each Topical Q0600   darbepoetin (ARANESP) injection - DIALYSIS  60 mcg Subcutaneous Q Fri-1800   feeding supplement  237 mL Oral TID BM   guaiFENesin  1,200 mg Oral BID   insulin aspart  0-9 Units Subcutaneous Q4H   metoCLOPramide (REGLAN) injection  5 mg Intravenous Q6H   pantoprazole  40 mg Oral BID AC   sacubitril-valsartan  1 tablet Oral BID   sodium chloride flush  3 mL Intravenous Q12H   Continuous Infusions:  sodium chloride     cefTRIAXone (ROCEPHIN)  IV Stopped (01/06/23 2103)   PRN Meds: sodium chloride, acetaminophen **OR** acetaminophen, albuterol, HYDROcodone-acetaminophen, ondansetron **OR** ondansetron (ZOFRAN) IV, sodium chloride flush   Vital Signs    Vitals:   01/07/23 1121 01/07/23 1130 01/07/23 1200 01/07/23 1230  BP: (!) 191/100 (!) 178/96 (!) 173/100 (!) 181/98  Pulse: 81 79 80 81  Resp: 15 16 15 14   Temp:      TempSrc:      SpO2: 98% 98% 93% 99%  Weight:      Height:        Intake/Output Summary (Last 24 hours) at 01/07/2023 1245 Last data filed at 01/07/2023 0511 Gross per 24 hour  Intake 100 ml  Output --  Net 100 ml      01/07/2023    8:45 AM 01/05/2023   11:30 PM 12/22/2022   10:34 AM  Last 3 Weights  Weight (lbs) 205 lb 14.6 oz 207 lb 3.7 oz 202 lb 13.2 oz  Weight (kg) 93.4 kg 94 kg 92 kg      Telemetry    NSR - Personally Reviewed  ECG    NSR w one PVC, prolonged QT - Personally Reviewed  Physical Exam  On HD GEN: No acute  distress.   Neck: No JVD Cardiac: RRR, no murmurs, rubs, or gallops.  Respiratory: Clear to auscultation bilaterally. GI: Soft, nontender, non-distended  MS: No edema; No deformity. Neuro:  Nonfocal  Psych: Normal affect   Labs    High Sensitivity Troponin:   Recent Labs  Lab 01/05/23 1717 01/05/23 1903 01/06/23 0653 01/06/23 0657  TROPONINIHS 44* 59* 50* 49*     Chemistry Recent Labs  Lab 01/05/23 1717 01/06/23 0657 01/07/23 0712  NA 134* 133* 135  K 3.9 3.8 3.9  CL 97* 97* 100  CO2 21* 24 21*  GLUCOSE 161* 140* 124*  BUN 29* 38* 51*  CREATININE 8.51* 9.77* 11.59*  CALCIUM 9.0 8.5* 8.2*  MG  --  2.1  --   PROT 7.4 6.3*  --   ALBUMIN 3.9 3.2* 2.9*  AST 15 12*  --   ALT 16 14  --   ALKPHOS 47 33*  --   BILITOT 0.8 0.4  --   GFRNONAA 7* 6* 5*  ANIONGAP 16* 12 14    Lipids No results for input(s): "CHOL", "TRIG", "HDL", "LABVLDL", "LDLCALC", "CHOLHDL" in the last  168 hours.  Hematology Recent Labs  Lab 01/05/23 1717 01/06/23 0653 01/06/23 0657 01/07/23 0712  WBC 5.6  --  5.2 6.8  RBC 3.52* 3.17* 3.21* 3.40*  HGB 10.1*  --  9.4* 9.7*  HCT 32.7*  --  29.7* 30.9*  MCV 92.9  --  92.5 90.9  MCH 28.7  --  29.3 28.5  MCHC 30.9  --  31.6 31.4  RDW 14.8  --  14.6 14.6  PLT 125*  --  106* 122*   Thyroid  Recent Labs  Lab 01/06/23 0653 01/07/23 0712  TSH 16.771*  --   FREET4  --  0.69    BNPNo results for input(s): "BNP", "PROBNP" in the last 168 hours.  DDimer No results for input(s): "DDIMER" in the last 168 hours.   Radiology     Cardiac Studies   ECHO 01/06/2023   1. Left ventricular ejection fraction, by estimation, is 40 to 45%. The left ventricle has mildly decreased function. The left ventricle demonstrates global hypokinesis. There is moderate left ventricular hypertrophy. Left ventricular diastolic parameters are consistent with Grade II diastolic dysfunction (pseudonormalization).   2. Right ventricular systolic function is normal. The  right ventricular size is mildly enlarged. Tricuspid regurgitation signal is inadequate for assessing PA pressure.   3. Left atrial size was mildly dilated.   4. The mitral valve is normal in structure. Trivial mitral valve regurgitation. No evidence of mitral stenosis.   5. The aortic valve is tricuspid. Aortic valve regurgitation is not visualized. No aortic stenosis is present.   6. Mildly dilated pulmonary artery.   7. The inferior vena cava is dilated in size with <50% respiratory variability, suggesting right atrial pressure of 15 mmHg.  Patient Profile     47 y.o. male w ESRD, HTN, DM presenting w RML pneumonia and newly diagnosed decreased LVEF  Assessment & Plan    Remains HTNive despite hi dose carvedilol and Entresto. No overt HF exacerbation. May need to add back a calcium channel blocker after all.     For questions or updates, please contact [facility] [place] [facility] Please consult [email or web address] for contact info under        Signed, Thurmon Fair, MD  01/07/2023, 12:45 PM

## 2023-01-08 ENCOUNTER — Inpatient Hospital Stay (HOSPITAL_COMMUNITY): Payer: PRIVATE HEALTH INSURANCE

## 2023-01-08 DIAGNOSIS — I420 Dilated cardiomyopathy: Secondary | ICD-10-CM | POA: Diagnosis not present

## 2023-01-08 DIAGNOSIS — E119 Type 2 diabetes mellitus without complications: Secondary | ICD-10-CM | POA: Diagnosis not present

## 2023-01-08 DIAGNOSIS — J189 Pneumonia, unspecified organism: Secondary | ICD-10-CM | POA: Diagnosis not present

## 2023-01-08 DIAGNOSIS — R931 Abnormal findings on diagnostic imaging of heart and coronary circulation: Secondary | ICD-10-CM | POA: Diagnosis not present

## 2023-01-08 DIAGNOSIS — R7989 Other specified abnormal findings of blood chemistry: Secondary | ICD-10-CM | POA: Diagnosis not present

## 2023-01-08 LAB — RENAL FUNCTION PANEL
Albumin: 2.9 g/dL — ABNORMAL LOW (ref 3.5–5.0)
Anion gap: 13 (ref 5–15)
BUN: 32 mg/dL — ABNORMAL HIGH (ref 6–20)
CO2: 23 mmol/L (ref 22–32)
Calcium: 7.9 mg/dL — ABNORMAL LOW (ref 8.9–10.3)
Chloride: 99 mmol/L (ref 98–111)
Creatinine, Ser: 8.66 mg/dL — ABNORMAL HIGH (ref 0.61–1.24)
GFR, Estimated: 7 mL/min — ABNORMAL LOW
Glucose, Bld: 102 mg/dL — ABNORMAL HIGH (ref 70–99)
Phosphorus: 4.6 mg/dL (ref 2.5–4.6)
Potassium: 3.6 mmol/L (ref 3.5–5.1)
Sodium: 135 mmol/L (ref 135–145)

## 2023-01-08 LAB — CBC WITH DIFFERENTIAL/PLATELET
Abs Immature Granulocytes: 0.01 K/uL (ref 0.00–0.07)
Basophils Absolute: 0 K/uL (ref 0.0–0.1)
Basophils Relative: 0 %
Eosinophils Absolute: 0.2 K/uL (ref 0.0–0.5)
Eosinophils Relative: 3 %
HCT: 36.4 % — ABNORMAL LOW (ref 39.0–52.0)
Hemoglobin: 11.4 g/dL — ABNORMAL LOW (ref 13.0–17.0)
Immature Granulocytes: 0 %
Lymphocytes Relative: 14 %
Lymphs Abs: 0.7 K/uL (ref 0.7–4.0)
MCH: 28.4 pg (ref 26.0–34.0)
MCHC: 31.3 g/dL (ref 30.0–36.0)
MCV: 90.5 fL (ref 80.0–100.0)
Monocytes Absolute: 0.9 K/uL (ref 0.1–1.0)
Monocytes Relative: 18 %
Neutro Abs: 3.2 K/uL (ref 1.7–7.7)
Neutrophils Relative %: 65 %
Platelets: 134 K/uL — ABNORMAL LOW (ref 150–400)
RBC: 4.02 MIL/uL — ABNORMAL LOW (ref 4.22–5.81)
RDW: 14.3 % (ref 11.5–15.5)
WBC: 5 K/uL (ref 4.0–10.5)
nRBC: 0 % (ref 0.0–0.2)

## 2023-01-08 LAB — GLUCOSE, CAPILLARY
Glucose-Capillary: 110 mg/dL — ABNORMAL HIGH (ref 70–99)
Glucose-Capillary: 126 mg/dL — ABNORMAL HIGH (ref 70–99)
Glucose-Capillary: 105 mg/dL — ABNORMAL HIGH (ref 70–99)
Glucose-Capillary: 164 mg/dL — ABNORMAL HIGH (ref 70–99)
Glucose-Capillary: 92 mg/dL (ref 70–99)
Glucose-Capillary: 102 mg/dL — ABNORMAL HIGH (ref 70–99)

## 2023-01-08 LAB — LIPID PANEL
Cholesterol: 92 mg/dL (ref 0–200)
HDL: 23 mg/dL — ABNORMAL LOW
LDL Cholesterol: 46 mg/dL (ref 0–99)
Total CHOL/HDL Ratio: 4 ratio
Triglycerides: 116 mg/dL
VLDL: 23 mg/dL (ref 0–40)

## 2023-01-08 LAB — LEGIONELLA PNEUMOPHILA SEROGP 1 UR AG: L. pneumophila Serogp 1 Ur Ag: NEGATIVE

## 2023-01-08 MED ORDER — LIDOCAINE HCL (PF) 1 % IJ SOLN: 5.0000 mL | INTRAMUSCULAR | Status: DC | PRN

## 2023-01-08 MED ORDER — ALTEPLASE 2 MG IJ SOLR: 2.0000 mg | Freq: Once | INTRAMUSCULAR | Status: DC | PRN

## 2023-01-08 MED ORDER — METOPROLOL TARTRATE 5 MG/5ML IV SOLN
7.5000 mg | Freq: Three times a day (TID) | INTRAVENOUS | Status: DC
  Administered 2023-01-08 – 2023-01-11 (×9): 7.5 mg via INTRAVENOUS
  Filled 2023-01-08 (×9): qty 10

## 2023-01-08 MED ORDER — PENTAFLUOROPROP-TETRAFLUOROETH EX AERO: 1.0000 | INHALATION_SPRAY | CUTANEOUS | Status: DC | PRN

## 2023-01-08 MED ORDER — PROCHLORPERAZINE EDISYLATE 10 MG/2ML IJ SOLN
10.0000 mg | Freq: Four times a day (QID) | INTRAMUSCULAR | Status: DC | PRN
  Administered 2023-01-08 – 2023-01-10 (×4): 10 mg via INTRAVENOUS
  Filled 2023-01-08 (×4): qty 2

## 2023-01-08 MED ORDER — ALUM & MAG HYDROXIDE-SIMETH 200-200-20 MG/5ML PO SUSP
30.0000 mL | Freq: Four times a day (QID) | ORAL | Status: DC | PRN
  Administered 2023-01-08 – 2023-01-11 (×3): 30 mL via ORAL
  Filled 2023-01-08 (×3): qty 30

## 2023-01-08 MED ORDER — HEPARIN SODIUM (PORCINE) 1000 UNIT/ML DIALYSIS
5000.0000 [IU] | Freq: Once | INTRAMUSCULAR | Status: DC
  Filled 2023-01-08: qty 5

## 2023-01-08 MED ORDER — HEPARIN SODIUM (PORCINE) 1000 UNIT/ML DIALYSIS
1000.0000 [IU] | INTRAMUSCULAR | Status: DC | PRN
  Administered 2023-01-08: 1000 [IU]
  Filled 2023-01-08: qty 1

## 2023-01-08 MED ORDER — SODIUM CHLORIDE 0.9 % IV SOLN
12.5000 mg | Freq: Once | INTRAVENOUS | Status: AC
  Administered 2023-01-08: 12.5 mg via INTRAVENOUS
  Filled 2023-01-08: qty 12.5
  Filled 2023-01-08: qty 0.5

## 2023-01-08 MED ORDER — SCOPOLAMINE 1 MG/3DAYS TD PT72
1.0000 | MEDICATED_PATCH | TRANSDERMAL | Status: DC
  Administered 2023-01-08 – 2023-01-11 (×2): 1.5 mg via TRANSDERMAL
  Filled 2023-01-08 (×2): qty 1

## 2023-01-08 MED ORDER — ANTICOAGULANT SODIUM CITRATE 4% (200MG/5ML) IV SOLN: 5.0000 mL | Status: DC | PRN

## 2023-01-08 MED ORDER — LIDOCAINE-PRILOCAINE 2.5-2.5 % EX CREA: 1.0000 | TOPICAL_CREAM | CUTANEOUS | Status: DC | PRN

## 2023-01-08 NOTE — Progress Notes (Signed)
PROGRESS NOTE    [name]  ZOX:[id number] DOB: 06-07-1975 DOA: 01/05/2023 PCP: Starla Link, MD    No chief complaint on file.   Brief Narrative:  Patient 47 year old gentleman history of ESRD on dialysis TTS, history of hypertension, diabetes mellitus type 2, gastroparesis, presented to the ED with 3-day history of fever of 101.2, nausea vomiting, productive cough of clear sputum and noted on chest x-ray and CT abdomen and pelvis to have a right middle lobe pneumonia.  COVID-19 PCR negative.  Patient admitted placed empirically on IV antibiotics.  2D echo obtained noted a EF of 40 to 45% with global hypokinesis.  Cardiology consulted.  Nephrology consulted.   Assessment & Plan:   Principal Problem:   CAP (community acquired pneumonia) Active Problems:   Nausea & vomiting   Elevated troponin   ESRD (end stage renal disease) (HCC)   Abnormal echocardiogram   Hypertension   Anemia of chronic disease   Type 2 diabetes mellitus without complication, without long-term current use of insulin (HCC)   Abnormal TSH   Gastroparesis   Dilated cardiomyopathy (HCC)  #1 right middle lobe CAP, POA -Patient presenting with a productive cough of clear sputum, fever, nausea and vomiting x 3 days. -Chest x-ray done concerning for right middle lobe pneumonia and patient noted to have 2 or 3 characteristics of fever, leukocytosis, purulent sputum. -SARS coronavirus 2 PCR negative. -Sputum cultures pending. -Blood cultures pending. -Urine Legionella antigen negative. -Urine pneumococcus antigen negative. -Continue IV Rocephin, Mucinex, PPI. -Discontinue azithromycin.  2.  Nausea vomiting/history of gastroparesis -Patient with nausea vomiting noted to be on Reglan prior to admission. -CT abdomen and pelvis done with cholelithiasis, moderate severe eating diet with urinary bladder wall thickening which may be secondary to chronic bladder outlet obstruction.  Aortic atherosclerosis.   Small right pleural effusion.  Moderate to marked severity right middle lobe infiltrate. -Due to nausea and vomiting patient placed on Reglan 5 mg IV every 6 hours initially on presentation and dose increased to 10 mg IV every 6 hours due to ongoing nausea and emesis. -Patient also with complaints of dizziness with nausea and emesis whenever he turns his head and as such we will get a PT vestibular evaluation, check head CT, place a scopolamine patch. -Continue IV Compazine as needed. -Continue clear liquids..   3.  Abnormal 2D echo EF 40 to 45%, dilated cardiomyopathy -Patient noted to have minimally elevated troponins on admission, 2D echo was obtained which showed a EF of 40 to 45%, global hypokinesis, grade 2 diastolic dysfunction, mildly dilated left atrial size, trivial MVR, mildly dilated pulmonary artery. -Due to abnormal 2D echo cardiology consulted will assess patient and feel no indication for ischemic workup at this time with no acute EKG changes and troponin minimally elevated. -Cardiology recommended discontinuation of patient's Norvasc and Procardia. -Entresto started per cardiology and are recommending repeat 2D echo in 3 months to see if any improvement. -Patient noted to be on Coreg 25 mg twice daily prior to admission which was initially continued however due to ongoing emesis Coreg discontinued and patient placed on IV Lopressor. -Cardiology following and appreciate input and recommendations.    4.  ESRD on HHD MTuThF -Patient noted to have had full hemodialysis on 01/05/2023. -Patient seen in consultation by nephrology and patient underwent hemodialysis, 01/07/2023.. -Per nephrology.  5.  Hypertension -Norvasc and Procardia discontinued per cardiology recommendations. -Patient on Coreg home dose, Entresto started per cardiology 01/06/2023.  -Patient noted not to have received antihypertensive  medications prior to hemodialysis.   -Post hemodialysis on 01/07/2023, patient  actively vomiting unable to keep pills down and as such Coreg was discontinued and patient placed on IV Lopressor.   -Continue Entresto when able to take oral intake.   6.  Anemia of ESRD -Hemoglobin currently at 11.4. -Per nephrology.  7.  Well-controlled diabetes mellitus type 2 -Hemoglobin A1c 5.3.   -CBG 105 this morning.   -SSI.  8.  Abnormal TSH -TSH noted at 16.771. -Free T4 within normal limits at 0.69. -Outpatient follow-up with repeat labs done in 4 to 6 weeks.      DVT prophylaxis: SCDs Code Status: Full Family Communication: Updated patient.  Updated wife at bedside. Disposition: Home when clinically improved.  Status is: Inpatient Remains inpatient appropriate because: Severity of illness   Consultants:  Nephrology: Dr. [name] 01/06/2023 Cardiology: Dr. Eden Emms 01/06/2023  Procedures:  CT abdomen pelvis 01/05/2023 Chest x-ray 01/05/2023 2D echo 01/06/2023  Antimicrobials:  Anti-infectives (From admission, onward)    Start     Dose/Rate Route Frequency Ordered Stop   01/06/23 2200  azithromycin (ZITHROMAX) tablet 500 mg        500 mg Oral Daily 01/06/23 1126     01/06/23 2000  cefTRIAXone (ROCEPHIN) 2 g in sodium chloride 0.9 % 100 mL IVPB        2 g 200 mL/hr over 30 Minutes Intravenous Every 24 hours 01/06/23 0903     01/05/23 2015  azithromycin (ZITHROMAX) 500 mg in sodium chloride 0.9 % 250 mL IVPB  Status:  Discontinued        500 mg 250 mL/hr over 60 Minutes Intravenous Every 24 hours 01/05/23 2014 01/06/23 1126   01/05/23 2015  cefTRIAXone (ROCEPHIN) 1 g in sodium chloride 0.9 % 100 mL IVPB  Status:  Discontinued        1 g 200 mL/hr over 30 Minutes Intravenous Every 24 hours 01/05/23 2014 01/06/23 0903         Subjective: Patient sitting up in bed holding emesis bag.  Some intermittent vomiting.  States he feels emesis is a little bit better than it was yesterday.  Still with complaints of nausea and emesis when he turns his head with some  associated dizziness.  Unable to state whether a spinning sensation.  Asking for something for motion sickness.  Able to tolerate clear liquids.  Wife at bedside.    Objective: Vitals:   01/08/23 0400 01/08/23 0442 01/08/23 0550 01/08/23 0814  BP:  (!) 153/91 (!) 167/95 (!) 167/94  Pulse:  84 80 84  Resp:  19 17 18   Temp:  98.9 F (37.2 C)    TempSrc:      SpO2:  98% 100% 100%  Weight: 92 kg     Height:       No intake or output data in the 24 hours ending 01/08/23 1436  Filed Weights   01/07/23 0845 01/07/23 1432 01/08/23 0400  Weight: 93.4 kg 91 kg 92 kg    Examination:  General exam: NAD. Respiratory system: Lungs clear to auscultation bilaterally.  No wheezes, no crackles, no rhonchi.  Fair air movement.  Speaking in full sentences.  Cardiovascular system: RRR no murmurs rubs or gallops.  No JVD.  No pitting lower extremity edema.  Gastrointestinal system: Abdomen is soft, nontender, nondistended, positive bowel sounds.  No rebound.  No guarding.  Central nervous system: Alert and oriented. No focal neurological deficits. Extremities: Symmetric 5 x 5 power. Skin: No rashes, lesions  or ulcers Psychiatry: Judgement and insight appear normal. Mood & affect appropriate.     Data Reviewed: I have personally reviewed following labs and imaging studies  CBC: Recent Labs  Lab 01/05/23 1717 01/06/23 0657 01/07/23 0712 01/08/23 0735  WBC 5.6 5.2 6.8 5.0  NEUTROABS 4.6  --  5.2 3.2  HGB 10.1* 9.4* 9.7* 11.4*  HCT 32.7* 29.7* 30.9* 36.4*  MCV 92.9 92.5 90.9 90.5  PLT 125* 106* 122* 134*    Basic Metabolic Panel: Recent Labs  Lab 01/05/23 1717 01/06/23 0657 01/07/23 0712 01/08/23 0735  NA 134* 133* 135 135  K 3.9 3.8 3.9 3.6  CL 97* 97* 100 99  CO2 21* 24 21* 23  GLUCOSE 161* 140* 124* 102*  BUN 29* 38* 51* 32*  CREATININE 8.51* 9.77* 11.59* 8.66*  CALCIUM 9.0 8.5* 8.2* 7.9*  MG  --  2.1  --   --   PHOS  --  5.3* 6.0* 4.6    GFR: Estimated Creatinine  Clearance: 11.9 mL/min (A) (by C-G formula based on SCr of 8.66 mg/dL (H)).  Liver Function Tests: Recent Labs  Lab 01/05/23 1717 01/06/23 0657 01/07/23 0712 01/08/23 0735  AST 15 12*  --   --   ALT 16 14  --   --   ALKPHOS 47 33*  --   --   BILITOT 0.8 0.4  --   --   PROT 7.4 6.3*  --   --   ALBUMIN 3.9 3.2* 2.9* 2.9*    CBG: Recent Labs  Lab 01/07/23 1935 01/08/23 0029 01/08/23 0356 01/08/23 0815 01/08/23 1247  GLUCAP 125* 110* 126* 105* 164*     Recent Results (from the past 240 hour(s))  Culture, blood (Routine x 2)     Status: None (Preliminary result)   Collection Time: 01/05/23  5:18 PM   Specimen: BLOOD  Result Value Ref Range Status   Specimen Description BLOOD SITE NOT SPECIFIED  Final   Special Requests   Final    BOTTLES DRAWN AEROBIC AND ANAEROBIC Blood Culture results may not be optimal due to an inadequate volume of blood received in culture bottles   Culture   Final    NO GROWTH 3 DAYS Performed at [facility], [address] [place], [place] [address]    Report Status PENDING  Incomplete  Culture, blood (Routine x 2)     Status: None (Preliminary result)   Collection Time: 01/05/23  7:03 PM   Specimen: BLOOD  Result Value Ref Range Status   Specimen Description BLOOD SITE NOT SPECIFIED  Final   Special Requests   Final    BOTTLES DRAWN AEROBIC AND ANAEROBIC Blood Culture adequate volume   Culture   Final    NO GROWTH 3 DAYS Performed at [facility], [address], [place], [place] [address]    Report Status PENDING  Incomplete  SARS Coronavirus 2 by RT PCR (hospital order, performed in [facility] hospital lab) *cepheid single result test* Anterior Nasal Swab     Status: None   Collection Time: 01/05/23  8:01 PM   Specimen: Anterior Nasal Swab  Result Value Ref Range Status   SARS Coronavirus 2 by RT PCR NEGATIVE NEGATIVE Final    Comment: Performed at [facility], [address], [place], [place] [address]   Expectorated Sputum Assessment w Gram Stain, Rflx to Resp Cult     Status: None   Collection Time: 01/06/23 12:50 AM  Specimen: Expectorated Sputum  Result Value Ref Range Status   Specimen Description EXPECTORATED SPUTUM  Final   Special Requests NONE  Final   Sputum evaluation   Final    THIS SPECIMEN IS ACCEPTABLE FOR SPUTUM CULTURE Performed at [facility], [address], [place], [place] [address]    Report Status 01/06/2023 FINAL  Final  Culture, Respiratory w Gram Stain     Status: None (Preliminary result)   Collection Time: 01/06/23 12:50 AM  Result Value Ref Range Status   Specimen Description EXPECTORATED SPUTUM  Final   Special Requests NONE Reflexed from [id number]  Final   Gram Stain   Final    ABUNDANT WBC PRESENT,BOTH PMN AND MONONUCLEAR FEW GRAM VARIABLE ROD FEW GRAM POSITIVE COCCI IN PAIRS IN SINGLES    Culture   Final    CULTURE REINCUBATED FOR BETTER GROWTH Performed at [facility], [address] [place], [place] [address]    Report Status PENDING  Incomplete         Radiology Studies: No results found.      Scheduled Meds:  (feeding supplement) PROSource Plus  30 mL Oral BID BM   azithromycin  500 mg Oral Daily   calcitRIOL  0.5 mcg Oral Daily   Chlorhexidine Gluconate Cloth  6 each Topical Q0600   darbepoetin (ARANESP) injection - DIALYSIS  60 mcg Subcutaneous Q Fri-1800   feeding supplement  237 mL Oral TID BM   guaiFENesin  1,200 mg Oral BID   heparin  5,000 Units Dialysis Once in dialysis   insulin aspart  0-9 Units Subcutaneous Q4H   metoCLOPramide (REGLAN) injection  10 mg Intravenous Q6H   metoprolol tartrate  7.5 mg Intravenous Q8H   pantoprazole (PROTONIX) IV  40 mg Intravenous Q12H   sacubitril-valsartan  1 tablet Oral BID   scopolamine  1 patch Transdermal Q72H   sodium chloride flush  3 mL Intravenous Q12H   Continuous Infusions:  sodium chloride     anticoagulant sodium citrate     cefTRIAXone (ROCEPHIN)  IV  2 g (01/07/23 2103)     LOS: 2 days    Time spent: 40 minutes    Ramiro Harvest, MD [facility]   To contact the attending provider between 7A-7P or the covering provider during after hours 7P-7A, please log into the web site [email or web address] and access using universal [place] password for that web site. If you do not have the password, please call the hospital operator.  01/08/2023, 2:36 PM

## 2023-01-08 NOTE — Progress Notes (Signed)
Intolerant of PO meds due to N/V (gastroparesis?). On IV metoprolol. BP is high, but better than yesterday. Back on metoclopramide. Will wait until PO intake improves before further meds adjustment.

## 2023-01-08 NOTE — Progress Notes (Signed)
Admit: 01/05/2023 LOS: 2  [name] ESRD on HHD 4x/wk with CAP, N/V  Subjective:  HD yesterday, 2.4 L UF Ongoing nausea and vomiting despite antiemetics presumed due to gastroparesis  09/14 0701 - 09/15 0700 In: -  Out: 2400   Filed Weights   01/07/23 0845 01/07/23 1432 01/08/23 0400  Weight: 93.4 kg 91 kg 92 kg    Scheduled Meds:  (feeding supplement) PROSource Plus  30 mL Oral BID BM   azithromycin  500 mg Oral Daily   calcitRIOL  0.5 mcg Oral Daily   Chlorhexidine Gluconate Cloth  6 each Topical Q0600   darbepoetin (ARANESP) injection - DIALYSIS  60 mcg Subcutaneous Q Fri-1800   feeding supplement  237 mL Oral TID BM   guaiFENesin  1,200 mg Oral BID   heparin  5,000 Units Dialysis Once in dialysis   insulin aspart  0-9 Units Subcutaneous Q4H   metoCLOPramide (REGLAN) injection  10 mg Intravenous Q6H   metoprolol tartrate  7.5 mg Intravenous Q8H   pantoprazole (PROTONIX) IV  40 mg Intravenous Q12H   sacubitril-valsartan  1 tablet Oral BID   sodium chloride flush  3 mL Intravenous Q12H   Continuous Infusions:  sodium chloride     anticoagulant sodium citrate     cefTRIAXone (ROCEPHIN)  IV 2 g (01/07/23 2103)   PRN Meds:.sodium chloride, acetaminophen **OR** acetaminophen, albuterol, alteplase, anticoagulant sodium citrate, heparin, HYDROcodone bit-homatropine, HYDROcodone-acetaminophen, lidocaine (PF), lidocaine-prilocaine, pentafluoroprop-tetrafluoroeth, prochlorperazine, sodium chloride flush  Current Labs: reviewed    Physical Exam:  Blood pressure (!) 167/94, pulse 84, temperature 98.9 F (37.2 C), resp. rate 18, height 5\' 9"  (1.753 m), weight 92 kg, SpO2 100%. NAD, but appears to not feel well Regular, normal S1 and S2 Diminished in the bases, otherwise clear bilaterally No significant edema Right forearm AVF with bruit and thrill Nonfocal, CN II through XII grossly intact Soft, nontender  Dialysis Orders:  NxStage HHD - MTThF. Follows at [facility]  - [phone] - 2881 3:22hr, 1K bath, EDW 91kg, AVF, heparin 6000 unit bolus - Mircera q month - last given 8/15  A ESRD on HHD MTThF using AVF; lives locally but followed by [facility] nephrology RML HCAP on ceftriaxone and azithromycin per TRH, slowly improving Reduced LVEF, cardiology following and adjusting medications including initiation of Entresto, beta-blocker Gastroparesis with persistent nausea and vomiting not yet improved Anemia of ESRD: On ESA q. 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 Morphine should not be used.  Baclofen should be avoided Avoid oral sodium phosphate and magnesium citrate based laxatives / bowel preps    [name] Heck MD 01/08/2023, 9:34 AM  Recent Labs  Lab 01/06/23 0657 01/07/23 0712 01/08/23 0735  NA 133* 135 135  K 3.8 3.9 3.6  CL 97* 100 99  CO2 24 21* 23  GLUCOSE 140* 124* 102*  BUN 38* 51* 32*  CREATININE 9.77* 11.59* 8.66*  CALCIUM 8.5* 8.2* 7.9*  PHOS 5.3* 6.0* 4.6   Recent Labs  Lab 01/05/23 1717 01/06/23 0657 01/07/23 0712 01/08/23 0735  WBC 5.6 5.2 6.8 5.0  NEUTROABS 4.6  --  5.2 3.2  HGB 10.1* 9.4* 9.7* 11.4*  HCT 32.7* 29.7* 30.9* 36.4*  MCV 92.9 92.5 90.9 90.5  PLT 125* 106* 122* 134*

## 2023-01-09 DIAGNOSIS — R7989 Other specified abnormal findings of blood chemistry: Secondary | ICD-10-CM | POA: Diagnosis not present

## 2023-01-09 DIAGNOSIS — R931 Abnormal findings on diagnostic imaging of heart and coronary circulation: Secondary | ICD-10-CM | POA: Diagnosis not present

## 2023-01-09 DIAGNOSIS — E119 Type 2 diabetes mellitus without complications: Secondary | ICD-10-CM | POA: Diagnosis not present

## 2023-01-09 DIAGNOSIS — J189 Pneumonia, unspecified organism: Secondary | ICD-10-CM | POA: Diagnosis not present

## 2023-01-09 LAB — CULTURE, RESPIRATORY W GRAM STAIN: Culture: NORMAL

## 2023-01-09 LAB — RENAL FUNCTION PANEL
Albumin: 2.9 g/dL — ABNORMAL LOW (ref 3.5–5.0)
Albumin: 3.1 g/dL — ABNORMAL LOW (ref 3.5–5.0)
Anion gap: 15 (ref 5–15)
Anion gap: 11 (ref 5–15)
BUN: 39 mg/dL — ABNORMAL HIGH (ref 6–20)
BUN: 17 mg/dL (ref 6–20)
CO2: 22 mmol/L (ref 22–32)
CO2: 27 mmol/L (ref 22–32)
Calcium: 8 mg/dL — ABNORMAL LOW (ref 8.9–10.3)
Calcium: 7.9 mg/dL — ABNORMAL LOW (ref 8.9–10.3)
Chloride: 101 mmol/L (ref 98–111)
Chloride: 97 mmol/L — ABNORMAL LOW (ref 98–111)
Creatinine, Ser: 10.76 mg/dL — ABNORMAL HIGH (ref 0.61–1.24)
Creatinine, Ser: 6.7 mg/dL — ABNORMAL HIGH (ref 0.61–1.24)
GFR, Estimated: 5 mL/min — ABNORMAL LOW (ref >60)
GFR, Estimated: 10 mL/min — ABNORMAL LOW (ref >60)
Glucose, Bld: 82 mg/dL (ref 70–99)
Glucose, Bld: 120 mg/dL — ABNORMAL HIGH (ref 70–99)
Phosphorus: 4.7 mg/dL — ABNORMAL HIGH (ref 2.5–4.6)
Phosphorus: 2.8 mg/dL (ref 2.5–4.6)
Potassium: 3.3 mmol/L — ABNORMAL LOW (ref 3.5–5.1)
Potassium: 3.7 mmol/L (ref 3.5–5.1)
Sodium: 138 mmol/L (ref 135–145)
Sodium: 135 mmol/L (ref 135–145)

## 2023-01-09 LAB — CBC
HCT: 35.3 % — ABNORMAL LOW (ref 39.0–52.0)
HCT: 36.8 % — ABNORMAL LOW (ref 39.0–52.0)
Hemoglobin: 11.1 g/dL — ABNORMAL LOW (ref 13.0–17.0)
Hemoglobin: 11.9 g/dL — ABNORMAL LOW (ref 13.0–17.0)
MCH: 29 pg (ref 26.0–34.0)
MCH: 30.5 pg (ref 26.0–34.0)
MCHC: 31.4 g/dL (ref 30.0–36.0)
MCHC: 32.3 g/dL (ref 30.0–36.0)
MCV: 92.2 fL (ref 80.0–100.0)
MCV: 94.4 fL (ref 80.0–100.0)
Platelets: 164 10*3/uL (ref 150–400)
Platelets: 155 10*3/uL (ref 150–400)
RBC: 3.83 MIL/uL — ABNORMAL LOW (ref 4.22–5.81)
RBC: 3.9 MIL/uL — ABNORMAL LOW (ref 4.22–5.81)
RDW: 14.2 % (ref 11.5–15.5)
RDW: 14.2 % (ref 11.5–15.5)
WBC: 5.3 10*3/uL (ref 4.0–10.5)
WBC: 5.3 10*3/uL (ref 4.0–10.5)
nRBC: 0 % (ref 0.0–0.2)
nRBC: 0 % (ref 0.0–0.2)

## 2023-01-09 LAB — HEMOGLOBIN A1C
Hgb A1c MFr Bld: 5.2 % (ref 4.8–5.6)
Mean Plasma Glucose: 103 mg/dL

## 2023-01-09 LAB — GLUCOSE, CAPILLARY
Glucose-Capillary: 119 mg/dL — ABNORMAL HIGH (ref 70–99)
Glucose-Capillary: 122 mg/dL — ABNORMAL HIGH (ref 70–99)
Glucose-Capillary: 123 mg/dL — ABNORMAL HIGH (ref 70–99)
Glucose-Capillary: 83 mg/dL (ref 70–99)
Glucose-Capillary: 84 mg/dL (ref 70–99)
Glucose-Capillary: 89 mg/dL (ref 70–99)

## 2023-01-09 MED ORDER — INSULIN ASPART 100 UNIT/ML IJ SOLN
0.0000 [IU] | Freq: Three times a day (TID) | INTRAMUSCULAR | Status: DC
  Administered 2023-01-09: 1 [IU] via SUBCUTANEOUS
  Administered 2023-01-11: 2 [IU] via SUBCUTANEOUS

## 2023-01-09 MED ORDER — PANTOPRAZOLE SODIUM 40 MG PO TBEC
40.0000 mg | DELAYED_RELEASE_TABLET | Freq: Two times a day (BID) | ORAL | Status: DC
  Administered 2023-01-09 – 2023-01-12 (×6): 40 mg via ORAL
  Filled 2023-01-09 (×6): qty 1

## 2023-01-09 NOTE — Evaluation (Signed)
Physical Therapy Evaluation Patient Details Name: [name] MRN: [id number] DOB: [birth year] Today's Date: 01/09/2023  History of Present Illness  Patient 47 year old gentleman presented to the ED 9/12 with 3-day history of fever of 101.2, nausea vomiting, productive cough of clear sputum and noted on chest x-ray and CT abdomen and pelvis to have a right middle lobe pneumonia.  COVID-19 PCR negative. PMH: ESRD on dialysis TTS, history of hypertension, diabetes mellitus type 2, gastroparesis  Clinical Impression  Pt admitted with above diagnosis. Pt with complex history of visual impairment. OT consult warranted to further assess vision.  Limited PT eval as once PT tested all canals for BPPV and then did oculomotor assessment, pt fatigued and did not feel well enough to continue. Pt also had HD this am. Will continue to work with pt and progress as able.  Pt currently with functional limitations due to the deficits listed below (see PT Problem List). Pt will benefit from acute skilled PT to increase their independence and safety with mobility to allow discharge.           If plan is discharge home, recommend the following: A little help with walking and/or transfers;A little help with bathing/dressing/bathroom;Assistance with cooking/housework;Help with stairs or ramp for entrance;Assist for transportation   Can travel by private vehicle        Equipment Recommendations None recommended by PT  Recommendations for Other Services       Functional Status Assessment Patient has had a recent decline in their functional status and demonstrates the ability to make significant improvements in function in a reasonable and predictable amount of time.     Precautions / Restrictions Precautions Precautions: Fall Restrictions Weight Bearing Restrictions: No      Mobility  Bed Mobility               General bed mobility comments: Pt declined due to HD this am.  Did allow bed level  assessment.    Transfers                        Ambulation/Gait                  Stairs            Wheelchair Mobility     Tilt Bed    Modified Rankin (Stroke Patients Only)       Balance                                             Pertinent Vitals/Pain Pain Assessment Pain Assessment: No/denies pain Breathing: normal Negative Vocalization: none Facial Expression: smiling or inexpressive Body Language: relaxed Consolability: no need to console PAINAD Score: 0    Home Living Family/patient expects to be discharged to:: Private residence Living Arrangements: Spouse/significant other Available Help at Discharge: Family;Available PRN/intermittently (wife works daytime) Type of Home: House Home Access: Stairs to enter Entrance Stairs-Rails: None Entrance Stairs-Number of Steps: 2   Home Layout: One level Home Equipment: Rollator (4 wheels);Shower seat      Prior Function Prior Level of Function : Independent/Modified Independent             Mobility Comments: states he used rollator at times ADLs Comments: I B/D PTA and did not use showere chair     Extremity/Trunk Assessment   Upper Extremity Assessment Upper  Extremity Assessment: Defer to OT evaluation    Lower Extremity Assessment Lower Extremity Assessment: Generalized weakness       Communication   Communication Communication: No apparent difficulties  Cognition Arousal: Alert Behavior During Therapy: WFL for tasks assessed/performed Overall Cognitive Status: Within Functional Limits for tasks assessed                                          General Comments      Exercises Other Exercises Other Exercises: initiated smooth pursuits with targets on wall with this being difficult for pt.  His eyes do not track together making activities difficult. Also impaired saccades.  Will recommend OT consult. Other Exercises: Also tested  all canals for BPPV and negative testing all canals.   Assessment/Plan    PT Assessment Patient needs continued PT services  PT Problem List Decreased activity tolerance;Decreased balance;Decreased mobility;Decreased knowledge of use of DME;Decreased safety awareness       PT Treatment Interventions DME instruction;Gait training;Functional mobility training;Therapeutic activities;Therapeutic exercise;Balance training;Patient/family education    PT Goals (Current goals can be found in the Care Plan section)  Acute Rehab PT Goals Patient Stated Goal: to get rid of nausea PT Goal Formulation: With patient Time For Goal Achievement: 01/23/23 Potential to Achieve Goals: Good    Frequency Min 1X/week     Co-evaluation               AM-PAC PT "6 Clicks" Mobility  Outcome Measure Help needed turning from your back to your side while in a flat bed without using bedrails?: None Help needed moving from lying on your back to sitting on the side of a flat bed without using bedrails?: A Little Help needed moving to and from a bed to a chair (including a wheelchair)?: A Little Help needed standing up from a chair using your arms (e.g., wheelchair or bedside chair)?: A Little Help needed to walk in hospital room?: A Little Help needed climbing 3-5 steps with a railing? : A Lot 6 Click Score: 18    End of Session   Activity Tolerance: Patient tolerated treatment well Patient left: in bed;with call bell/phone within reach;with bed alarm set Nurse Communication: Mobility status PT Visit Diagnosis: Muscle weakness (generalized) (M62.81)    Time: 1610-9604 PT Time Calculation (min) (ACUTE ONLY): 25 min   Charges:   PT Evaluation $PT Eval Moderate Complexity: 1 Mod PT Treatments $Therapeutic Activity: 8-22 mins PT General Charges $$ ACUTE PT VISIT: 1 Visit         Brandin Stetzer M,PT Acute Rehab Services [phone]   Bevelyn Buckles 01/09/2023, 4:22 PM

## 2023-01-09 NOTE — Progress Notes (Signed)
PHARMACIST - PHYSICIAN COMMUNICATION ? ?DR:   Janee Morn ? ?CONCERNING: IV to Oral Route Change Policy ? ?RECOMMENDATION: ?This patient is receiving protonix by the intravenous route.  Based on criteria approved by the Pharmacy and Therapeutics Committee, the intravenous medication(s) is/are being converted to the equivalent oral dose form(s). ? ? ?DESCRIPTION: ?These criteria include: ?The patient is eating (either orally or via tube) and/or has been taking other orally administered medications for a least 24 hours ?The patient has no evidence of active gastrointestinal bleeding or impaired GI absorption (gastrectomy, short bowel, patient on TNA or NPO). ? ?If you have questions about this conversion, please contact the Pharmacy Department  ?[]   [phone] )  [name] ?[]   [phone] )  [facility] ?[x]   [phone] )  Demitri Mesecher ?[]   [phone] )  [facility] ?[]   [phone] )  [facility]  ? ? ? ? ?

## 2023-01-09 NOTE — Plan of Care (Signed)

## 2023-01-09 NOTE — Progress Notes (Signed)
PT Cancellation Note  Patient Details Name: HUY KRIES MRN: [id number] DOB: [birth year]   Cancelled Treatment:    Reason Eval/Treat Not Completed: Patient at procedure or test/unavailable (Pt still in HD.  Will check back as able.)   Bevelyn Buckles 01/09/2023, 1:05 PM Leoni Goodness M,PT Acute Rehab Services [phone]

## 2023-01-09 NOTE — Progress Notes (Signed)
Pt receives home HD prior to admission through [facility] clinic per renal note. Will assist as needed.   Olivia Canter Renal Navigator [phone]

## 2023-01-09 NOTE — Plan of Care (Signed)
  Problem: Clinical Measurements: Goal: Ability to maintain clinical measurements within normal limits will improve Outcome: Progressing   Problem: Clinical Measurements: Goal: Will remain free from infection Outcome: Progressing   Problem: Clinical Measurements: Goal: Diagnostic test results will improve Outcome: Progressing   Problem: Clinical Measurements: Goal: Respiratory complications will improve Outcome: Progressing   Problem: Clinical Measurements: Goal: Respiratory complications will improve Outcome: Progressing   Problem: Clinical Measurements: Goal: Cardiovascular complication will be avoided Outcome: Progressing   Problem: Metabolic: Goal: Ability to maintain appropriate glucose levels will improve Outcome: Progressing   Problem: Metabolic: Goal: Ability to maintain appropriate glucose levels will improve Outcome: Progressing   Problem: Skin Integrity: Goal: Risk for impaired skin integrity will decrease Outcome: Progressing

## 2023-01-09 NOTE — TOC Initial Note (Signed)
Transition of Care [facility]) - Initial/Assessment Note    Patient Details  Name: [name] MRN: [id number] Date of Birth: 1976/04/25  Transition of [facility] CM/SW Contact:    Kermit Balo, RN Phone Number: 01/09/2023, 3:12 PM  Clinical Narrative:                 Pt is from home with his spouse. She works during the daytime.  Pt does home HD. He denies any issues.  He manages his own medications. He states he has not been consistent with timing of his medications but he does take them as prescribed.  Pt drives short distances. His spouse provides transportation for further trips.  Awaiting therapy evals.  TOC following.  Expected Discharge Plan: Home/Self Care Barriers to Discharge: Continued Medical Work up   Patient Goals and CMS Choice            Expected Discharge Plan and Services   Discharge Planning Services: CM Consult   Living arrangements for the past 2 months: Single Family Home                                      Prior Living Arrangements/Services Living arrangements for the past 2 months: Single Family Home Lives with:: Spouse, Minor Children Patient language and need for interpreter reviewed:: Yes Do you feel safe going back to the place where you live?: Yes            Criminal Activity/Legal Involvement Pertinent to Current Situation/Hospitalization: No - Comment as needed  Activities of Daily Living Home Assistive Devices/Equipment: None ADL Screening (condition at time of admission) Patient's cognitive ability adequate to safely complete daily activities?: Yes Is the patient deaf or have difficulty hearing?: No Does the patient have difficulty seeing, even when wearing glasses/contacts?: No Does the patient have difficulty concentrating, remembering, or making decisions?: No Patient able to express need for assistance with ADLs?: No Does the patient have difficulty dressing or bathing?: No Independently performs ADLs?: Yes  (appropriate for developmental age) Does the patient have difficulty walking or climbing stairs?: No Weakness of Legs: None Weakness of Arms/Hands: None  Permission Sought/Granted                  Emotional Assessment Appearance:: Appears stated age Attitude/Demeanor/Rapport: Engaged Affect (typically observed): Accepting Orientation: : Oriented to Self, Oriented to Place, Oriented to  Time, Oriented to Situation   Psych Involvement: No (comment)  Admission diagnosis:  CAP (community acquired pneumonia) [J18.9] Community acquired pneumonia of right lung, unspecified part of lung [J18.9] Patient Active Problem List   Diagnosis Date Noted   Nausea & vomiting 01/06/2023   Elevated troponin 01/06/2023   ESRD (end stage renal disease) (HCC) 01/06/2023   Abnormal echocardiogram 01/06/2023   Hypertension 01/06/2023   Anemia of chronic disease 01/06/2023   Type 2 diabetes mellitus without complication, without long-term current use of insulin (HCC) 01/06/2023   Abnormal TSH 01/06/2023   Gastroparesis 01/06/2023   Dilated cardiomyopathy (HCC) 01/06/2023   CAP (community acquired pneumonia) 01/05/2023   Abnormal CT of the abdomen 12/22/2022   Colon cancer screening 12/22/2022   PCP:  Starla Link, MD Pharmacy:   [id number] [place], [place] - [address] AT [facility] OF [address] & [address] [place] [address] Phone: [phone] Fax: [phone]  [facility]  Pharmacy [address] [place] [address] Phone: [phone] Fax: [phone]     Social Determinants of Health (SDOH) Social History: SDOH Screenings   Food Insecurity: No Food Insecurity (01/06/2023)  Housing: Low Risk  (01/06/2023)  Transportation Needs: No Transportation Needs (01/06/2023)  Utilities: Not At Risk (01/06/2023)  Financial Resource Strain: Low Risk  (10/30/2019)   Received from [facility], [facility]  Physical Activity: Insufficiently Active (10/30/2019)   Received from [facility], [facility]  Social Connections: Moderately Isolated (11/16/2018)   Received from [facility], [facility]  Stress: No Stress Concern Present (11/16/2018)   Received from [facility], [facility]  Tobacco Use: Low Risk  (01/05/2023)   SDOH Interventions:     Readmission Risk Interventions     No data to display

## 2023-01-09 NOTE — Procedures (Signed)
I was present at this dialysis session. I have reviewed the session itself and made appropriate changes.   K of 3.3 move to 4K.  UF goal 2.5L.  Good SpO2 on RA.  Hb stable 11s.  N/V hiccups seems somewhat improved.   Filed Weights   01/08/23 0400 01/09/23 0400 01/09/23 0900  Weight: 92 kg 89.9 kg 88.3 kg    Recent Labs  Lab 01/09/23 0911  NA 138  K 3.3*  CL 101  CO2 22  GLUCOSE 82  BUN 39*  CREATININE 10.76*  CALCIUM 8.0*  PHOS 4.7*    Recent Labs  Lab 01/05/23 1717 01/06/23 0657 01/07/23 0712 01/08/23 0735 01/09/23 0911  WBC 5.6   < > 6.8 5.0 5.3  NEUTROABS 4.6  --  5.2 3.2  --   HGB 10.1*   < > 9.7* 11.4* 11.1*  HCT 32.7*   < > 30.9* 36.4* 35.3*  MCV 92.9   < > 90.9 90.5 92.2  PLT 125*   < > 122* 134* 164   < > = values in this interval not displayed.    Scheduled Meds:  (feeding supplement) PROSource Plus  30 mL Oral BID BM   calcitRIOL  0.5 mcg Oral Daily   Chlorhexidine Gluconate Cloth  6 each Topical Q0600   darbepoetin (ARANESP) injection - DIALYSIS  60 mcg Subcutaneous Q Fri-1800   feeding supplement  237 mL Oral TID BM   guaiFENesin  1,200 mg Oral BID   heparin  5,000 Units Dialysis Once in dialysis   insulin aspart  0-9 Units Subcutaneous TID WC   metoCLOPramide (REGLAN) injection  10 mg Intravenous Q6H   metoprolol tartrate  7.5 mg Intravenous Q8H   pantoprazole  40 mg Oral BID   sacubitril-valsartan  1 tablet Oral BID   scopolamine  1 patch Transdermal Q72H   sodium chloride flush  3 mL Intravenous Q12H   Continuous Infusions:  sodium chloride     anticoagulant sodium citrate     cefTRIAXone (ROCEPHIN)  IV 2 g (01/08/23 2043)   PRN Meds:.sodium chloride, acetaminophen **OR** acetaminophen, albuterol, alteplase, alum & mag hydroxide-simeth, anticoagulant sodium citrate, heparin, HYDROcodone bit-homatropine, HYDROcodone-acetaminophen, lidocaine (PF), lidocaine-prilocaine, pentafluoroprop-tetrafluoroeth, prochlorperazine, sodium chloride flush    [name] Heck  MD 01/09/2023, 10:40 AM

## 2023-01-09 NOTE — Progress Notes (Signed)
   01/09/23 1245  Vitals  BP (!) 147/83  MAP (mmHg) 99  BP Location Left Arm  BP Method Automatic  Patient Position (if appropriate) Lying  Pulse Rate 87  Pulse Rate Source Monitor  ECG Heart Rate 87  Resp 14  Oxygen Therapy  SpO2 99 %  O2 Device Room Air  During Treatment Monitoring  Blood Flow Rate (mL/min) 0 mL/min  Arterial Pressure (mmHg) 41.61 mmHg  Venous Pressure (mmHg) -27.47 mmHg  TMP (mmHg) 18.99 mmHg  Ultrafiltration Rate (mL/min) 882 mL/min  Dialysate Flow Rate (mL/min) 299 ml/min  Duration of HD Treatment -hour(s) 3.5 hour(s)  Cumulative Fluid Removed (mL) per Treatment  2500.18  Post Treatment  Dialyzer Clearance Lightly streaked  Hemodialysis Intake (mL) 0 mL  Liters Processed 84  Fluid Removed (mL) 2500 mL  Tolerated HD Treatment Yes  Post-Hemodialysis Comments tolerated well tx completed  AVG/AVF Arterial Site Held (minutes) 7 minutes  AVG/AVF Venous Site Held (minutes) 7 minutes  Note  Patient Observations tolerated well goal met  Fistula / Graft Right Forearm Arteriovenous fistula  Placement Date/Time: 01/06/23 0026   Placed prior to admission: No  Orientation: Right  Access Location: Forearm  Access Type: Arteriovenous fistula  Site Condition No complications  Fistula / Graft Assessment Present;Thrill;Bruit  Status Deaccessed  Drainage Description None   Received patient in bed to unit.  Alert and oriented.  Informed consent signed and in chart.   TX duration:  Patient tolerated well.  Transported back to the room  Alert, without acute distress.  Hand-off given to patient's nurse.   Access used: RLA fistula Access issues: none  Total UF removed: 2500 Medication(s) given: none Post HD VS: see above Post HD weight: 85.6kg   [facility]

## 2023-01-09 NOTE — Progress Notes (Addendum)
PROGRESS NOTE    [name]  NWG:[id number] DOB: 12-03-1975 DOA: 01/05/2023 PCP: Starla Link, MD    No chief complaint on file.   Brief Narrative:  Patient 47 year old gentleman history of ESRD on dialysis TTS, history of hypertension, diabetes mellitus type 2, gastroparesis, presented to the ED with 3-day history of fever of 101.2, nausea vomiting, productive cough of clear sputum and noted on chest x-ray and CT abdomen and pelvis to have a right middle lobe pneumonia.  COVID-19 PCR negative.  Patient admitted placed empirically on IV antibiotics.  2D echo obtained noted a EF of 40 to 45% with global hypokinesis.  Cardiology consulted.  Nephrology consulted.   Assessment & Plan:   Principal Problem:   CAP (community acquired pneumonia) Active Problems:   Nausea & vomiting   Elevated troponin   ESRD (end stage renal disease) (HCC)   Abnormal echocardiogram   Hypertension   Anemia of chronic disease   Type 2 diabetes mellitus without complication, without long-term current use of insulin (HCC)   Abnormal TSH   Gastroparesis   Dilated cardiomyopathy (HCC)  #1 right middle lobe CAP, POA -Patient presenting with a productive cough of clear sputum, fever, nausea and vomiting x 3 days. -Chest x-ray done concerning for right middle lobe pneumonia and patient noted to have 2 or 3 characteristics of fever, leukocytosis, purulent sputum. -SARS coronavirus 2 PCR negative. -Sputum cultures with normal respiratory flora. -Blood cultures pending with no growth to date x 4 days.. -Urine Legionella antigen negative. -Urine pneumococcus antigen negative. -Continue IV Rocephin, Mucinex, PPI. -Discontinue azithromycin.  2.  Nausea vomiting/history of gastroparesis -Patient with nausea vomiting noted to be on Reglan prior to admission. -CT abdomen and pelvis done with cholelithiasis, moderate severe eating diet with urinary bladder wall thickening which may be secondary to chronic  bladder outlet obstruction.  Aortic atherosclerosis.  Small right pleural effusion.  Moderate to marked severity right middle lobe infiltrate. -Due to nausea and vomiting patient placed on Reglan 5 mg IV every 6 hours initially on presentation and dose increased to 10 mg IV every 6 hours due to ongoing nausea and emesis. -Patient also with complaints of dizziness with nausea and emesis whenever he turns his head and as such PT vestibular evaluation ordered.   -Head CT negative for any acute abnormalities.   -Patient with some clinical improvement today.   -Continue scopolamine patch.   -IV Compazine as needed.   -Patient tolerating clear liquids and full liquids and would like diet advancement as such we will place on a heart healthy diet and monitor.    3.  Abnormal 2D echo EF 40 to 45%, dilated cardiomyopathy -Patient noted to have minimally elevated troponins on admission, 2D echo was obtained which showed a EF of 40 to 45%, global hypokinesis, grade 2 diastolic dysfunction, mildly dilated left atrial size, trivial MVR, mildly dilated pulmonary artery. -Due to abnormal 2D echo cardiology consulted will assess patient and feel no indication for ischemic workup at this time with no acute EKG changes and troponin minimally elevated. -Cardiology recommended discontinuation of patient's Norvasc and Procardia. -Entresto started per cardiology and are recommending repeat 2D echo in 3 months to see if any improvement. -Patient noted to be on Coreg 25 mg twice daily prior to admission which was initially continued however due to ongoing emesis Coreg discontinued and patient placed on IV Lopressor. -Cardiology following and appreciate input and recommendations.    4.  ESRD on HHD MTuThF -Patient noted to  have had full hemodialysis on 01/05/2023. -Patient seen in consultation by nephrology and patient underwent hemodialysis, 01/07/2023 and today 01/09/2023.. -Per nephrology.  5.  Hypertension -Norvasc  and Procardia discontinued per cardiology recommendations. -Patient on Coreg home dose, Entresto started per cardiology 01/06/2023.  -Patient noted not to have received antihypertensive medications prior to hemodialysis.   -Post hemodialysis on 01/07/2023, patient actively vomiting unable to keep pills down and as such Coreg was discontinued and patient placed on IV Lopressor.   -Increase IV Lopressor to 7.5 mg every 8 hours. -Continue Entresto when able to take oral intake.   6.  Anemia of ESRD -Hemoglobin currently at 11.9. -Per nephrology.  7.  Well-controlled diabetes mellitus type 2 -Hemoglobin A1c 5.3.   -CBG 83 this morning.   -SSI.  8.  Abnormal TSH -TSH noted at 16.771. -Free T4 within normal limits at 0.69. -Outpatient follow-up with repeat labs done in 4 to 6 weeks.      DVT prophylaxis: SCDs Code Status: Full Family Communication: Updated patient.  No family at bedside.  Disposition: Home when clinically improved and tolerating oral intake with resolution of nausea and emesis..  Status is: Inpatient Remains inpatient appropriate because: Severity of illness   Consultants:  Nephrology: Dr. [name] 01/06/2023 Cardiology: Dr. Eden Emms 01/06/2023  Procedures:  CT abdomen pelvis 01/05/2023 Chest x-ray 01/05/2023 2D echo 01/06/2023 CT head 01/08/2023  Antimicrobials:  Anti-infectives (From admission, onward)    Start     Dose/Rate Route Frequency Ordered Stop   01/06/23 2200  azithromycin (ZITHROMAX) tablet 500 mg  Status:  Discontinued        500 mg Oral Daily 01/06/23 1126 01/09/23 0758   01/06/23 2000  cefTRIAXone (ROCEPHIN) 2 g in sodium chloride 0.9 % 100 mL IVPB        2 g 200 mL/hr over 30 Minutes Intravenous Every 24 hours 01/06/23 0903     01/05/23 2015  azithromycin (ZITHROMAX) 500 mg in sodium chloride 0.9 % 250 mL IVPB  Status:  Discontinued        500 mg 250 mL/hr over 60 Minutes Intravenous Every 24 hours 01/05/23 2014 01/06/23 1126   01/05/23 2015   cefTRIAXone (ROCEPHIN) 1 g in sodium chloride 0.9 % 100 mL IVPB  Status:  Discontinued        1 g 200 mL/hr over 30 Minutes Intravenous Every 24 hours 01/05/23 2014 01/06/23 9604         Subjective: Patient laying in bed.  States he feels better than he did yesterday.  Nausea and emesis improving.  Tolerating clear liquids.  Denies any chest pain or shortness of breath.  Still with a productive cough.  Stated last emesis was during hemodialysis this morning.  Would like to try to advance diet.  Objective: Vitals:   01/09/23 1200 01/09/23 1230 01/09/23 1245 01/09/23 1327  BP: (!) 141/87 126/83 (!) 147/83 (!) 157/91  Pulse: 81 85 87 88  Resp: 15 18 14 18   Temp:      TempSrc:      SpO2: 100% 99% 99% 100%  Weight:      Height:        Intake/Output Summary (Last 24 hours) at 01/09/2023 1433 Last data filed at 01/09/2023 1340 Gross per 24 hour  Intake 120 ml  Output 2500 ml  Net -2380 ml    Filed Weights   01/08/23 0400 01/09/23 0400 01/09/23 0900  Weight: 92 kg 89.9 kg 88.3 kg    Examination:  General exam: NAD.  Respiratory system: CTAB.  No wheezes, no crackles, no rhonchi.  Fair air movement.  Speaking in full sentences.  Cardiovascular system: Regular rate rhythm no murmurs rubs or gallops.  No JVD.  No pitting lower extremity edema.  Gastrointestinal system: Abdomen is soft, nontender, nondistended, positive bowel sounds.  No rebound.  No guarding.  Central nervous system: Alert and oriented. No focal neurological deficits. Extremities: Symmetric 5 x 5 power. Skin: No rashes, lesions or ulcers Psychiatry: Judgement and insight appear normal. Mood & affect appropriate.     Data Reviewed: I have personally reviewed following labs and imaging studies  CBC: Recent Labs  Lab 01/05/23 1717 01/06/23 0657 01/07/23 0712 01/08/23 0735 01/09/23 0911  WBC 5.6 5.2 6.8 5.0 5.3  NEUTROABS 4.6  --  5.2 3.2  --   HGB 10.1* 9.4* 9.7* 11.4* 11.1*  HCT 32.7* 29.7* 30.9* 36.4*  35.3*  MCV 92.9 92.5 90.9 90.5 92.2  PLT 125* 106* 122* 134* 164    Basic Metabolic Panel: Recent Labs  Lab 01/05/23 1717 01/06/23 0657 01/07/23 0712 01/08/23 0735 01/09/23 0911  NA 134* 133* 135 135 138  K 3.9 3.8 3.9 3.6 3.3*  CL 97* 97* 100 99 101  CO2 21* 24 21* 23 22  GLUCOSE 161* 140* 124* 102* 82  BUN 29* 38* 51* 32* 39*  CREATININE 8.51* 9.77* 11.59* 8.66* 10.76*  CALCIUM 9.0 8.5* 8.2* 7.9* 8.0*  MG  --  2.1  --   --   --   PHOS  --  5.3* 6.0* 4.6 4.7*    GFR: Estimated Creatinine Clearance: 9.4 mL/min (A) (by C-G formula based on SCr of 10.76 mg/dL (H)).  Liver Function Tests: Recent Labs  Lab 01/05/23 1717 01/06/23 0657 01/07/23 0712 01/08/23 0735 01/09/23 0911  AST 15 12*  --   --   --   ALT 16 14  --   --   --   ALKPHOS 47 33*  --   --   --   BILITOT 0.8 0.4  --   --   --   PROT 7.4 6.3*  --   --   --   ALBUMIN 3.9 3.2* 2.9* 2.9* 2.9*    CBG: Recent Labs  Lab 01/08/23 2023 01/09/23 0051 01/09/23 0428 01/09/23 0815 01/09/23 1326  GLUCAP 102* 119* 123* 83 84     Recent Results (from the past 240 hour(s))  Culture, blood (Routine x 2)     Status: None (Preliminary result)   Collection Time: 01/05/23  5:18 PM   Specimen: BLOOD  Result Value Ref Range Status   Specimen Description BLOOD SITE NOT SPECIFIED  Final   Special Requests   Final    BOTTLES DRAWN AEROBIC AND ANAEROBIC Blood Culture results may not be optimal due to an inadequate volume of blood received in culture bottles   Culture   Final    NO GROWTH 4 DAYS Performed at [facility], [address] [place], [place] [address]    Report Status PENDING  Incomplete  Culture, blood (Routine x 2)     Status: None (Preliminary result)   Collection Time: 01/05/23  7:03 PM   Specimen: BLOOD  Result Value Ref Range Status   Specimen Description BLOOD SITE NOT SPECIFIED  Final   Special Requests   Final    BOTTLES DRAWN AEROBIC AND ANAEROBIC Blood Culture adequate volume    Culture   Final    NO GROWTH 4 DAYS Performed at [facility], [address] [place], [place] [address]    Report Status PENDING  Incomplete  SARS Coronavirus 2 by RT PCR (hospital order, performed in [facility] hospital lab) *cepheid single result test* Anterior Nasal Swab     Status: None   Collection Time: 01/05/23  8:01 PM   Specimen: Anterior Nasal Swab  Result Value Ref Range Status   SARS Coronavirus 2 by RT PCR NEGATIVE NEGATIVE Final    Comment: Performed at [facility], [address] [place], [place] [address]  Expectorated Sputum Assessment w Gram Stain, Rflx to Resp Cult     Status: None   Collection Time: 01/06/23 12:50 AM   Specimen: Expectorated Sputum  Result Value Ref Range Status   Specimen Description EXPECTORATED SPUTUM  Final   Special Requests NONE  Final   Sputum evaluation   Final    THIS SPECIMEN IS ACCEPTABLE FOR SPUTUM CULTURE Performed at [facility], [address], [place], [place] [address]    Report Status 01/06/2023 FINAL  Final  Culture, Respiratory w Gram Stain     Status: None   Collection Time: 01/06/23 12:50 AM  Result Value Ref Range Status   Specimen Description EXPECTORATED SPUTUM  Final   Special Requests NONE Reflexed from [id number]  Final   Gram Stain   Final    ABUNDANT WBC PRESENT,BOTH PMN AND MONONUCLEAR FEW GRAM VARIABLE ROD FEW GRAM POSITIVE COCCI IN PAIRS IN SINGLES    Culture   Final    Normal respiratory flora-no Staph aureus or Pseudomonas seen Performed at [facility], [address] [place], [place] [address]    Report Status 01/09/2023 FINAL  Final         Radiology Studies: CT HEAD WO CONTRAST ( )  Result Date: 01/08/2023 CLINICAL DATA:  Peripheral vertigo EXAM: CT HEAD WITHOUT CONTRAST TECHNIQUE: Contiguous axial images were obtained from the base of the skull through the vertex without intravenous contrast. RADIATION DOSE REDUCTION: This exam was performed according to the  departmental dose-optimization program which includes automated exposure control, adjustment of the mA and/or kV according to patient size and/or use of iterative reconstruction technique. COMPARISON:  None Available. FINDINGS: Brain: There is no acute intracranial hemorrhage, extra-axial fluid collection, or acute infarct Parenchymal volume is normal. The ventricles are normal in size. Gray-white differentiation is preserved. The pituitary and suprasellar region are normal. There is no mass lesion. There is no mass effect or midline shift. Vascular: There is calcification of the bilateral carotid siphons and vertebral arteries. Skull: Choose Sinuses/Orbits: The imaged paranasal sinuses are clear. There is bilateral proptosis. The extraocular muscles are normal. There is no orbital mass. Bilateral lens implants are in place. Other: The mastoid air cells and middle ear cavities are clear. IMPRESSION: No acute intracranial pathology. Electronically Signed   By: Lesia Hausen M.D.   On: 01/08/2023 15:52        Scheduled Meds:  (feeding supplement) PROSource Plus  30 mL Oral BID BM   calcitRIOL  0.5 mcg Oral Daily   Chlorhexidine Gluconate Cloth  6 each Topical Q0600   darbepoetin (ARANESP) injection - DIALYSIS  60 mcg Subcutaneous Q Fri-1800   feeding supplement  237 mL Oral TID BM   guaiFENesin  1,200 mg Oral BID   insulin aspart  0-9 Units Subcutaneous TID WC   metoCLOPramide (REGLAN) injection  10 mg Intravenous Q6H   metoprolol tartrate  7.5 mg Intravenous Q8H   pantoprazole  40 mg Oral BID   sacubitril-valsartan  1 tablet Oral BID   scopolamine  1 patch Transdermal Q72H   sodium chloride flush  3 mL Intravenous Q12H   Continuous Infusions:  sodium chloride     cefTRIAXone (ROCEPHIN)  IV 2 g (01/08/23 2043)     LOS: 3 days    Time spent: 40 minutes    Ramiro Harvest, MD [facility]   To contact the attending provider between 7A-7P or the covering provider during after  hours 7P-7A, please log into the web site [email or web address] and access using universal [place] password for that web site. If you do not have the password, please call the hospital operator.  01/09/2023, 2:33 PM

## 2023-01-09 NOTE — Progress Notes (Signed)
PT Cancellation Note  Patient Details Name: [name] MRN: [id number] DOB: [birth year]   Cancelled Treatment:    Reason Eval/Treat Not Completed: Patient at procedure or test/unavailable (Pt in HD. Will return as able.)   Bevelyn Buckles 01/09/2023, 8:47 AM Deya Bigos M,PT Acute Rehab Services [phone]

## 2023-01-10 DIAGNOSIS — E119 Type 2 diabetes mellitus without complications: Secondary | ICD-10-CM | POA: Diagnosis not present

## 2023-01-10 DIAGNOSIS — J189 Pneumonia, unspecified organism: Secondary | ICD-10-CM | POA: Diagnosis not present

## 2023-01-10 DIAGNOSIS — N186 End stage renal disease: Secondary | ICD-10-CM | POA: Diagnosis not present

## 2023-01-10 DIAGNOSIS — R7989 Other specified abnormal findings of blood chemistry: Secondary | ICD-10-CM | POA: Diagnosis not present

## 2023-01-10 LAB — RENAL FUNCTION PANEL
Albumin: 2.9 g/dL — ABNORMAL LOW (ref 3.5–5.0)
Anion gap: 11 (ref 5–15)
BUN: 23 mg/dL — ABNORMAL HIGH (ref 6–20)
CO2: 25 mmol/L (ref 22–32)
Calcium: 7.7 mg/dL — ABNORMAL LOW (ref 8.9–10.3)
Chloride: 97 mmol/L — ABNORMAL LOW (ref 98–111)
Creatinine, Ser: 8.44 mg/dL — ABNORMAL HIGH (ref 0.61–1.24)
GFR, Estimated: 7 mL/min — ABNORMAL LOW (ref >60)
Glucose, Bld: 83 mg/dL (ref 70–99)
Phosphorus: 3.7 mg/dL (ref 2.5–4.6)
Potassium: 4.3 mmol/L (ref 3.5–5.1)
Sodium: 133 mmol/L — ABNORMAL LOW (ref 135–145)

## 2023-01-10 LAB — CULTURE, BLOOD (ROUTINE X 2)
Culture: NO GROWTH
Culture: NO GROWTH
Special Requests: ADEQUATE

## 2023-01-10 LAB — CBC
HCT: 32.9 % — ABNORMAL LOW (ref 39.0–52.0)
Hemoglobin: 10.9 g/dL — ABNORMAL LOW (ref 13.0–17.0)
MCH: 31.9 pg (ref 26.0–34.0)
MCHC: 33.1 g/dL (ref 30.0–36.0)
MCV: 96.2 fL (ref 80.0–100.0)
Platelets: 168 10*3/uL (ref 150–400)
RBC: 3.42 MIL/uL — ABNORMAL LOW (ref 4.22–5.81)
RDW: 13.9 % (ref 11.5–15.5)
WBC: 4.3 10*3/uL (ref 4.0–10.5)
nRBC: 0 % (ref 0.0–0.2)

## 2023-01-10 LAB — GLUCOSE, CAPILLARY
Glucose-Capillary: 101 mg/dL — ABNORMAL HIGH (ref 70–99)
Glucose-Capillary: 106 mg/dL — ABNORMAL HIGH (ref 70–99)
Glucose-Capillary: 112 mg/dL — ABNORMAL HIGH (ref 70–99)
Glucose-Capillary: 113 mg/dL — ABNORMAL HIGH (ref 70–99)
Glucose-Capillary: 81 mg/dL (ref 70–99)
Glucose-Capillary: 96 mg/dL (ref 70–99)

## 2023-01-10 MED ORDER — CHLORHEXIDINE GLUCONATE CLOTH 2 % EX PADS
6.0000 | MEDICATED_PAD | Freq: Every day | CUTANEOUS | Status: DC
  Administered 2023-01-11 – 2023-01-12 (×2): 6 via TOPICAL

## 2023-01-10 NOTE — Evaluation (Signed)
Occupational Therapy Evaluation Patient Details Name: [name] MRN: [id number] DOB: 03/06/1976 Today's Date: 01/10/2023   History of Present Illness Patient 47 year old gentleman presented to the ED 9/12 with 3-day history of fever of 101.2, nausea vomiting, productive cough of clear sputum and noted on chest x-ray and CT abdomen and pelvis to have a right middle lobe pneumonia.  COVID-19 PCR negative. PMH: ESRD on dialysis TTS, history of hypertension, diabetes mellitus type 2, gastroparesis   Clinical Impression   [name] was evaluated s/p the above admission list. He is mod I, lives with his wife who works and drives at baseline. Upon evaluation the pt was limited by baseline low vision, unsteady gait, nausea and decreased activity tolerance. Overall he needed CGA cues and RW for hallway mobility with multiple LOBs. Pt denied dizziness with turns and mobility this date. Due to the deficits listed below the pt also needs up to CGA for ADLs for balance and safety. Pt will benefit from continued acute OT services to further address low vision strategies and d/c home with support of family.        If plan is discharge home, recommend the following: A little help with walking and/or transfers;A little help with bathing/dressing/bathroom;Assistance with cooking/housework;Assist for transportation    Functional Status Assessment  Patient has had a recent decline in their functional status and demonstrates the ability to make significant improvements in function in a reasonable and predictable amount of time.  Equipment Recommendations  None recommended by OT       Precautions / Restrictions Precautions Precautions: Fall Precaution Comments: low vision Restrictions Weight Bearing Restrictions: No      Mobility Bed Mobility Overal bed mobility: Needs Assistance Bed Mobility: Supine to Sit, Sit to Supine     Supine to sit: Supervision Sit to supine: Supervision         Transfers Overall transfer level: Needs assistance Equipment used: Rolling walker (2 wheels) Transfers: Sit to/from Stand Sit to Stand: Contact guard assist           General transfer comment: reliant on RW, pt had a few LOBs      Balance Overall balance assessment: Needs assistance Sitting-balance support: Feet supported Sitting balance-Leahy Scale: Good     Standing balance support: Single extremity supported, During functional activity Standing balance-Leahy Scale: Fair                             ADL either performed or assessed with clinical judgement   ADL Overall ADL's : Needs assistance/impaired Eating/Feeding: Independent;Sitting   Grooming: Contact guard assist;Standing   Upper Body Bathing: Set up;Sitting   Lower Body Bathing: Contact guard assist;Sit to/from stand   Upper Body Dressing : Set up;Sitting   Lower Body Dressing: Contact guard assist;Sit to/from stand   Toilet Transfer: Contact guard assist;Ambulation;Rolling walker (2 wheels)   Toileting- Clothing Manipulation and Hygiene: Supervision/safety;Sitting/lateral lean       Functional mobility during ADLs: Contact guard assist;Rolling walker (2 wheels) General ADL Comments: unsetady gait, cues for low vision in unfamiliar environment     Vision Baseline Vision/History:  (diabetic retinopathy) Ability to See in Adequate Light: 3 Highly impaired Patient Visual Report: No change from baseline Vision Assessment?: Yes Eye Alignment: Impaired (comment) Ocular Range of Motion: Impaired-to be further tested in functional context Alignment/Gaze Preference: Within Defined Limits Visual Fields: Other (comment) (pt only has central vision) Diplopia Assessment: Disappears with one eye closed (pt states  DV "comes and goes") Depth Perception: Overshoots;Undershoots Additional Comments: pt states his vision has been stable for ~3 years since his last eye surgery. he does not have  peripheral vision, and central vision is occluded by floaters. pt states his L eye is better for distance and his R eye is better for reading. R eye does not always track with L     Perception Perception: Not tested       Praxis Praxis: Not tested       Pertinent Vitals/Pain Pain Assessment Pain Assessment: No/denies pain Pain Location: "just nausea" Pain Intervention(s): Monitored during session     Extremity/Trunk Assessment Upper Extremity Assessment Upper Extremity Assessment: Overall WFL for tasks assessed   Lower Extremity Assessment Lower Extremity Assessment: Defer to PT evaluation   Cervical / Trunk Assessment Cervical / Trunk Assessment: Normal   Communication Communication Communication: No apparent difficulties   Cognition Arousal: Alert Behavior During Therapy: WFL for tasks assessed/performed Overall Cognitive Status: Within Functional Limits for tasks assessed                                 General Comments: limited insight on how vision deficits impact safety     General Comments  VSS on RA, no overt dizziness throughout session            Home Living Family/patient expects to be discharged to:: Private residence Living Arrangements: Spouse/significant other;Children Available Help at Discharge: Family;Available PRN/intermittently Type of Home: House Home Access: Stairs to enter [employer] of Steps: 2 Entrance Stairs-Rails: None Home Layout: One level     Bathroom Shower/Tub: Chief Strategy Officer: Standard     Home Equipment: Rollator (4 wheels);Shower seat   Additional Comments: dtr is 12      Prior Functioning/Environment Prior Level of Function : Independent/Modified Independent             Mobility Comments: states he used rollator at times ADLs Comments: indep ADLs, drives dtr to school daily        OT Problem List: Decreased strength;Decreased range of motion;Decreased activity  tolerance;Impaired balance (sitting and/or standing);Decreased safety awareness;Decreased knowledge of use of DME or AE;Decreased knowledge of precautions      OT Treatment/Interventions: Self-care/ADL training;Therapeutic exercise;Energy conservation;DME and/or AE instruction;Visual/perceptual remediation/compensation;Patient/family education;Therapeutic activities;Balance training    OT Goals(Current goals can be found in the care plan section) Acute Rehab OT Goals Patient Stated Goal: home soon OT Goal Formulation: With patient Time For Goal Achievement: 01/24/23 Potential to Achieve Goals: Good ADL Goals Pt Will Perform Grooming: with modified independence Pt Will Perform Lower Body Dressing: with modified independence;sit to/from stand Pt Will Transfer to Toilet: with modified independence;ambulating Additional ADL Goal #1: Pt will navigate hospital environment with mod I utilizing low vision strategies to demonstrated reduced fall risk  OT Frequency: Min 1X/week       AM-PAC OT "6 Clicks" Daily Activity     Outcome Measure Help from another person eating meals?: None Help from another person taking care of personal grooming?: A Little Help from another person toileting, which includes using toliet, bedpan, or urinal?: A Little Help from another person bathing (including washing, rinsing, drying)?: A Little Help from another person to put on and taking off regular upper body clothing?: A Little Help from another person to put on and taking off regular lower body clothing?: A Little 6 Click Score: 19   End of  Session Equipment Utilized During Treatment: Rolling walker (2 wheels) Nurse Communication: Mobility status  Activity Tolerance: Patient tolerated treatment well Patient left: in bed;with call bell/phone within reach  OT Visit Diagnosis: Unsteadiness on feet (R26.81);Other abnormalities of gait and mobility (R26.89);Muscle weakness (generalized) (M62.81);Low vision, both  eyes (H54.2)                Time: 8119-1478 OT Time Calculation (min): 19 min Charges:  OT General Charges $OT Visit: 1 Visit OT Evaluation $OT Eval Moderate Complexity: 1 Mod  Derenda Mis, OTR/L Acute Rehabilitation Services Office [phone] Secure Chat Communication Preferred   Donia Pounds 01/10/2023, 1:09 PM

## 2023-01-10 NOTE — Progress Notes (Signed)
PROGRESS NOTE    [name]  YQI:[id number] DOB: [birth year] DOA: 01/05/2023 PCP: Starla Link, MD    No chief complaint on file.   Brief Narrative:  Patient 47 year old gentleman history of ESRD on dialysis TTS, history of hypertension, diabetes mellitus type 2, gastroparesis, presented to the ED with 3-day history of fever of 101.2, nausea vomiting, productive cough of clear sputum and noted on chest x-ray and CT abdomen and pelvis to have a right middle lobe pneumonia.  COVID-19 PCR negative.  Patient admitted placed empirically on IV antibiotics.  2D echo obtained noted a EF of 40 to 45% with global hypokinesis.  Cardiology consulted.  Nephrology consulted.   Assessment & Plan:   Principal Problem:   CAP (community acquired pneumonia) Active Problems:   Nausea & vomiting   Elevated troponin   ESRD (end stage renal disease) (HCC)   Abnormal echocardiogram   Hypertension   Anemia of chronic disease   Type 2 diabetes mellitus without complication, without long-term current use of insulin (HCC)   Abnormal TSH   Gastroparesis   Dilated cardiomyopathy (HCC)  #1 right middle lobe CAP, POA -Patient presenting with a productive cough of clear sputum, fever, nausea and vomiting x 3 days. -Chest x-ray done concerning for right middle lobe pneumonia and patient noted to have 2 or 3 characteristics of fever, leukocytosis, purulent sputum. -SARS coronavirus 2 PCR negative. -Sputum cultures with normal respiratory flora. -Blood cultures negative x 5 days. . -Urine Legionella antigen negative. -Urine pneumococcus antigen negative. -Continue IV Rocephin, Mucinex, PPI. -Azithromycin discontinued.  2.  Nausea vomiting/history of gastroparesis -Patient with nausea vomiting noted to be on Reglan prior to admission. -CT abdomen and pelvis done with cholelithiasis, moderate severe eating diet with urinary bladder wall thickening which may be secondary to chronic bladder outlet  obstruction.  Aortic atherosclerosis.  Small right pleural effusion.  Moderate to marked severity right middle lobe infiltrate. -Due to nausea and vomiting patient placed on Reglan 5 mg IV every 6 hours initially on presentation and dose increased to 10 mg IV every 6 hours due to ongoing nausea and emesis. -Patient also with complaints of dizziness with nausea and emesis whenever he turns his head and as such PT vestibular evaluation ordered.   -Head CT negative for any acute abnormalities.   -Patient improving clinically slowly daily.   -Continue scopolamine patch, IV Compazine as needed.   -Tolerating heart healthy diet.   -Supportive care.    3.  Abnormal 2D echo EF 40 to 45%, dilated cardiomyopathy -Patient noted to have minimally elevated troponins on admission, 2D echo was obtained which showed a EF of 40 to 45%, global hypokinesis, grade 2 diastolic dysfunction, mildly dilated left atrial size, trivial MVR, mildly dilated pulmonary artery. -Due to abnormal 2D echo cardiology consulted will assess patient and feel no indication for ischemic workup at this time with no acute EKG changes and troponin minimally elevated. -Cardiology recommended discontinuation of patient's Norvasc and Procardia. -Entresto started per cardiology and are recommending repeat 2D echo in 3 months to see if any improvement. -Patient noted to be on Coreg 25 mg twice daily prior to admission which was initially continued however due to ongoing emesis Coreg discontinued and patient placed on IV Lopressor. -Cardiology following and appreciate input and recommendations.    4.  ESRD on HHD MTuThF -Patient noted to have had full hemodialysis on 01/05/2023. -Patient seen in consultation by nephrology and patient underwent hemodialysis, 01/07/2023 and 01/09/2023.. -Per nephrology.  5.  Hypertension -Norvasc and Procardia discontinued per cardiology recommendations. -Patient on Coreg home dose, Entresto started per  cardiology 01/06/2023.  -Patient noted not to have received antihypertensive medications prior to hemodialysis.   -Post hemodialysis on 01/07/2023, patient actively vomiting unable to keep pills down and as such Coreg was discontinued and patient placed on IV Lopressor.   -Continue IV Lopressor every 8 hours.   -Unable to tolerate intake oral intake continue Entresto.   6.  Anemia of ESRD -Hemoglobin stable at 10.9.  -Per nephrology.  7.  Well-controlled diabetes mellitus type 2 -Hemoglobin A1c 5.3.   -CBG 81 this morning.   -SSI.    8.  Abnormal TSH -TSH noted at 16.771. -Free T4 within normal limits at 0.69. -Outpatient follow-up with repeat labs done in 4 to 6 weeks.      DVT prophylaxis: SCDs Code Status: Full Family Communication: Updated patient.  No family at bedside.  Disposition: Home when clinically improved and tolerating oral intake with resolution of nausea and emesis..  Status is: Inpatient Remains inpatient appropriate because: Severity of illness   Consultants:  Nephrology: Dr. [name] 01/06/2023 Cardiology: Dr. Eden Emms 01/06/2023  Procedures:  CT abdomen pelvis 01/05/2023 Chest x-ray 01/05/2023 2D echo 01/06/2023 CT head 01/08/2023  Antimicrobials:  Anti-infectives (From admission, onward)    Start     Dose/Rate Route Frequency Ordered Stop   01/06/23 2200  azithromycin (ZITHROMAX) tablet 500 mg  Status:  Discontinued        500 mg Oral Daily 01/06/23 1126 01/09/23 0758   01/06/23 2000  cefTRIAXone (ROCEPHIN) 2 g in sodium chloride 0.9 % 100 mL IVPB        2 g 200 mL/hr over 30 Minutes Intravenous Every 24 hours 01/06/23 0903     01/05/23 2015  azithromycin (ZITHROMAX) 500 mg in sodium chloride 0.9 % 250 mL IVPB  Status:  Discontinued        500 mg 250 mL/hr over 60 Minutes Intravenous Every 24 hours 01/05/23 2014 01/06/23 1126   01/05/23 2015  cefTRIAXone (ROCEPHIN) 1 g in sodium chloride 0.9 % 100 mL IVPB  Status:  Discontinued        1 g 200 mL/hr over  30 Minutes Intravenous Every 24 hours 01/05/23 2014 01/06/23 0903         Subjective: Patient sitting up in bed.  States nausea and vomiting improving daily.  Was able to tolerate some oral intake.  Denies any chest pain.  Still with a productive cough.    Objective: Vitals:   01/10/23 0725 01/10/23 1155 01/10/23 1300 01/10/23 1645  BP: (!) 158/92 (!) 143/88  (!) 155/89  Pulse: 80 85 92 79  Resp: 18 20  17   Temp: 98.5 F (36.9 C) 98.2 F (36.8 C)  98.4 F (36.9 C)  TempSrc: Oral Axillary  Oral  SpO2: 100% 99%  100%  Weight:      Height:        Intake/Output Summary (Last 24 hours) at 01/10/2023 1725 Last data filed at 01/10/2023 1026 Gross per 24 hour  Intake 180 ml  Output 1 ml  Net 179 ml    Filed Weights   01/08/23 0400 01/09/23 0400 01/09/23 0900  Weight: 92 kg 89.9 kg 88.3 kg    Examination:  General exam: NAD. Respiratory system: Clear to auscultation bilaterally.  No wheezes, no crackles, no rhonchi.  Fair air movement.  Speaking in full sentences.  Cardiovascular system: RRR no murmurs rubs or gallops.  No JVD.  No pitting lower extremity edema.  Gastrointestinal system: Abdomen is soft, nontender, nondistended, positive bowel sounds.  No rebound.  No guarding.   Central nervous system: Alert and oriented. No focal neurological deficits. Extremities: Symmetric 5 x 5 power. Skin: No rashes, lesions or ulcers Psychiatry: Judgement and insight appear normal. Mood & affect appropriate.     Data Reviewed: I have personally reviewed following labs and imaging studies  CBC: Recent Labs  Lab 01/05/23 1717 01/06/23 0657 01/07/23 0712 01/08/23 0735 01/09/23 0911 01/09/23 1503 01/10/23 0720  WBC 5.6   < > 6.8 5.0 5.3 5.3 4.3  NEUTROABS 4.6  --  5.2 3.2  --   --   --   HGB 10.1*   < > 9.7* 11.4* 11.1* 11.9* 10.9*  HCT 32.7*   < > 30.9* 36.4* 35.3* 36.8* 32.9*  MCV 92.9   < > 90.9 90.5 92.2 94.4 96.2  PLT 125*   < > 122* 134* 164 155 168   < > = values in  this interval not displayed.    Basic Metabolic Panel: Recent Labs  Lab 01/06/23 0657 01/07/23 0712 01/08/23 0735 01/09/23 0911 01/09/23 1503 01/10/23 0720  NA 133* 135 135 138 135 133*  K 3.8 3.9 3.6 3.3* 3.7 4.3  CL 97* 100 99 101 97* 97*  CO2 24 21* 23 22 27 25   GLUCOSE 140* 124* 102* 82 120* 83  BUN 38* 51* 32* 39* 17 23*  CREATININE 9.77* 11.59* 8.66* 10.76* 6.70* 8.44*  CALCIUM 8.5* 8.2* 7.9* 8.0* 7.9* 7.7*  MG 2.1  --   --   --   --   --   PHOS 5.3* 6.0* 4.6 4.7* 2.8 3.7    GFR: Estimated Creatinine Clearance: 12 mL/min (A) (by C-G formula based on SCr of 8.44 mg/dL (H)).  Liver Function Tests: Recent Labs  Lab 01/05/23 1717 01/06/23 0657 01/07/23 0712 01/08/23 0735 01/09/23 0911 01/09/23 1503 01/10/23 0720  AST 15 12*  --   --   --   --   --   ALT 16 14  --   --   --   --   --   ALKPHOS 47 33*  --   --   --   --   --   BILITOT 0.8 0.4  --   --   --   --   --   PROT 7.4 6.3*  --   --   --   --   --   ALBUMIN 3.9 3.2* 2.9* 2.9* 2.9* 3.1* 2.9*    CBG: Recent Labs  Lab 01/10/23 0058 01/10/23 0615 01/10/23 0802 01/10/23 1131 01/10/23 1642  GLUCAP 113* 106* 81 101* 96     Recent Results (from the past 240 hour(s))  Culture, blood (Routine x 2)     Status: None   Collection Time: 01/05/23  5:18 PM   Specimen: BLOOD  Result Value Ref Range Status   Specimen Description BLOOD SITE NOT SPECIFIED  Final   Special Requests   Final    BOTTLES DRAWN AEROBIC AND ANAEROBIC Blood Culture results may not be optimal due to an inadequate volume of blood received in culture bottles   Culture   Final    NO GROWTH 5 DAYS Performed at [facility], [address], [place], [place] [address]    Report Status 01/10/2023 FINAL  Final  Culture, blood (Routine x 2)     Status: None   Collection Time: 01/05/23  7:03 PM   Specimen: BLOOD  Result Value Ref Range Status   Specimen Description BLOOD SITE NOT SPECIFIED  Final   Special Requests   Final     BOTTLES DRAWN AEROBIC AND ANAEROBIC Blood Culture adequate volume   Culture   Final    NO GROWTH 5 DAYS Performed at [facility], [address], [place], [place] [address]    Report Status 01/10/2023 FINAL  Final  SARS Coronavirus 2 by RT PCR (hospital order, performed in [facility] hospital lab) *cepheid single result test* Anterior Nasal Swab     Status: None   Collection Time: 01/05/23  8:01 PM   Specimen: Anterior Nasal Swab  Result Value Ref Range Status   SARS Coronavirus 2 by RT PCR NEGATIVE NEGATIVE Final    Comment: Performed at [facility], [address], [place], [place] [address]  Expectorated Sputum Assessment w Gram Stain, Rflx to Resp Cult     Status: None   Collection Time: 01/06/23 12:50 AM   Specimen: Expectorated Sputum  Result Value Ref Range Status   Specimen Description EXPECTORATED SPUTUM  Final   Special Requests NONE  Final   Sputum evaluation   Final    THIS SPECIMEN IS ACCEPTABLE FOR SPUTUM CULTURE Performed at [facility], [address] [place], [place] [address]    Report Status 01/06/2023 FINAL  Final  Culture, Respiratory w Gram Stain     Status: None   Collection Time: 01/06/23 12:50 AM  Result Value Ref Range Status   Specimen Description EXPECTORATED SPUTUM  Final   Special Requests NONE Reflexed from [id number]  Final   Gram Stain   Final    ABUNDANT WBC PRESENT,BOTH PMN AND MONONUCLEAR FEW GRAM VARIABLE ROD FEW GRAM POSITIVE COCCI IN PAIRS IN SINGLES    Culture   Final    Normal respiratory flora-no Staph aureus or Pseudomonas seen Performed at [facility], [address] [place], [place] [address]    Report Status 01/09/2023 FINAL  Final         Radiology Studies: No results found.      Scheduled Meds:  (feeding supplement) PROSource Plus  30 mL Oral BID BM   calcitRIOL  0.5 mcg Oral Daily   Chlorhexidine Gluconate Cloth  6 each Topical Q0600   [START ON 01/11/2023] Chlorhexidine Gluconate Cloth   6 each Topical Q0600   darbepoetin (ARANESP) injection - DIALYSIS  60 mcg Subcutaneous Q Fri-1800   feeding supplement  237 mL Oral TID BM   guaiFENesin  1,200 mg Oral BID   insulin aspart  0-9 Units Subcutaneous TID WC   metoCLOPramide (REGLAN) injection  10 mg Intravenous Q6H   metoprolol tartrate  7.5 mg Intravenous Q8H   pantoprazole  40 mg Oral BID   sacubitril-valsartan  1 tablet Oral BID   scopolamine  1 patch Transdermal Q72H   sodium chloride flush  3 mL Intravenous Q12H   Continuous Infusions:  sodium chloride     cefTRIAXone (ROCEPHIN)  IV 2 g (01/09/23 2000)     LOS: 4 days    Time spent: 40 minutes    Ramiro Harvest, MD [facility]   To contact the attending provider between 7A-7P or the covering provider during after hours 7P-7A, please log into the web site [email or web address] and access using universal [place] password for that web site. If you do not have the password, please call the hospital operator.  01/10/2023,  5:25 PM

## 2023-01-10 NOTE — Progress Notes (Signed)
Pt receives home HD through [facility] prior to admission. Case discussed with nephrologist. Contacted inpt HD unit to request pt receive inpt HD tomorrow on 2nd shift in the event pt stable for d/c tomorrow and pt able to do HD treatment at home after d/c. Will assist as needed.   Olivia Canter Renal Navigator [phone]

## 2023-01-10 NOTE — Progress Notes (Signed)
Admit: 01/05/2023 LOS: 4  36F ESRD on HHD 4x/wk with CAP, N/V  Subjective - had HD yesterday morning and got off 2.5 L . Pt is down to 88kg post HD yesterday.   Physical Exam:  Blood pressure (!) 143/88, pulse 92, temperature 98.2 F (36.8 C), temperature source Axillary, resp. rate 20, height 5\' 9"  (1.753 m), weight 88.3 kg, SpO2 99%. NAD Regular, normal S1 and S2 Diminished in the bases, otherwise clear bilaterally No significant edema Right forearm AVF with bruit and thrill Nonfocal, CN II through XII grossly intact Soft, nontender  Dialysis Orders: NxStage HHD - MTThF. Follows at [facility], phone: [phone]  3h   1K bath  91kg  AVF  Heparin 6000 bolus  - mircera q month, last given 8/15 --> due 9/15 approx  Assessment/ Plan HCAP - on ceftriaxone and azithromycin per TRH, slowly improving Reduced LVEF - cardiology following and adjusting medications including initiation of Entresto, beta-blocker ESRD - on HHD MTThF using AVF; lives locally but followed by [facility] nephrology. Had dialysis here Sat and Monday. Next HD tomorrow.  N/V - persistent nausea and vomiting, improving, poss gastroparesis Volume / BP - BP's stable, about 5 kg under dry wt after last HD yesterday. Small UF goal w/ HD tomorrow. Euvolemic on exam.  Anemia of ESRD: On ESA q. 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  Vinson Moselle  MD  [facility] 01/10/2023, 3:06 PM  Recent Labs  Lab 01/09/23 1503 01/10/23 0720  HGB 11.9* 10.9*  ALBUMIN 3.1* 2.9*  CALCIUM 7.9* 7.7*  PHOS 2.8 3.7  CREATININE 6.70* 8.44*  K 3.7 4.3    Inpatient medications:  (feeding supplement) PROSource Plus  30 mL Oral BID BM   calcitRIOL  0.5 mcg Oral Daily   Chlorhexidine Gluconate Cloth  6 each Topical Q0600   darbepoetin (ARANESP) injection - DIALYSIS  60 mcg Subcutaneous Q Fri-1800   feeding supplement  237 mL Oral TID BM   guaiFENesin  1,200 mg Oral  BID   insulin aspart  0-9 Units Subcutaneous TID WC   metoCLOPramide (REGLAN) injection  10 mg Intravenous Q6H   metoprolol tartrate  7.5 mg Intravenous Q8H   pantoprazole  40 mg Oral BID   sacubitril-valsartan  1 tablet Oral BID   scopolamine  1 patch Transdermal Q72H   sodium chloride flush  3 mL Intravenous Q12H    sodium chloride     cefTRIAXone (ROCEPHIN)  IV 2 g (01/09/23 2000)   sodium chloride, acetaminophen **OR** acetaminophen, albuterol, alum & mag hydroxide-simeth, HYDROcodone bit-homatropine, HYDROcodone-acetaminophen, prochlorperazine, sodium chloride flush

## 2023-01-10 NOTE — Plan of Care (Signed)

## 2023-01-11 DIAGNOSIS — J189 Pneumonia, unspecified organism: Secondary | ICD-10-CM | POA: Diagnosis not present

## 2023-01-11 LAB — GLUCOSE, CAPILLARY
Glucose-Capillary: 109 mg/dL — ABNORMAL HIGH (ref 70–99)
Glucose-Capillary: 112 mg/dL — ABNORMAL HIGH (ref 70–99)
Glucose-Capillary: 151 mg/dL — ABNORMAL HIGH (ref 70–99)
Glucose-Capillary: 156 mg/dL — ABNORMAL HIGH (ref 70–99)
Glucose-Capillary: 158 mg/dL — ABNORMAL HIGH (ref 70–99)
Glucose-Capillary: 95 mg/dL (ref 70–99)

## 2023-01-11 MED ORDER — CARVEDILOL 25 MG PO TABS
25.0000 mg | ORAL_TABLET | Freq: Two times a day (BID) | ORAL | Status: DC
  Administered 2023-01-11 – 2023-01-12 (×3): 25 mg via ORAL
  Filled 2023-01-11 (×3): qty 1

## 2023-01-11 MED ORDER — HEPARIN SODIUM (PORCINE) 1000 UNIT/ML DIALYSIS: 4000.0000 [IU] | Freq: Once | INTRAMUSCULAR | Status: DC

## 2023-01-11 MED ORDER — CARVEDILOL 12.5 MG PO TABS: 12.5000 mg | ORAL_TABLET | Freq: Two times a day (BID) | ORAL | Status: DC

## 2023-01-11 MED ORDER — LIDOCAINE-PRILOCAINE 2.5-2.5 % EX CREA: 1.0000 | TOPICAL_CREAM | CUTANEOUS | Status: DC | PRN

## 2023-01-11 MED ORDER — SODIUM CHLORIDE 0.9 % IV SOLN: INTRAVENOUS | Status: AC

## 2023-01-11 MED ORDER — HEPARIN SODIUM (PORCINE) 1000 UNIT/ML IJ SOLN
4000.0000 [IU] | Freq: Once | INTRAMUSCULAR | Status: AC
  Administered 2023-01-11: 4000 [IU] via INTRAVENOUS
  Filled 2023-01-11: qty 4

## 2023-01-11 MED ORDER — PENTAFLUOROPROP-TETRAFLUOROETH EX AERO: 1.0000 | INHALATION_SPRAY | CUTANEOUS | Status: DC | PRN

## 2023-01-11 MED ORDER — ALTEPLASE 2 MG IJ SOLR: 2.0000 mg | Freq: Once | INTRAMUSCULAR | Status: DC | PRN

## 2023-01-11 MED ORDER — ANTICOAGULANT SODIUM CITRATE 4% (200MG/5ML) IV SOLN: 5.0000 mL | Status: DC | PRN

## 2023-01-11 MED ORDER — HEPARIN SODIUM (PORCINE) 1000 UNIT/ML IJ SOLN
2000.0000 [IU] | Freq: Once | INTRAMUSCULAR | Status: AC
  Administered 2023-01-11: 2000 [IU] via INTRAVENOUS

## 2023-01-11 MED ORDER — NEPRO/CARBSTEADY PO LIQD: 237.0000 mL | ORAL | Status: DC | PRN

## 2023-01-11 MED ORDER — HEPARIN SODIUM (PORCINE) 1000 UNIT/ML DIALYSIS: 2000.0000 [IU] | INTRAMUSCULAR | Status: DC | PRN

## 2023-01-11 MED ORDER — SODIUM CHLORIDE 0.9 % IV BOLUS
1500.0000 mL | Freq: Once | INTRAVENOUS | Status: AC
  Administered 2023-01-11: 1500 mL via INTRAVENOUS

## 2023-01-11 MED ORDER — LIDOCAINE HCL (PF) 1 % IJ SOLN: 5.0000 mL | INTRAMUSCULAR | Status: DC | PRN

## 2023-01-11 MED ORDER — HEPARIN SODIUM (PORCINE) 1000 UNIT/ML DIALYSIS: 1000.0000 [IU] | INTRAMUSCULAR | Status: DC | PRN

## 2023-01-11 NOTE — Plan of Care (Signed)

## 2023-01-11 NOTE — Progress Notes (Signed)
Admit: 01/05/2023 LOS: 5  35F ESRD on HHD 4x/wk with CAP, N/V  Subjective - had HD yesterday morning and got off 2.5 L . Pt is down to 88kg post HD yesterday.   Physical Exam:  Blood pressure (!) 160/89, pulse 78, temperature 98.6 F (37 C), temperature source Oral, resp. rate 17, height 5\' 9"  (1.753 m), weight 88.3 kg, SpO2 99%. NAD Regular, normal S1 and S2 Diminished in the bases, otherwise clear bilaterally No significant edema Right forearm AVF with bruit and thrill Nonfocal, CN II through XII grossly intact Soft, nontender  Dialysis Orders: NxStage HHD - MTThF. Follows at [facility], phone: [phone]  3h   1K bath  91kg  AVF  Heparin 6000 bolus  - mircera q month, last given 8/15 --> due 9/15 approx  CXR 9/12 - RLL infiltrate, no edema   Assessment/ Plan HCAP - on ceftriaxone and azithromycin per TRH, slowly improving Reduced LVEF - cardiology following and adjusting medications including initiation of Entresto, beta-blocker ESRD - on HHD MTThF using AVF; lives locally but followed by [facility] nephrology. Had dialysis here Sat and Monday. Next HD tomorrow.  N/V/ orthostatic symptoms - may be dry. Standing wt this am shows he is 4-5 kg under dry wt, which makes sense w/ his admitting symptoms. Will give bolus NS and also 0.9% NS at 75 cc/hr overnight, follow symptoms.   Volume - probably dry, will keep even w/ HD today. HTN - cont entresto and metoprolol, BP's okay  Anemia of ESRD: On ESA q. 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  Tim Moselle  MD  [facility] 01/11/2023, 11:05 AM  Recent Labs  Lab 01/10/23 0720 01/11/23 0738  HGB 10.9* 10.9*  ALBUMIN 2.9* 3.0*  CALCIUM 7.7* 8.1*  PHOS 3.7 4.7*  CREATININE 8.44* 10.47*  K 4.3 4.0    Inpatient medications:  (feeding supplement) PROSource Plus  30 mL Oral BID BM   calcitRIOL  0.5 mcg Oral Daily   Chlorhexidine Gluconate Cloth  6 each Topical  Q0600   Chlorhexidine Gluconate Cloth  6 each Topical Q0600   darbepoetin (ARANESP) injection - DIALYSIS  60 mcg Subcutaneous Q Fri-1800   feeding supplement  237 mL Oral TID BM   guaiFENesin  1,200 mg Oral BID   [START ON 01/12/2023] heparin  4,000 Units Dialysis Once in dialysis   insulin aspart  0-9 Units Subcutaneous TID WC   metoCLOPramide (REGLAN) injection  10 mg Intravenous Q6H   metoprolol tartrate  7.5 mg Intravenous Q8H   pantoprazole  40 mg Oral BID   sacubitril-valsartan  1 tablet Oral BID   scopolamine  1 patch Transdermal Q72H   sodium chloride flush  3 mL Intravenous Q12H    sodium chloride     anticoagulant sodium citrate     cefTRIAXone (ROCEPHIN)  IV 2 g (01/10/23 2034)   sodium chloride, acetaminophen **OR** acetaminophen, albuterol, alteplase, alum & mag hydroxide-simeth, anticoagulant sodium citrate, feeding supplement (NEPRO CARB STEADY), heparin, [START ON 01/12/2023] heparin, HYDROcodone bit-homatropine, HYDROcodone-acetaminophen, lidocaine (PF), lidocaine-prilocaine, pentafluoroprop-tetrafluoroeth, prochlorperazine, sodium chloride flush

## 2023-01-11 NOTE — TOC Progression Note (Signed)
Transition of Care [facility]) - Progression Note    Patient Details  Name: [name] MRN: [id number] Date of Birth: 1976/02/06  [facility] CM/SW Contact  Lawerance Sabal, RN Phone Number: 01/11/2023, 8:12 AM  Clinical Narrative:     Referral placed by provider for OP PT. No DME needs identified.   Expected Discharge Plan: Home/Self Care Barriers to Discharge: Continued Medical Work up  Expected Discharge Plan and Services   Discharge Planning Services: CM Consult   Living arrangements for the past 2 months: Single Family Home                                       Social Determinants of Health (SDOH) Interventions SDOH Screenings   Food Insecurity: No Food Insecurity (01/06/2023)  Housing: Low Risk  (01/06/2023)  Transportation Needs: No Transportation Needs (01/06/2023)  Utilities: Not At Risk (01/06/2023)  Financial Resource Strain: Low Risk  (10/30/2019)   Received from [facility], [facility]  Physical Activity: Insufficiently Active (10/30/2019)   Received from [facility], [facility]  Social Connections: Moderately Isolated (11/16/2018)   Received from [facility], [facility]  Stress: No Stress Concern Present (11/16/2018)   Received from [facility], [facility]  Tobacco Use: Low Risk  (01/05/2023)    Readmission Risk Interventions     No data to display

## 2023-01-11 NOTE — Progress Notes (Signed)
Received patient in bed to unit.  Alert and oriented.  Informed consent signed and in chart.   TX duration: 3 hours  Patient tolerated well.  Transported back to the room  Alert, without acute distress.  Hand-off given to patient's nurse.   Access used: R FA Fistula Access issues: none  Total UF removed: 0 mL Medication(s) given: none   01/11/23 1803  Vitals  Temp 97.7 F (36.5 C)  Temp Source Oral  BP (!) 172/102  MAP (mmHg) 125  BP Location Left Arm  BP Method Automatic  Patient Position (if appropriate) Lying  Pulse Rate Source Monitor  ECG Heart Rate 85  Resp 14  Oxygen Therapy  SpO2 100 %  O2 Device Room Air  During Treatment Monitoring  HD Safety Checks Performed Yes  Intra-Hemodialysis Comments Tx completed;Tolerated well  Dialysis Fluid Bolus Normal Saline  Bolus Amount (mL) 300 mL  Fistula / Graft Right Forearm Arteriovenous fistula  Placement Date/Time: 01/06/23 0026   Placed prior to admission: No  Orientation: Right  Access Location: Forearm  Access Type: Arteriovenous fistula  Site Condition No complications  Status Deaccessed     Stacie Glaze LPN [facility] Unit

## 2023-01-11 NOTE — Progress Notes (Signed)
Physical Therapy Treatment Patient Details Name: TYLER BLECH MRN: [id number] DOB: [birth year] Today's Date: 01/11/2023   History of Present Illness Patient 47 year old gentleman presented to the ED 9/12 with 3-day history of fever of 101.2, nausea vomiting, productive cough of clear sputum and noted on chest x-ray and CT abdomen and pelvis to have a right middle lobe pneumonia.  COVID-19 PCR negative. PMH: ESRD on dialysis TTS, history of hypertension, diabetes mellitus type 2, gastroparesis    PT Comments  Pt greeted resting in bed and agreeable to session with continued progress towards acute goals. Pt able to progress gait distance significantly this session, demonstrating gait with and without RW support. Pt with guarded gait without RW support, however no overt LOB noted, encouraged pt to utilize RW PRN post acutely for increased stability and to decrease risk for falls when experiencing increased dizziness. Pt continues to benefit from skilled PT services to progress toward functional mobility goals.      If plan is discharge home, recommend the following: A little help with walking and/or transfers;A little help with bathing/dressing/bathroom;Assistance with cooking/housework;Help with stairs or ramp for entrance;Assist for transportation   Can travel by private vehicle        Equipment Recommendations  None recommended by PT    Recommendations for Other Services       Precautions / Restrictions Precautions Precautions: Fall Precaution Comments: low vision Restrictions Weight Bearing Restrictions: No     Mobility  Bed Mobility Overal bed mobility: Needs Assistance Bed Mobility: Supine to Sit, Sit to Supine     Supine to sit: Supervision Sit to supine: Supervision   General bed mobility comments: supervision for safety    Transfers Overall transfer level: Needs assistance Equipment used: Rolling walker (2 wheels), None Transfers: Sit to/from Stand Sit to Stand:  Contact guard assist           General transfer comment: steady rise to RW    Ambulation/Gait Ambulation/Gait assistance: Supervision, Contact guard assist Gait Distance (Feet): 700 Feet Assistive device: Rolling walker (2 wheels), None Gait Pattern/deviations: Step-through pattern Gait velocity: decr     General Gait Details: steady gait with RW support, no LOB, progressing to no AD, no overt LOB, some slight drift inhall but suspect due to low vision, guarded initially with zero reciprocal arm swing, improving with distance and cues   Stairs             Wheelchair Mobility     Tilt Bed    Modified Rankin (Stroke Patients Only)       Balance Overall balance assessment: Needs assistance Sitting-balance support: Feet supported Sitting balance-Leahy Scale: Good     Standing balance support: Single extremity supported, During functional activity Standing balance-Leahy Scale: Fair                              Cognition Arousal: Alert Behavior During Therapy: WFL for tasks assessed/performed Overall Cognitive Status: Within Functional Limits for tasks assessed                                 General Comments: limited insight on how vision deficits impact safety        Exercises      General Comments General comments (skin integrity, edema, etc.): VSS on RA, mild dizziness initally, improving but not resolved at end of session  Pertinent Vitals/Pain Pain Assessment Pain Assessment: No/denies pain Pain Intervention(s): Monitored during session    Home Living                          Prior Function            PT Goals (current goals can now be found in the care plan section) Acute Rehab PT Goals Patient Stated Goal: to get rid of nausea PT Goal Formulation: With patient Time For Goal Achievement: 01/23/23 Progress towards PT goals: Progressing toward goals    Frequency    Min 1X/week      PT  Plan      Co-evaluation              AM-PAC PT "6 Clicks" Mobility   Outcome Measure  Help needed turning from your back to your side while in a flat bed without using bedrails?: None Help needed moving from lying on your back to sitting on the side of a flat bed without using bedrails?: A Little Help needed moving to and from a bed to a chair (including a wheelchair)?: A Little Help needed standing up from a chair using your arms (e.g., wheelchair or bedside chair)?: A Little Help needed to walk in hospital room?: A Little Help needed climbing 3-5 steps with a railing? : A Lot 6 Click Score: 18    End of Session Equipment Utilized During Treatment: Gait belt Activity Tolerance: Patient tolerated treatment well Patient left: in bed;with call bell/phone within reach Nurse Communication: Mobility status PT Visit Diagnosis: Muscle weakness (generalized) (M62.81)     Time: 1610-9604 PT Time Calculation (min) (ACUTE ONLY): 19 min  Charges:    $Gait Training: 8-22 mins PT General Charges $$ ACUTE PT VISIT: 1 Visit                     Tobi Bastos R. PTA Acute Rehabilitation Services Office: [phone]   Catalina Antigua 01/11/2023, 11:51 AM

## 2023-01-11 NOTE — Progress Notes (Signed)
Pt's case discussed with nephrologist this morning. Pt is not stable for d/c today and will require inpt HD today. Inpt HD unit made aware of this info.   Olivia Canter Renal Navigator [phone]

## 2023-01-11 NOTE — Progress Notes (Signed)
PROGRESS NOTE  [name]  HKV:[id number] DOB: 09-18-1975 DOA: 01/05/2023 PCP: Starla Link, MD   Brief Narrative: Patient is a 47 year old male with history of ESRD on dialysis on TTS schedule, hypertension, diabetes type 2, gastroparesis, right-handed admitted with bilateral peripheral vision loss  who presented with 3-day history of fever, nausea, vomiting, cough.  Chest imaging showed right middle lobe pneumonia.  Started on antibiotics.  Echo showed EF of 40 to 45% with global hypokinesis.  Cardiology, nephrology consulted.  Assessment & Plan:  Principal Problem:   CAP (community acquired pneumonia) Active Problems:   Nausea & vomiting   Elevated troponin   ESRD (end stage renal disease) (HCC)   Abnormal echocardiogram   Hypertension   Anemia of chronic disease   Type 2 diabetes mellitus without complication, without long-term current use of insulin (HCC)   Abnormal TSH   Gastroparesis   Dilated cardiomyopathy (HCC)  Right middle lobe pneumonia: Presented with cough, fever, nausea, vomiting.  Chest imaging showed right middle lobe pneumonia.  COVID screen test negative.  Sputum culture showed normal respiratory flora.  Blood cultures have been negative.  Urine Legionella/pneumococcal antigen negative.  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  Cardiology was consulted and following.  Medication optimized.  He will follow-up with cardiology as an outpatient.  Recommending repeat echo in 3 months . He has been started on Entresto.  Due to nausea and vomiting, patient was put on IV metoprolol,will change back to coreg  Nausea/vomiting/history of gastroparesis: On Reglan.  CT abdomen/pelvis showed urinary bladder wall thickening.  Placed on Reglan.  On scopolamine, IV Compazine PT recommend outpatient vestibular  therapy  ESRD on dialysis: Nephrology following for dialysis.  Patient had orthostatic symptoms and was given IV fluids.Being dialysed today  Hypertension: On Norvasc, Procardia at home, currently discontinued.  Currently on Coreg, Entresto  Normocytic anemia: Secondary to ESRD, stable  Well-controlled diabetes type 2: A1c of 5.3.  Currently on sliding scale  Abnormal TSH: TSH of 16.  Free T4 normal.  Outpatient follow-up with repeat testing in 4-6 weeks        Nutrition Problem: Increased nutrient needs Etiology: chronic illness    DVT prophylaxis:Place and maintain sequential compression device Start: 01/06/23 1653 SCDs Start: 01/06/23 0049     Code Status: Full Code  Family Communication: None at bedside  Patient status:Inpatient  Patient is from :Home  Anticipated discharge FI:[facility]  Estimated DC date:1-2 days   Consultants: nephrology,cardiology  Procedures:Dialysis  Antimicrobials:  Anti-infectives (From admission, onward)    Start     Dose/Rate Route Frequency Ordered Stop   01/06/23 2200  azithromycin (ZITHROMAX) tablet 500 mg  Status:  Discontinued        500 mg Oral Daily 01/06/23 1126 01/09/23 0758   01/06/23 2000  cefTRIAXone (ROCEPHIN) 2 g in sodium chloride 0.9 % 100 mL IVPB        2 g 200 mL/hr over 30 Minutes Intravenous Every 24 hours 01/06/23 0903     01/05/23 2015  azithromycin (ZITHROMAX) 500 mg in sodium chloride 0.9 % 250 mL IVPB  Status:  Discontinued        500 mg 250 mL/hr over 60 Minutes Intravenous Every 24 hours 01/05/23 2014 01/06/23 1126   01/05/23 2015  cefTRIAXone (ROCEPHIN) 1 g in sodium chloride 0.9 % 100 mL IVPB  Status:  Discontinued  1 g 200 mL/hr over 30 Minutes Intravenous Every 24 hours 01/05/23 2014 01/06/23 1610       Subjective: Patient seen and examined the bedside today.  Comfortably sitting on the bed.  He was about to go for dialysis.  He is tolerating solid diet at present.  He feels nauseous when he  lies down.  On room air.  Denies any shortness of breath or cough.  Objective: Vitals:   01/11/23 0014 01/11/23 0501 01/11/23 0724 01/11/23 1130  BP: (!) 143/83 (!) 154/89 (!) 160/89   Pulse: 79 79 78   Resp: 17 18 17    Temp: 98.2 F (36.8 C) 98.2 F (36.8 C) 98.6 F (37 C)   TempSrc: Oral Oral Oral   SpO2: 100% 99% 99%   Weight:    86.5 kg  Height:        Intake/Output Summary (Last 24 hours) at 01/11/2023 1333 Last data filed at 01/11/2023 0900 Gross per 24 hour  Intake 240 ml  Output --  Net 240 ml   Filed Weights   01/09/23 0400 01/09/23 0900 01/11/23 1130  Weight: 89.9 kg 88.3 kg 86.5 kg    Examination:  General exam: Overall comfortable, not in distress HEENT: PERRL Respiratory system:  no wheezes or crackles  Cardiovascular system: S1 & S2 heard, RRR.  Gastrointestinal system: Abdomen is nondistended, soft and nontender. Central nervous system: Alert and oriented Extremities: No edema, no clubbing ,no cyanosis, AV fistula on the right upper extremity Skin: No rashes, no ulcers,no icterus     Data Reviewed: I have personally reviewed following labs and imaging studies  CBC: Recent Labs  Lab 01/05/23 1717 01/06/23 0657 01/07/23 0712 01/08/23 0735 01/09/23 0911 01/09/23 1503 01/10/23 0720 01/11/23 0738  WBC 5.6   < > 6.8 5.0 5.3 5.3 4.3 5.4  NEUTROABS 4.6  --  5.2 3.2  --   --   --   --   HGB 10.1*   < > 9.7* 11.4* 11.1* 11.9* 10.9* 10.9*  HCT 32.7*   < > 30.9* 36.4* 35.3* 36.8* 32.9* 35.1*  MCV 92.9   < > 90.9 90.5 92.2 94.4 96.2 93.4  PLT 125*   < > 122* 134* 164 155 168 191   < > = values in this interval not displayed.   Basic Metabolic Panel: Recent Labs  Lab 01/06/23 0657 01/07/23 0712 01/08/23 0735 01/09/23 0911 01/09/23 1503 01/10/23 0720 01/11/23 0738  NA 133*   < > 135 138 135 133* 135  K 3.8   < > 3.6 3.3* 3.7 4.3 4.0  CL 97*   < > 99 101 97* 97* 98  CO2 24   < > 23 22 27 25 23   GLUCOSE 140*   < > 102* 82 120* 83 96  BUN 38*    < > 32* 39* 17 23* 30*  CREATININE 9.77*   < > 8.66* 10.76* 6.70* 8.44* 10.47*  CALCIUM 8.5*   < > 7.9* 8.0* 7.9* 7.7* 8.1*  MG 2.1  --   --   --   --   --   --   PHOS 5.3*   < > 4.6 4.7* 2.8 3.7 4.7*   < > = values in this interval not displayed.     Recent Results (from the past 240 hour(s))  Culture, blood (Routine x 2)     Status: None   Collection Time: 01/05/23  5:18 PM   Specimen: BLOOD  Result Value Ref Range Status  Specimen Description BLOOD SITE NOT SPECIFIED  Final   Special Requests   Final    BOTTLES DRAWN AEROBIC AND ANAEROBIC Blood Culture results may not be optimal due to an inadequate volume of blood received in culture bottles   Culture   Final    NO GROWTH 5 DAYS Performed at [facility], [address], [place], [place] [address]    Report Status 01/10/2023 FINAL  Final  Culture, blood (Routine x 2)     Status: None   Collection Time: 01/05/23  7:03 PM   Specimen: BLOOD  Result Value Ref Range Status   Specimen Description BLOOD SITE NOT SPECIFIED  Final   Special Requests   Final    BOTTLES DRAWN AEROBIC AND ANAEROBIC Blood Culture adequate volume   Culture   Final    NO GROWTH 5 DAYS Performed at [facility], [address] [place], [place] [address]    Report Status 01/10/2023 FINAL  Final  SARS Coronavirus 2 by RT PCR (hospital order, performed in [facility] hospital lab) *cepheid single result test* Anterior Nasal Swab     Status: None   Collection Time: 01/05/23  8:01 PM   Specimen: Anterior Nasal Swab  Result Value Ref Range Status   SARS Coronavirus 2 by RT PCR NEGATIVE NEGATIVE Final    Comment: Performed at [facility], [address] [place], [place] [address]  Expectorated Sputum Assessment w Gram Stain, Rflx to Resp Cult     Status: None   Collection Time: 01/06/23 12:50 AM   Specimen: Expectorated Sputum  Result Value Ref Range Status   Specimen Description EXPECTORATED SPUTUM  Final   Special Requests NONE   Final   Sputum evaluation   Final    THIS SPECIMEN IS ACCEPTABLE FOR SPUTUM CULTURE Performed at [facility], [address], [place], [place] [address]    Report Status 01/06/2023 FINAL  Final  Culture, Respiratory w Gram Stain     Status: None   Collection Time: 01/06/23 12:50 AM  Result Value Ref Range Status   Specimen Description EXPECTORATED SPUTUM  Final   Special Requests NONE Reflexed from [id number]  Final   Gram Stain   Final    ABUNDANT WBC PRESENT,BOTH PMN AND MONONUCLEAR FEW GRAM VARIABLE ROD FEW GRAM POSITIVE COCCI IN PAIRS IN SINGLES    Culture   Final    Normal respiratory flora-no Staph aureus or Pseudomonas seen Performed at [facility], [address], [place], [place] [address]    Report Status 01/09/2023 FINAL  Final     Radiology Studies: No results found.  Scheduled Meds:  (feeding supplement) PROSource Plus  30 mL Oral BID BM   calcitRIOL  0.5 mcg Oral Daily   Chlorhexidine Gluconate Cloth  6 each Topical Q0600   Chlorhexidine Gluconate Cloth  6 each Topical Q0600   darbepoetin (ARANESP) injection - DIALYSIS  60 mcg Subcutaneous Q Fri-1800   feeding supplement  237 mL Oral TID BM   guaiFENesin  1,200 mg Oral BID   [START ON 01/12/2023] heparin  4,000 Units Dialysis Once in dialysis   insulin aspart  0-9 Units Subcutaneous TID WC   metoCLOPramide (REGLAN) injection  10 mg Intravenous Q6H   metoprolol tartrate  7.5 mg Intravenous Q8H   pantoprazole  40 mg Oral BID   sacubitril-valsartan  1 tablet Oral BID   scopolamine  1 patch Transdermal Q72H   sodium chloride flush  3 mL Intravenous Q12H   Continuous Infusions:  sodium chloride     sodium chloride     anticoagulant sodium citrate     cefTRIAXone (ROCEPHIN)  IV 2 g (01/10/23 2034)     LOS: 5 days   Burnadette Pop, MD [facility] P9/18/2024, 1:33 PM

## 2023-01-12 ENCOUNTER — Other Ambulatory Visit (HOSPITAL_COMMUNITY): Payer: Self-pay

## 2023-01-12 DIAGNOSIS — J189 Pneumonia, unspecified organism: Secondary | ICD-10-CM | POA: Diagnosis not present

## 2023-01-12 LAB — GLUCOSE, CAPILLARY
Glucose-Capillary: 107 mg/dL — ABNORMAL HIGH (ref 70–99)
Glucose-Capillary: 106 mg/dL — ABNORMAL HIGH (ref 70–99)

## 2023-01-12 MED ORDER — METOCLOPRAMIDE HCL 5 MG PO TABS
5.0000 mg | ORAL_TABLET | Freq: Four times a day (QID) | ORAL | 0 refills | Status: AC | PRN
  Filled 2023-01-12: qty 30, 8d supply, fill #0

## 2023-01-12 MED ORDER — SACUBITRIL-VALSARTAN 24-26 MG PO TABS
1.0000 | ORAL_TABLET | Freq: Two times a day (BID) | ORAL | 0 refills | Status: DC
Start: 2023-01-12 — End: 2023-05-10
  Filled 2023-01-12: qty 60, 30d supply, fill #0

## 2023-01-12 MED ORDER — CARVEDILOL 25 MG PO TABS
25.0000 mg | ORAL_TABLET | Freq: Two times a day (BID) | ORAL | 0 refills | Status: DC
  Filled 2023-01-12: qty 120, 60d supply, fill #0

## 2023-01-12 NOTE — Progress Notes (Addendum)
Progress Note  Patient Name: ROCIO ROME Date of Encounter: 01/12/2023  Primary Cardiologist: Saw cardiology at [facility], but will be local here  Subjective   No CP, SOB. Feeling good. Hopeful for DC. We discussed f/u plan. He confirms he had a cath at [facility] in July with possible question of mild valve issue but no significant blockages. Was able to locate report, see below. This was in prep for ongoing renal transplantation consideration. He would like to have local cardiology follow-up.  Inpatient Medications    Scheduled Meds:  (feeding supplement) PROSource Plus  30 mL Oral BID BM   calcitRIOL  0.5 mcg Oral Daily   carvedilol  25 mg Oral BID WC   Chlorhexidine Gluconate Cloth  6 each Topical Q0600   Chlorhexidine Gluconate Cloth  6 each Topical Q0600   darbepoetin (ARANESP) injection - DIALYSIS  60 mcg Subcutaneous Q Fri-1800   feeding supplement  237 mL Oral TID BM   guaiFENesin  1,200 mg Oral BID   insulin aspart  0-9 Units Subcutaneous TID WC   metoCLOPramide (REGLAN) injection  10 mg Intravenous Q6H   pantoprazole  40 mg Oral BID   sacubitril-valsartan  1 tablet Oral BID   scopolamine  1 patch Transdermal Q72H   sodium chloride flush  3 mL Intravenous Q12H   Continuous Infusions:  sodium chloride     PRN Meds: sodium chloride, acetaminophen **OR** acetaminophen, albuterol, alum & mag hydroxide-simeth, HYDROcodone bit-homatropine, HYDROcodone-acetaminophen, prochlorperazine, sodium chloride flush   Vital Signs    Vitals:   01/11/23 1805 01/11/23 1823 01/11/23 2101 01/12/23 0800  BP: (!) 172/92  138/77   Pulse: 86  93   Resp: 13  18 17   Temp:   98.8 F (37.1 C) 98.7 F (37.1 C)  TempSrc:   Oral Oral  SpO2: 100%  99% 95%  Weight:  86.5 kg    Height:        Intake/Output Summary (Last 24 hours) at 01/12/2023 1201 Last data filed at 01/12/2023 0903 Gross per 24 hour  Intake 543 ml  Output 0 ml  Net 543 ml      01/11/2023    6:23 PM 01/11/2023    2:40 PM  01/11/2023   11:30 AM  Last 3 Weights  Weight (lbs) 190 lb 11.2 oz 190 lb 11.2 oz 190 lb 11.2 oz  Weight (kg) 86.5 kg 86.5 kg 86.501 kg     Telemetry    NSR - Personally Reviewed  Physical Exam   GEN: No acute distress.  HEENT: Normocephalic, atraumatic, sclera non-icteric. Neck: No JVD or bruits. Cardiac: RRR no murmurs, rubs, or gallops.  Respiratory: Clear to auscultation bilaterally. Breathing is unlabored. GI: Soft, nontender, non-distended, BS +x 4. MS: no deformity. Extremities: No clubbing or cyanosis. No edema. Distal pedal pulses are 2+ and equal bilaterally. Neuro:  AAOx3. Follows commands. Psych:  Responds to questions appropriately with a normal affect.  Labs    High Sensitivity Troponin:   Recent Labs  Lab 01/05/23 1717 01/05/23 1903 01/06/23 0653 01/06/23 0657  TROPONINIHS 44* 59* 50* 49*      Cardiac EnzymesNo results for input(s): "TROPONINI" in the last 168 hours. No results for input(s): "TROPIPOC" in the last 168 hours.   Chemistry Recent Labs  Lab 01/05/23 1717 01/06/23 0657 01/07/23 0712 01/09/23 1503 01/10/23 0720 01/11/23 0738  NA 134* 133*   < > 135 133* 135  K 3.9 3.8   < > 3.7 4.3 4.0  CL 97*  97*   < > 97* 97* 98  CO2 21* 24   < > 27 25 23   GLUCOSE 161* 140*   < > 120* 83 96  BUN 29* 38*   < > 17 23* 30*  CREATININE 8.51* 9.77*   < > 6.70* 8.44* 10.47*  CALCIUM 9.0 8.5*   < > 7.9* 7.7* 8.1*  PROT 7.4 6.3*  --   --   --   --   ALBUMIN 3.9 3.2*   < > 3.1* 2.9* 3.0*  AST 15 12*  --   --   --   --   ALT 16 14  --   --   --   --   ALKPHOS 47 33*  --   --   --   --   BILITOT 0.8 0.4  --   --   --   --   GFRNONAA 7* 6*   < > 10* 7* 6*  ANIONGAP 16* 12   < > 11 11 14    < > = values in this interval not displayed.     Hematology Recent Labs  Lab 01/09/23 1503 01/10/23 0720 01/11/23 0738  WBC 5.3 4.3 5.4  RBC 3.90* 3.42* 3.76*  HGB 11.9* 10.9* 10.9*  HCT 36.8* 32.9* 35.1*  MCV 94.4 96.2 93.4  MCH 30.5 31.9 29.0  MCHC 32.3  33.1 31.1  RDW 14.2 13.9 14.4  PLT 155 168 191    BNPNo results for input(s): "BNP", "PROBNP" in the last 168 hours.   DDimer No results for input(s): "DDIMER" in the last 168 hours.   Radiology    No results found.  Cardiac Studies   2d echo 12/2022   1. Left ventricular ejection fraction, by estimation, is 40 to 45%. The  left ventricle has mildly decreased function. The left ventricle  demonstrates global hypokinesis. There is moderate left ventricular  hypertrophy. Left ventricular diastolic  parameters are consistent with Grade II diastolic dysfunction  (pseudonormalization).   2. Right ventricular systolic function is normal. The right ventricular  size is mildly enlarged. Tricuspid regurgitation signal is inadequate for  assessing PA pressure.   3. Left atrial size was mildly dilated.   4. The mitral valve is normal in structure. Trivial mitral valve  regurgitation. No evidence of mitral stenosis.   5. The aortic valve is tricuspid. Aortic valve regurgitation is not  visualized. No aortic stenosis is present.   6. Mildly dilated pulmonary artery.   7. The inferior vena cava is dilated in size with <50% respiratory  variability, suggesting right atrial pressure of 15 mmHg.   CareEverywhere 10/2022  Procedure: A two-dimensional transthoracic echocardiogram with color flow and Doppler was performed. Study Quality: Fair.  Left Ventricle: The left ventricle is normal in size. There is mild concentric left ventricular hypertrophy. Ejection Fraction = 50-55%. The left ventricular wall motion is normal. Diastolic dysfunction, Grade III (restrictive pattern), consistent with markedly increased left atrial pressure.  Left Atrium: The left atrium is mildly dilated. Left atrial index is 58ml/m^2.  Right Atrium: Right atrial size is normal. 18cm2  Right Ventricle: The right ventricle is normal size. The right ventricular systolic function is normal.  Aortic  Valve: The aortic valve is trileaflet. The aortic valve opens well. Trace (trivial) aortic regurgitation.  Mitral Valve: The mitral valve leaflets appear normal. There is no evidence of stenosis, fluttering, or prolapse. There is mild mitral regurgitation.  Tricuspid Valve: Structurally normal tricuspid valve. There is mild tricuspid regurgitation.  Estimated right atrial pressure is 5 mmHg.[name] RVSP not able to be calculated.  Pulmonic Valve: Structurally normal pulmonic valve. Mild pulmonic valvular regurgitation.  Arteries: The aortic root is normal size. Borderline dilated ascending aorta.  Venous: Mildly dilated inferior vena cava.  Pericardium/Pleura: Trivial pericardial effusion.  Cath 10/2022  INTERVENTIONAL CARDIOLOGIST: Jennette Bill, MD GENERAL CARDIOLOGY FELLOW: Threasa Alpha, MD INTERVENTIONAL FELLOW: None DATE OF PROCEDURE: 11/17/22  PROCEDURES: Ultrasound-guided Right Femoral artery access Ultrasound-guided Right Femoral vein access Selective Left and right coronary system angiography Right heart catheterization Conscious sedation monitoring time: 75 minutes  INDICATIONS: Pre-operative evaluation  HISTORY: Bennard Zafar is a 47yrs old Male with past medical history of HTN, DM-2, ESRD on home dialysis was seen in the cardiology clinic for a pre operative cardiac risk stratification ahead of being listed for a renal transplant . He was brought to the cardiac catheterization lab for right heart catheterization and coronary angiography and possible revascularization.  CONSENT: After the risk and benefits were explained to the patient, he was able to reiterate the risk and benefits along with the details of the procedure and consented to the procedure.  CATHETERIZATION LABORATORY STATISTICS: Total contrast used for this case was 50 cc of Visipaque contrast. Total fluoroscopic time for this case was 13.8 minutes. Total dose area product was 2850  cGycm2 Cumulative Air Kerma was 301 mGy Blood Loss: Less than 10mL  DESCRIPTION OF PROCEDURE: The patient was brought to the Cardiac Catheterization Laboratory in stable fasting condition. After informed consent was obtained and a WHO time out was performed with the entire cath lab team present before commencing the procedure. Local anesthesia administered with 2% Lidocaine solution subcutaneously.The patient was provided with conscious sedation with a total of 1mg  intravenous Versed. Arterial access was achieved with a micro puncture under ultrasound guidance and a 6 Fr Ultrasound guided Right Femoral artery sheath was inserted. Venous access was achieved with a micro puncture under ultrasound guidance and a 7 Fr Ultrasound guided Right Femoral vein sheath was inserted. Right heart catheterization was performed with a 7 Fr Arrow thermodilution catheter A 6 Fr JL 4.0 catheter was used to engage the left coronary artery. A 6 Fr JR 4.0 catheter was used to engage the right coronary artery. The LV cavity was not entered. Multiple angiographic views were obtained to delineate the coronary anatomy. 0.035" 145cm J wire Guidewires were used during the procedure. Arterial hemostasis was achieved with Manual compression. Venous hemostasis was achieved with manual compression. All catheters and wires were removed at the end of the procedure.   HEMODYNAMIC DATA: Aortic blood pressure is 185/96 mmHg with a mean of 138 mmHg. Right atrial pressure mean is 8 mmHg. Right ventricular pressure is 62/10 mmHg Pulmonary arterial pressure is 56/22 mmHg with mean of 42 mmHg. Pulmonary wedge catheter pressure mean is 21 mmHg. V wave is 32 mmHg.  SATURATION DATA: 1. Hemoglobin is 10 g/dL. 2. Right atrial saturation is 75 %. 3. Pulmonary artery saturation is 78 %. 4. Aortic saturation is 93 %.  USING MODIFIED FICK METHOD: Cardiac Output is 13 L/min. Cardiac index is 6.3 L/min/m-2 SVR is 800 dsc-5 PVR is 1.6 Woods  Units.  ANGIOGRAPHIC DATA:  Left main coronary artery: Large caliber vessel. Angiographically, no evidence of obstructive disease.  Left anterior descending coronary artery (LAD): Large caliber vessel, giving rise to Septal perforators and a large diagonal. LAD wraps around the apex. Angiographically, no evidence of obstructive disease.  Left circumflex coronary artery (LCx): Large caliber vessel  which gives off a large OM1 branch before continuing as AV grrove LCx. OM1 bifurcates into superior and inferior branches. There is no evidence of obstructive disease.  Right coronary artery (RCA): Large caliber vessel. Dominant. Distally bifurcates into PDA and PLV. Angiographically, no evidence of obstructive disease.  IMPRESSION: No evidence of epicardial CAD. Moderate post capillary pulmonary HTN.  RECOMMENDATIONS: Continue aggressive risk factor modifications for prevention of CAD. Management of pulmonary hypertension per primary cardiology team.  The results of the procedure were discussed with the patient.  Jennette Bill, MD was present and supervised during the entire procedure   Patient Profile     47 y.o. male with ESRD, HTN, DM, gastroparesis admitted with fever, cough, nausea, vomiting. Found to have RML pneumonia and newly diagnosed decreased LVEF. Med management complicated by nausea, vomiting, gastroparesis. TSH also abnormal recommended for OP repeat. Of note, patient was following at [facility] recently (had moved to [facility]) and had OV with cardiology there 10/2022, EF 50-55% by echo then, recommended for [facility] in preparation for renal transplant eval, do not see completed.  Assessment & Plan    1. Acute HFmrEF with persistent hypertension - EF 50-55% in 10/2022, here 40-45%, etiology of drop not known at this time, ? Stress induced, HTNive, seems less likely CAD given recent cath 10/2022 without significant CAD - current rx includes carvedilol 25mg  BID, Entresto 24/26mg  BID (need  to watch for hyperK given ESRD) - volume managed by HD - will discuss med titration with MD - patient will need to inform transplant team of admission given constellation of issues diagnosed here  2. RML PNA and n/v/gastroparesis - per primary team  3. ESRD  - on HD per nephrology  Remainder per primary team Per discussion with pt, arranged local cardiac f/u on 10/2, appt info placed on AVS  For questions or updates, please contact [facility] Please consult [email or web address] for contact info under Cardiology/STEMI.  Signed, Laurann Montana, PA-C 01/12/2023, 12:01 PM    Patient seen and examined with DD PA-C.  Agree as above, with the following exceptions and changes as noted below. Feels well. Discussed cardiac meds in detail. Gen: NAD, CV: RRR, no murmurs, Lungs: clear, Abd: soft, Extrem: Warm, well perfused, no edema, Neuro/Psych: alert and oriented x 3, normal mood and affect. All available labs, radiology testing, previous records reviewed. Did not tolerate carvedilol due to fatigue previously but now feels he may be doing ok on it, would have low threshold to try alternate BB if fatigue still endorsed however BB recommended due to mildly reduced EF.  - cont entresto - neph has seen and felt BP ok, ok to dc from CV perspective with close follow up and monitoring, may need further med titration but we are somewhat limited with ESRD.  Parke Poisson, MD 01/12/23 2:00 PM

## 2023-01-12 NOTE — Progress Notes (Signed)
Admit: 01/05/2023 LOS: 6  73F ESRD on HHD 4x/wk with CAP, N/V  Subjective - seen in room. Nausea resolved and eating solids today w/o difficulty. Feels a lot better.   Physical Exam:  Blood pressure 138/77, pulse 93, temperature 98.7 F (37.1 C), temperature source Oral, resp. rate 17, height 5\' 9"  (1.753 m), weight 86.5 kg, SpO2 95%. NAD Regular, normal S1 and S2 Diminished in the bases, otherwise clear bilaterally No significant edema Right forearm AVF with bruit and thrill Nonfocal, CN II through XII grossly intact Soft, nontender  Dialysis Orders: [facility]. Follows at [facility], phone: [phone]  3h   1K bath  91kg  AVF  Heparin 6000 bolus  - mircera q month, last given 8/15 --> due 9/15 approx  CXR 9/12 - RLL infiltrate, no edema   Assessment/ Plan HCAP - on ceftriaxone and azithromycin per TRH, slowly improving Reduced LVEF - cardiology following and adjusting medications including initiation of Entresto, beta-blocker ESRD - on HHD [facility] using AVF; lives locally but followed by [facility] nephrology. Had dialysis here Sat and Monday. Next HD tomorrow.  N/V/ orthostatic symptoms - possibly was too dry (came in under dry wt and we took more off w/ HD here). Gave ~ 1.5 L yest and overnight. Feeling better today.  Volume - standing wt 87kg today, is up on his feet, no orthostatic symptoms. He feels dry wt needs to be lowered. Will lower to 90kg at discharge.  HTN - cont entresto and metoprolol, BP's okay  Anemia of ESRD: On ESA q. 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 Dispo - probable dc today  Vinson Moselle  MD  [facility] 01/12/2023, 12:16 PM  Recent Labs  Lab 01/10/23 0720 01/11/23 0738  HGB 10.9* 10.9*  ALBUMIN 2.9* 3.0*  CALCIUM 7.7* 8.1*  PHOS 3.7 4.7*  CREATININE 8.44* 10.47*  K 4.3 4.0    Inpatient medications:  (feeding supplement) PROSource Plus  30 mL Oral BID BM    calcitRIOL  0.5 mcg Oral Daily   carvedilol  25 mg Oral BID WC   Chlorhexidine Gluconate Cloth  6 each Topical Q0600   Chlorhexidine Gluconate Cloth  6 each Topical Q0600   darbepoetin (ARANESP) injection - DIALYSIS  60 mcg Subcutaneous Q Fri-1800   feeding supplement  237 mL Oral TID BM   guaiFENesin  1,200 mg Oral BID   insulin aspart  0-9 Units Subcutaneous TID WC   metoCLOPramide (REGLAN) injection  10 mg Intravenous Q6H   pantoprazole  40 mg Oral BID   sacubitril-valsartan  1 tablet Oral BID   scopolamine  1 patch Transdermal Q72H   sodium chloride flush  3 mL Intravenous Q12H    sodium chloride     sodium chloride, acetaminophen **OR** acetaminophen, albuterol, alum & mag hydroxide-simeth, HYDROcodone bit-homatropine, HYDROcodone-acetaminophen, prochlorperazine, sodium chloride flush

## 2023-01-12 NOTE — Progress Notes (Signed)
Occupational Therapy Treatment Patient Details Name: [name] MRN: [id number] DOB: 1975-09-10 Today's Date: 01/12/2023   History of present illness Patient 47 year old gentleman presented to the ED 9/12 with 3-day history of fever of 101.2, nausea vomiting, productive cough of clear sputum and noted on chest x-ray and CT abdomen and pelvis to have a right middle lobe pneumonia.  COVID-19 PCR negative. PMH: ESRD on dialysis TTS, history of hypertension, diabetes mellitus type 2, gastroparesis   OT comments  Pt making excellent progress towards OT goals, reports moving about room independently without issues. Pt able to manage ADLs assessed without physical assistance as well as mobility around unit and balance challenges. As visual impairments has been an issue for a few years, pt able to implement compensatory strategies well. Will continue to follow acutely at a distance to ensure continued progress.       If plan is discharge home, recommend the following:  Assistance with cooking/housework;Assist for transportation   Equipment Recommendations  None recommended by OT    Recommendations for Other Services      Precautions / Restrictions Precautions Precautions: Fall Precaution Comments: peripheral vision deficits Restrictions Weight Bearing Restrictions: No       Mobility Bed Mobility Overal bed mobility: Modified Independent                  Transfers Overall transfer level: Independent Equipment used: None Transfers: Sit to/from Stand Sit to Stand: Independent                 Balance Overall balance assessment: Needs assistance Sitting-balance support: Feet supported Sitting balance-Leahy Scale: Good     Standing balance support: During functional activity, No upper extremity supported Standing balance-Leahy Scale: Good                             ADL either performed or assessed with clinical judgement   ADL Overall ADL's :  Modified independent                 Upper Body Dressing : Modified independent;Standing Upper Body Dressing Details (indicate cue type and reason): donning gown around backside                   General ADL Comments: reports going to bathroom without assist, able to mobilize around unit without AD and without issues. gait fairly steady and functional, able to pick up item from floor without LOB or dizziness. pt reports feeling as if he could see better if had glasses that would faciliate tunnel vision; i.e. paper towel roll over each eye    Extremity/Trunk Assessment Upper Extremity Assessment Upper Extremity Assessment: Overall WFL for tasks assessed   Lower Extremity Assessment Lower Extremity Assessment: Defer to PT evaluation        Vision   Vision Assessment?: Vision impaired- to be further tested in functional context Additional Comments: R eye lag in tracking with L eye. peripheral vision impaired for years. able to read signs in hallway functionally. reports still driving, etc - able to implement compensatory strategies at baseline without issues. per prior OT eval, pt with floaters in central vision though did not report during session today   Perception     Praxis      Cognition Arousal: Alert Behavior During Therapy: [facility] for tasks assessed/performed Overall Cognitive Status: Within Functional Limits for tasks assessed  Exercises      Shoulder Instructions       General Comments      Pertinent Vitals/ Pain       Pain Assessment Pain Assessment: No/denies pain  Home Living                                          Prior Functioning/Environment              Frequency  Min 1X/week        Progress Toward Goals  OT Goals(current goals can now be found in the care plan section)  Progress towards OT goals: Progressing toward goals  Acute Rehab OT  Goals Patient Stated Goal: home today if possible OT Goal Formulation: With patient Time For Goal Achievement: 01/24/23 Potential to Achieve Goals: Good ADL Goals Pt Will Perform Grooming: with modified independence Pt Will Perform Lower Body Dressing: with modified independence;sit to/from stand Pt Will Transfer to Toilet: with modified independence;ambulating Additional ADL Goal #1: Pt will navigate hospital environment with mod I utilizing low vision strategies to demonstrated reduced fall risk  Plan      Co-evaluation                 AM-PAC OT "6 Clicks" Daily Activity     Outcome Measure   Help from another person eating meals?: None Help from another person taking care of personal grooming?: None Help from another person toileting, which includes using toliet, bedpan, or urinal?: None Help from another person bathing (including washing, rinsing, drying)?: A Little Help from another person to put on and taking off regular upper body clothing?: None Help from another person to put on and taking off regular lower body clothing?: None 6 Click Score: 23    End of Session Equipment Utilized During Treatment: Gait belt  OT Visit Diagnosis: Unsteadiness on feet (R26.81);Other abnormalities of gait and mobility (R26.89);Muscle weakness (generalized) (M62.81);Low vision, both eyes (H54.2)   Activity Tolerance Patient tolerated treatment well   Patient Left in bed;with call bell/phone within reach;Other (comment) (Cardio at bedside)   Nurse Communication          Time: [phone] OT Time Calculation (min): 20 min  Charges: OT General Charges $OT Visit: 1 Visit OT Treatments $Therapeutic Activity: 8-22 mins  Bradd Canary, OTR/L Acute Rehab Services Office: [phone]   Lorre Munroe 01/12/2023, 12:40 PM

## 2023-01-12 NOTE — Progress Notes (Signed)
D/C order noted. Contacted [facility] therapy dept and spoke to [employer]. Clinic advised that pt will d/c today and will be resuming home HD at d/c. D/C summary and last renal note faxed to clinic for continuation of care.   Olivia Canter Renal Navigator [phone]

## 2023-01-12 NOTE — Discharge Summary (Signed)
Physician Discharge Summary  BURDELL POMPER JXB:[id number] DOB: Aug 09, 1975 DOA: 01/05/2023  PCP: Starla Link, MD  Admit date: 01/05/2023 Discharge date: 01/12/2023  Admitted From: Home Disposition:  Home  Discharge Condition:Stable CODE STATUS:FULL Diet recommendation: renal  Brief/Interim Summary: Patient is a 47 year old male with history of ESRD on dialysis on TTS schedule, hypertension, diabetes type 2, gastroparesis, right-handed admitted with bilateral peripheral vision loss  who presented with 3-day history of fever, nausea, vomiting, cough.  Chest imaging showed right middle lobe pneumonia.  Started on antibiotics.  Echo showed EF of 40 to 45% with global hypokinesis.  Cardiology, nephrology consulted.  He has completed antibiotics course.  Medications have been optimized.  Cardiology, nephrology cleared for discharge.  He will follow-up with cardiology as an outpatient.  Following problems were addressed during the hospitalization:  Right middle lobe pneumonia: Presented with cough, fever, nausea, vomiting.  Chest imaging showed right middle lobe pneumonia.  COVID screen test negative.  Sputum culture showed normal respiratory flora.  Blood cultures have been negative.  Urine Legionella/pneumococcal antigen negative.  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  Cardiology was consulted and following.  Medication optimized.  He will follow-up with cardiology as an outpatient.  Recommending repeat echo in 3 months . He has been started on Entresto.  Due to nausea and vomiting, patient was put on IV metoprolol,will change back to coreg   Nausea/vomiting/history of gastroparesis: On as needed Reglan.   Much better with scopolamine, IV Compazine. PT recommend outpatient vestibular therapy   ESRD on  dialysis: Nephrology following for dialysis.  Patient had orthostatic symptoms and was given IV fluids on 9/18.Being dialysed today   Hypertension: On Norvasc, Procardia at home, currently discontinued.  Currently on Coreg, Entresto   Normocytic anemia: Secondary to ESRD, stable   Well-controlled diabetes type 2: A1c of 5.3.    Abnormal TSH: TSH of 16.  Free T4 normal.  Outpatient follow-up with repeat testing in 4-6 weeks       Discharge Diagnoses:  Principal Problem:   CAP (community acquired pneumonia) Active Problems:   Nausea & vomiting   Elevated troponin   ESRD (end stage renal disease) (HCC)   Abnormal echocardiogram   Hypertension   Anemia of chronic disease   Type 2 diabetes mellitus without complication, without long-term current use of insulin (HCC)   Abnormal TSH   Gastroparesis   Dilated cardiomyopathy [facility])    Discharge Instructions  Discharge Instructions     Ambulatory referral to Physical Therapy   Complete by: As directed    Diet - low sodium heart healthy   Complete by: As directed    Discharge instructions   Complete by: As directed    1)Please take prescribed medications as instructed 2)Follow up with your PCP in a week.Do a thyroid function test in 4 to 6 weeks 3)You have an appointment with cardiology on October 2   Increase activity slowly   Complete by: As directed       Allergies as of 01/12/2023       Reactions   Ms Contin [morphine] Anaphylaxis, Swelling   Full body swelling   Sulfa Antibiotics Swelling   Facial and eye swelling        Medication List     STOP taking these medications    doxazosin 2 MG tablet Commonly known as: CARDURA  losartan 25 MG tablet Commonly known as: COZAAR   NIFEdipine 60 MG 24 hr tablet Commonly known as: PROCARDIA XL/NIFEDICAL XL       TAKE these medications    calcitRIOL 0.5 MCG capsule Commonly known as: ROCALTROL Take 0.5 mcg by mouth daily.   carvedilol 25 MG  tablet Commonly known as: COREG Take 1 tablet (25 mg total) by mouth 2 (two) times daily with a meal.   ibuprofen 200 MG tablet Commonly known as: ADVIL Take 400 mg by mouth 2 (two) times daily as needed for headache or moderate pain.   metoCLOPramide 5 MG tablet Commonly known as: REGLAN Take 1 tablet (5 mg total) by mouth every 6 (six) hours as needed for nausea or vomiting.   pantoprazole 40 MG tablet Commonly known as: PROTONIX Take 1 tablet (40 mg total) by mouth 2 (two) times daily before a meal.   sacubitril-valsartan 24-26 MG Commonly known as: ENTRESTO Take 1 tablet by mouth 2 (two) times daily.   VITAMIN B-12 PO Take 1 tablet by mouth daily.        Follow-up Information     Laurann Montana, PA-C Follow up.   Specialties: Cardiology, Radiology Why: [facility] [address] location - cardiology follow-up arranged on Wednesday Jan 25, 2023 2:45 PM (Arrive by 2:30 PM). Contact information: [address] [place] [address] [phone]         Starla Link, MD. Schedule an appointment as soon as possible for a visit in 1 week(s).   Specialty: Nephrology Contact information: [address] [place] [address] [phone]                Allergies  Allergen Reactions   Ms Contin [Morphine] Anaphylaxis and Swelling    Full body swelling   Sulfa Antibiotics Swelling    Facial and eye swelling    Consultations: Cardiology, nephrology   Procedures/Studies: CT HEAD WO CONTRAST ( )  Result Date: 01/08/2023 CLINICAL DATA:  Peripheral vertigo EXAM: CT HEAD WITHOUT CONTRAST TECHNIQUE: Contiguous axial images were obtained from the base of the skull through the vertex without intravenous contrast. RADIATION DOSE REDUCTION: This exam was performed according to the departmental dose-optimization program which includes automated exposure control, adjustment of the mA and/or kV according to patient size and/or use of  iterative reconstruction technique. COMPARISON:  None Available. FINDINGS: Brain: There is no acute intracranial hemorrhage, extra-axial fluid collection, or acute infarct Parenchymal volume is normal. The ventricles are normal in size. Gray-white differentiation is preserved. The pituitary and suprasellar region are normal. There is no mass lesion. There is no mass effect or midline shift. Vascular: There is calcification of the bilateral carotid siphons and vertebral arteries. Skull: Choose Sinuses/Orbits: The imaged paranasal sinuses are clear. There is bilateral proptosis. The extraocular muscles are normal. There is no orbital mass. Bilateral lens implants are in place. Other: The mastoid air cells and middle ear cavities are clear. IMPRESSION: No acute intracranial pathology. Electronically Signed   By: Lesia Hausen M.D.   On: 01/08/2023 15:52   ECHOCARDIOGRAM COMPLETE  Result Date: 01/06/2023    ECHOCARDIOGRAM REPORT   Patient Name:   J[facility]ONY GUL [facility] Date of Exam: 01/06/2023 Medical Rec #:  [id number]        Height:       69.0 in Accession #:    [id number]       Weight:       207.2 lb Date of Birth:  [birth year]        BSA:          2.097 m Patient Age:    47 years         BP:           151/84 mmHg Patient Gender: M                HR:           89 bpm. Exam Location:  Inpatient Procedure: 2D Echo, Cardiac Doppler and Color Doppler Indications:    Abnormal ECG R94.31  History:        Patient has no prior history of Echocardiogram examinations.                 Risk Factors:Hypertension.  Sonographer:    Harriette Bouillon RDCS Referring Phys: [id number] ANASTASSIA DOUTOVA IMPRESSIONS  1. Left ventricular ejection fraction, by estimation, is 40 to 45%. The left ventricle has mildly decreased function. The left ventricle demonstrates global hypokinesis. There is moderate left ventricular hypertrophy. Left ventricular diastolic parameters are consistent with Grade II diastolic dysfunction (pseudonormalization).  2. Right  ventricular systolic function is normal. The right ventricular size is mildly enlarged. Tricuspid regurgitation signal is inadequate for assessing PA pressure.  3. Left atrial size was mildly dilated.  4. The mitral valve is normal in structure. Trivial mitral valve regurgitation. No evidence of mitral stenosis.  5. The aortic valve is tricuspid. Aortic valve regurgitation is not visualized. No aortic stenosis is present.  6. Mildly dilated pulmonary artery.  7. The inferior vena cava is dilated in size with <50% respiratory variability, suggesting right atrial pressure of 15 mmHg. FINDINGS  Left Ventricle: Left ventricular ejection fraction, by estimation, is 40 to 45%. The left ventricle has mildly decreased function. The left ventricle demonstrates global hypokinesis. The left ventricular internal cavity size was normal in size. There is  moderate left ventricular hypertrophy. Left ventricular diastolic parameters are consistent with Grade II diastolic dysfunction (pseudonormalization). Right Ventricle: The right ventricular size is mildly enlarged. No increase in right ventricular wall thickness. Right ventricular systolic function is normal. Tricuspid regurgitation signal is inadequate for assessing PA pressure. Left Atrium: Left atrial size was mildly dilated. Right Atrium: Right atrial size was normal in size. Pericardium: Trivial pericardial effusion is present. Mitral Valve: The mitral valve is normal in structure. Trivial mitral valve regurgitation. No evidence of mitral valve stenosis. Tricuspid Valve: The tricuspid valve is normal in structure. Tricuspid valve regurgitation is trivial. No evidence of tricuspid stenosis. Aortic Valve: The aortic valve is tricuspid. Aortic valve regurgitation is not visualized. No aortic stenosis is present. Pulmonic Valve: The pulmonic valve was normal in structure. Pulmonic valve regurgitation is trivial. No evidence of pulmonic stenosis. Aorta: The aortic root is normal  in size and structure. Pulmonary Artery: The pulmonary artery is mildly dilated. Venous: The inferior vena cava is dilated in size with less than 50% respiratory variability, suggesting right atrial pressure of 15 mmHg. IAS/Shunts: No atrial level shunt detected by color flow Doppler.  LEFT VENTRICLE PLAX 2D LVIDd:         5.30 cm   Diastology LVIDs:         4.20 cm   LV e' medial:    4.24 cm/s LV PW:         1.30 cm   LV E/e' medial:  26.2 LV IVS:        1.30 cm   LV e' lateral:   6.53  cm/s LVOT diam:     2.20 cm   LV E/e' lateral: 17.0 LV SV:         58 LV SV Index:   28 LVOT Area:     3.80 cm  RIGHT VENTRICLE             IVC RV S prime:     12.00 cm/s  IVC diam: 2.10 cm TAPSE (M-mode): 1.9 cm LEFT ATRIUM             Index        RIGHT ATRIUM           Index LA diam:        4.40 cm 2.10 cm/m   RA Area:     17.20 cm LA Vol (A2C):   91.6 ml 43.67 ml/m  RA Volume:   48.20 ml  22.98 ml/m LA Vol (A4C):   68.3 ml 32.56 ml/m LA Biplane Vol: 79.8 ml 38.05 ml/m  AORTIC VALVE LVOT Vmax:   94.20 cm/s LVOT Vmean:  60.400 cm/s LVOT VTI:    0.153 m  AORTA Ao Root diam: 3.10 cm Ao Asc diam:  3.20 cm MITRAL VALVE MV Area (PHT): 5.31 cm     SHUNTS MV Decel Time: 143 msec     Systemic VTI:  0.15 m MV E velocity: 111.00 cm/s  Systemic Diam: 2.20 cm MV A velocity: 60.60 cm/s MV E/A ratio:  1.83 Weston Brass MD Electronically signed by Weston Brass MD Signature Date/Time: 01/06/2023/12:07:18 PM    Final    CT ABDOMEN PELVIS WO CONTRAST  Result Date: 01/05/2023 CLINICAL DATA:  Abdominal pain. EXAM: CT ABDOMEN AND PELVIS WITHOUT CONTRAST TECHNIQUE: Multidetector CT imaging of the abdomen and pelvis was performed following the standard protocol without IV contrast. RADIATION DOSE REDUCTION: This exam was performed according to the departmental dose-optimization program which includes automated exposure control, adjustment of the mA and/or kV according to patient size and/or use of iterative reconstruction technique.  COMPARISON:  None Available. FINDINGS: Lower chest: There is mild cardiomegaly with moderate to marked severity right middle lobe infiltrate. A small right pleural effusion is seen. Hepatobiliary: No focal liver abnormality is seen. There is a mild amount of perihepatic fluid. Multiple tiny gallstones are seen within the dependent portion of the gallbladder lumen. Pancreas: Unremarkable. No pancreatic ductal dilatation or surrounding inflammatory changes. Spleen: Normal in size without focal abnormality. Adrenals/Urinary Tract: Adrenal glands are unremarkable. Kidneys are normal, without obstructing renal calculi, focal lesion, or hydronephrosis. Renal vascular calcifications are noted. The urinary bladder is poorly distended and subsequently limited in evaluation. Moderate severity diffuse urinary bladder wall thickening is noted. Stomach/Bowel: Stomach is within normal limits. Appendix appears normal. No evidence of bowel wall thickening, distention, or inflammatory changes. Vascular/Lymphatic: Aortic atherosclerosis. No enlarged abdominal or pelvic lymph nodes. Reproductive: Prostate is unremarkable. Other: No abdominal wall hernia or abnormality. No abdominopelvic ascites. Musculoskeletal: No acute or significant osseous findings. IMPRESSION: 1. Moderate to marked severity right middle lobe infiltrate. 2. Small right pleural effusion. 3. Cholelithiasis. 4. Moderate severity diffuse urinary bladder wall thickening which may be secondary to chronic bladder outlet obstruction. Correlation with urinalysis is recommended to exclude the presence of acute cystitis. 5. Aortic atherosclerosis. Aortic Atherosclerosis (ICD10-I70.0). Electronically Signed   By: Aram Candela M.D.   On: 01/05/2023 21:09   DG Chest 2 View  Result Date: 01/05/2023 CLINICAL DATA:  Suspect sepsis EXAM: CHEST - 2 VIEW COMPARISON:  Chest x-ray 09/12/2007 FINDINGS: There is airspace  disease in the right middle lobe. The cardiomediastinal  silhouette is within normal limits. No pleural effusion or pneumothorax. No acute fractures. IMPRESSION: Right middle lobe pneumonia. Follow-up imaging recommended in 4-6 weeks to confirm resolution. Electronically Signed   By: Darliss Cheney M.D.   On: 01/05/2023 19:47      Subjective: Patient seen and examined at bedside today.  Hemodynamically stable.  Comfortable.  On room air.  Denies nausea and vomiting today.  Medically stable for discharge  Discharge Exam: Vitals:   01/11/23 2101 01/12/23 0800  BP: 138/77   Pulse: 93   Resp: 18 17  Temp: 98.8 F (37.1 C) 98.7 F (37.1 C)  SpO2: 99% 95%   Vitals:   01/11/23 1805 01/11/23 1823 01/11/23 2101 01/12/23 0800  BP: (!) 172/92  138/77   Pulse: 86  93   Resp: 13  18 17   Temp:   98.8 F (37.1 C) 98.7 F (37.1 C)  TempSrc:   Oral Oral  SpO2: 100%  99% 95%  Weight:  86.5 kg    Height:        General: Pt is alert, awake, not in acute distress Cardiovascular: RRR, S1/S2 +, no rubs, no gallops Respiratory: CTA bilaterally, no wheezing, no rhonchi Abdominal: Soft, NT, ND, bowel sounds + Extremities: no edema, no cyanosis, right upper extremity fistula    The results of significant diagnostics from this hospitalization (including imaging, microbiology, ancillary and laboratory) are listed below for reference.     Microbiology: Recent Results (from the past 240 hour(s))  Culture, blood (Routine x 2)     Status: None   Collection Time: 01/05/23  5:18 PM   Specimen: BLOOD  Result Value Ref Range Status   Specimen Description BLOOD SITE NOT SPECIFIED  Final   Special Requests   Final    BOTTLES DRAWN AEROBIC AND ANAEROBIC Blood Culture results may not be optimal due to an inadequate volume of blood received in culture bottles   Culture   Final    NO GROWTH 5 DAYS Performed at [facility], [address], [place], [place] [address]    Report Status 01/10/2023 FINAL  Final  Culture, blood (Routine x 2)     Status: None    Collection Time: 01/05/23  7:03 PM   Specimen: BLOOD  Result Value Ref Range Status   Specimen Description BLOOD SITE NOT SPECIFIED  Final   Special Requests   Final    BOTTLES DRAWN AEROBIC AND ANAEROBIC Blood Culture adequate volume   Culture   Final    NO GROWTH 5 DAYS Performed at [facility], [address] [place], [place] [address]    Report Status 01/10/2023 FINAL  Final  SARS Coronavirus 2 by RT PCR (hospital order, performed in [facility] hospital lab) *cepheid single result test* Anterior Nasal Swab     Status: None   Collection Time: 01/05/23  8:01 PM   Specimen: Anterior Nasal Swab  Result Value Ref Range Status   SARS Coronavirus 2 by RT PCR NEGATIVE NEGATIVE Final    Comment: Performed at [facility], [address] [place], [place] [address]  Expectorated Sputum Assessment w Gram Stain, Rflx to Resp Cult     Status: None   Collection Time: 01/06/23 12:50 AM   Specimen: Expectorated Sputum  Result Value Ref Range Status   Specimen Description EXPECTORATED SPUTUM  Final   Special Requests NONE  Final   Sputum evaluation  Final    THIS SPECIMEN IS ACCEPTABLE FOR SPUTUM CULTURE Performed at [facility], [address], [place], [place] [address]    Report Status 01/06/2023 FINAL  Final  Culture, Respiratory w Gram Stain     Status: None   Collection Time: 01/06/23 12:50 AM  Result Value Ref Range Status   Specimen Description EXPECTORATED SPUTUM  Final   Special Requests NONE Reflexed from [id number]  Final   Gram Stain   Final    ABUNDANT WBC PRESENT,BOTH PMN AND MONONUCLEAR FEW GRAM VARIABLE ROD FEW GRAM POSITIVE COCCI IN PAIRS IN SINGLES    Culture   Final    Normal respiratory flora-no Staph aureus or Pseudomonas seen Performed at [facility], [address], [place], [place] [address]    Report Status 01/09/2023 FINAL  Final     Labs: BNP (last 3 results) No results for input(s): "BNP" in the last 8760 hours. Basic  Metabolic Panel: Recent Labs  Lab 01/06/23 0657 01/07/23 0712 01/08/23 0735 01/09/23 0911 01/09/23 1503 01/10/23 0720 01/11/23 0738  NA 133*   < > 135 138 135 133* 135  K 3.8   < > 3.6 3.3* 3.7 4.3 4.0  CL 97*   < > 99 101 97* 97* 98  CO2 24   < > 23 22 27 25 23   GLUCOSE 140*   < > 102* 82 120* 83 96  BUN 38*   < > 32* 39* 17 23* 30*  CREATININE 9.77*   < > 8.66* 10.76* 6.70* 8.44* 10.47*  CALCIUM 8.5*   < > 7.9* 8.0* 7.9* 7.7* 8.1*  MG 2.1  --   --   --   --   --   --   PHOS 5.3*   < > 4.6 4.7* 2.8 3.7 4.7*   < > = values in this interval not displayed.   Liver Function Tests: Recent Labs  Lab 01/05/23 1717 01/06/23 0657 01/07/23 0712 01/08/23 0735 01/09/23 0911 01/09/23 1503 01/10/23 0720 01/11/23 0738  AST 15 12*  --   --   --   --   --   --   ALT 16 14  --   --   --   --   --   --   ALKPHOS 47 33*  --   --   --   --   --   --   BILITOT 0.8 0.4  --   --   --   --   --   --   PROT 7.4 6.3*  --   --   --   --   --   --   ALBUMIN 3.9 3.2*   < > 2.9* 2.9* 3.1* 2.9* 3.0*   < > = values in this interval not displayed.   Recent Labs  Lab 01/05/23 1903  LIPASE 18   No results for input(s): "AMMONIA" in the last 168 hours. CBC: Recent Labs  Lab 01/05/23 1717 01/06/23 0657 01/07/23 0712 01/08/23 0735 01/09/23 0911 01/09/23 1503 01/10/23 0720 01/11/23 0738  WBC 5.6   < > 6.8 5.0 5.3 5.3 4.3 5.4  NEUTROABS 4.6  --  5.2 3.2  --   --   --   --   HGB 10.1*   < > 9.7* 11.4* 11.1* 11.9* 10.9* 10.9*  HCT 32.7*   < > 30.9* 36.4* 35.3* 36.8* 32.9* 35.1*  MCV 92.9   < > 90.9 90.5 92.2 94.4 96.2 93.4  PLT 125*   < > 122* 134* 164 155 168 191   < > = values in this interval not displayed.   Cardiac Enzymes: No results for input(s): "CKTOTAL", "CKMB", "CKMBINDEX", "TROPONINI" in the last 168 hours. BNP: Invalid input(s): "POCBNP" CBG: Recent Labs  Lab 01/11/23 1138 01/11/23 2103 01/11/23 2335 01/12/23 0633 01/12/23 1145  GLUCAP 151* 156* 158* 107* 106*    D-Dimer No results for input(s): "DDIMER" in the last 72 hours. Hgb A1c No results for input(s): "HGBA1C" in the last 72 hours. Lipid Profile No results for input(s): "CHOL", "HDL", "LDLCALC", "TRIG", "CHOLHDL", "LDLDIRECT" in the last 72 hours. Thyroid function studies No results for input(s): "TSH", "T4TOTAL", "T3FREE", "THYROIDAB" in the last 72 hours.  Invalid input(s): "FREET3" Anemia work up No results for input(s): "VITAMINB12", "FOLATE", "FERRITIN", "TIBC", "IRON", "RETICCTPCT" in the last 72 hours. Urinalysis    Component Value Date/Time   COLORURINE YELLOW 01/06/2023 1050   APPEARANCEUR CLEAR 01/06/2023 1050   LABSPEC 1.020 01/06/2023 1050   PHURINE 6.0 01/06/2023 1050   GLUCOSEU 250 (A) 01/06/2023 1050   HGBUR MODERATE (A) 01/06/2023 1050   BILIRUBINUR NEGATIVE 01/06/2023 1050   KETONESUR NEGATIVE 01/06/2023 1050   PROTEINUR >300 (A) 01/06/2023 1050   NITRITE NEGATIVE 01/06/2023 1050   LEUKOCYTESUR NEGATIVE 01/06/2023 1050   Sepsis Labs Recent Labs  Lab 01/09/23 0911 01/09/23 1503 01/10/23 0720 01/11/23 0738  WBC 5.3 5.3 4.3 5.4   Microbiology Recent Results (from the past 240 hour(s))  Culture, blood (Routine x 2)     Status: None   Collection Time: 01/05/23  5:18 PM   Specimen: BLOOD  Result Value Ref Range Status   Specimen Description BLOOD SITE NOT SPECIFIED  Final   Special Requests   Final    BOTTLES DRAWN AEROBIC AND ANAEROBIC Blood Culture results may not be optimal due to an inadequate volume of blood received in culture bottles   Culture   Final    NO GROWTH 5 DAYS Performed at [facility] Lab, [address] [place], [place] [address]    Report Status 01/10/2023 FINAL  Final  Culture, blood (Routine x 2)     Status: None   Collection Time: 01/05/23  7:03 PM   Specimen: BLOOD  Result Value Ref Range Status   Specimen Description BLOOD SITE NOT SPECIFIED  Final   Special Requests   Final    BOTTLES DRAWN AEROBIC AND ANAEROBIC Blood  Culture adequate volume   Culture   Final    NO GROWTH 5 DAYS Performed at [facility], [address] [place], [place] [address]    Report Status 01/10/2023 FINAL  Final  SARS Coronavirus 2 by RT PCR (hospital order, performed in [facility] hospital lab) *cepheid single result test* Anterior Nasal Swab     Status: None   Collection Time: 01/05/23  8:01 PM   Specimen: Anterior Nasal Swab  Result Value Ref Range Status   SARS Coronavirus 2 by RT PCR NEGATIVE NEGATIVE Final    Comment: Performed at [facility], [address], [place], [place] [address]  Expectorated Sputum Assessment w Gram Stain, Rflx to Resp Cult     Status: None   Collection Time: 01/06/23 12:50 AM   Specimen: Expectorated Sputum  Result Value Ref Range Status   Specimen Description EXPECTORATED SPUTUM  Final   Special Requests NONE  Final   Sputum evaluation   Final    THIS SPECIMEN IS ACCEPTABLE FOR SPUTUM  CULTURE Performed at [facility], [address] [place], [place] [address]    Report Status 01/06/2023 FINAL  Final  Culture, Respiratory w Gram Stain     Status: None   Collection Time: 01/06/23 12:50 AM  Result Value Ref Range Status   Specimen Description EXPECTORATED SPUTUM  Final   Special Requests NONE Reflexed from [id number]  Final   Gram Stain   Final    ABUNDANT WBC PRESENT,BOTH PMN AND MONONUCLEAR FEW GRAM VARIABLE ROD FEW GRAM POSITIVE COCCI IN PAIRS IN SINGLES    Culture   Final    Normal respiratory flora-no Staph aureus or Pseudomonas seen Performed at [facility] Lab, [address], [place], [place] [address]    Report Status 01/09/2023 FINAL  Final    Please note: You were cared for by a hospitalist during your hospital stay. Once you are discharged, your primary care physician will handle any further medical issues. Please note that NO REFILLS for any discharge medications will be authorized once you are discharged, as it is imperative that you return to your  primary care physician (or establish a relationship with a primary care physician if you do not have one) for your post hospital discharge needs so that they can reassess your need for medications and monitor your lab values.    Time coordinating discharge: 40 minutes  SIGNED:   Burnadette Pop, MD  [facility] 01/12/2023, 1:53 PM Pager [phone]  If 7PM-7AM, please contact night-coverage [email or web address] Password [email or web address]

## 2023-01-24 NOTE — Progress Notes (Deleted)
Cardiology Office Note    Date:  01/24/2023  ID:  [name], DOB [birth year], MRN [id number] PCP:  Starla Link, MD  Cardiologist:  None  Electrophysiologist:  None   Chief Complaint: ***  History of Present Illness: [name]    FENTRESS CECILIO is a 47 y.o. male with visit-pertinent history of ESRD, HTN, DM, gastroparesis, mild cardiomyopathy seen for follow-up.  He was previously following at [facility] for possible transplant workup. 2D echo showed EF 50-55% in 10/2022, underwent cath (outlinedbelow) in 10/2022 with without significant CAD. He was recently admitted to our hospital with fever, cough, nausea, vomiting. Found to have RML pneumonia and newly diagnosed decreased LVEF, possibly stress induced versus related to hypertension. 2d Echo 01/06/23 showed EF 45-50% with global HK, G2DD, mildly enlarged RV, trivial MR, mildly dilated PA, dilated IVC. He did not previously tolerate carvedilol due to fatigue but was doing well back on it in the hospital. He was also started on Entresto, OK'd by nephro. TSH was abnormal with normal FT4.  ? Transplant status with decline in EF, plan for repeat echo  Mild cardiomyopathy Essential HTN ESRD on HD Abnormal thyroid function test (TSH)   Labwork independently reviewed: 12/2022 Hgb 10.9, plt 191, K 4.0, Cr 10.47, LDL 46, trig 116, TSH 16 with normal FT4, alb 3.2, AST 12, ALT OK  ROS: .    Please see the history of present illness. Otherwise, review of systems is positive for ***.  All other systems are reviewed and otherwise negative.  Studies Reviewed: [name]    EKG:  EKG is ordered today, personally reviewed, demonstrating ***  CV Studies: Cardiac studies reviewed are outlined and summarized above. Otherwise please see EMR for full report.  2d echo 12/2022   1. Left ventricular ejection fraction, by estimation, is 40 to 45%. The  left ventricle has mildly decreased function. The left ventricle  demonstrates global hypokinesis. There is  moderate left ventricular  hypertrophy. Left ventricular diastolic  parameters are consistent with Grade II diastolic dysfunction  (pseudonormalization).   2. Right ventricular systolic function is normal. The right ventricular  size is mildly enlarged. Tricuspid regurgitation signal is inadequate for  assessing PA pressure.   3. Left atrial size was mildly dilated.   4. The mitral valve is normal in structure. Trivial mitral valve  regurgitation. No evidence of mitral stenosis.   5. The aortic valve is tricuspid. Aortic valve regurgitation is not  visualized. No aortic stenosis is present.   6. Mildly dilated pulmonary artery.   7. The inferior vena cava is dilated in size with <50% respiratory  variability, suggesting right atrial pressure of 15 mmHg.    CareEverywhere 10/2022  Procedure: A two-dimensional transthoracic echocardiogram with color flow and Doppler was performed. Study Quality: Fair.  Left Ventricle: The left ventricle is normal in size. There is mild concentric left ventricular hypertrophy. Ejection Fraction = 50-55%. The left ventricular wall motion is normal. Diastolic dysfunction, Grade III (restrictive pattern), consistent with markedly increased left atrial pressure.  Left Atrium: The left atrium is mildly dilated. Left atrial index is 63ml/m^2.  Right Atrium: Right atrial size is normal. 18cm2  Right Ventricle: The right ventricle is normal size. The right ventricular systolic function is normal.  Aortic Valve: The aortic valve is trileaflet. The aortic valve opens well. Trace (trivial) aortic regurgitation.  Mitral Valve: The mitral valve leaflets appear normal. There is no evidence of stenosis, fluttering, or prolapse. There is mild mitral regurgitation.  Tricuspid  Valve: Structurally normal tricuspid valve. There is mild tricuspid regurgitation. Estimated right atrial pressure is 5 mmHg.[name] RVSP not able to be calculated.  Pulmonic  Valve: Structurally normal pulmonic valve. Mild pulmonic valvular regurgitation.  Arteries: The aortic root is normal size. Borderline dilated ascending aorta.  Venous: Mildly dilated inferior vena cava.  Pericardium/Pleura: Trivial pericardial effusion.   Cath 10/2022  INTERVENTIONAL CARDIOLOGIST: Jennette Bill, MD GENERAL CARDIOLOGY FELLOW: Threasa Alpha, MD INTERVENTIONAL FELLOW: None DATE OF PROCEDURE: 11/17/22  PROCEDURES: Ultrasound-guided Right Femoral artery access Ultrasound-guided Right Femoral vein access Selective Left and right coronary system angiography Right heart catheterization Conscious sedation monitoring time: 75 minutes  INDICATIONS: Pre-operative evaluation  HISTORY: [name] is a 47yrs old Male with past medical history of HTN, DM-2, ESRD on home dialysis was seen in the cardiology clinic for a pre operative cardiac risk stratification ahead of being listed for a renal transplant . He was brought to the cardiac catheterization lab for right heart catheterization and coronary angiography and possible revascularization.  CONSENT: After the risk and benefits were explained to the patient, he was able to reiterate the risk and benefits along with the details of the procedure and consented to the procedure.  CATHETERIZATION LABORATORY STATISTICS: Total contrast used for this case was 50 cc of Visipaque contrast. Total fluoroscopic time for this case was 13.8 minutes. Total dose area product was 2850 cGycm2 Cumulative Air Kerma was 301 mGy Blood Loss: Less than 10mL  DESCRIPTION OF PROCEDURE: The patient was brought to the Cardiac Catheterization Laboratory in stable fasting condition. After informed consent was obtained and a WHO time out was performed with the entire cath lab team present before commencing the procedure. Local anesthesia administered with 2% Lidocaine solution subcutaneously.The patient was provided with conscious sedation  with a total of 1mg  intravenous Versed. Arterial access was achieved with a micro puncture under ultrasound guidance and a 6 Fr Ultrasound guided Right Femoral artery sheath was inserted. Venous access was achieved with a micro puncture under ultrasound guidance and a 7 Fr Ultrasound guided Right Femoral vein sheath was inserted. Right heart catheterization was performed with a 7 Fr Arrow thermodilution catheter A 6 Fr JL 4.0 catheter was used to engage the left coronary artery. A 6 Fr JR 4.0 catheter was used to engage the right coronary artery. The LV cavity was not entered. Multiple angiographic views were obtained to delineate the coronary anatomy. 0.035" 145cm J wire Guidewires were used during the procedure. Arterial hemostasis was achieved with Manual compression. Venous hemostasis was achieved with manual compression. All catheters and wires were removed at the end of the procedure.   HEMODYNAMIC DATA: Aortic blood pressure is 185/96 mmHg with a mean of 138 mmHg. Right atrial pressure mean is 8 mmHg. Right ventricular pressure is 62/10 mmHg Pulmonary arterial pressure is 56/22 mmHg with mean of 42 mmHg. Pulmonary wedge catheter pressure mean is 21 mmHg. V wave is 32 mmHg.  SATURATION DATA: 1. Hemoglobin is 10 g/dL. 2. Right atrial saturation is 75 %. 3. Pulmonary artery saturation is 78 %. 4. Aortic saturation is 93 %.  USING MODIFIED FICK METHOD: Cardiac Output is 13 L/min. Cardiac index is 6.3 L/min/m-2 SVR is 800 dsc-5 PVR is 1.6 Woods Units.  ANGIOGRAPHIC DATA:  Left main coronary artery: Large caliber vessel. Angiographically, no evidence of obstructive disease.  Left anterior descending coronary artery (LAD): Large caliber vessel, giving rise to Septal perforators and a large diagonal. LAD wraps around the apex. Angiographically, no evidence of  obstructive disease.  Left circumflex coronary artery (LCx): Large caliber vessel which gives off a large OM1 branch before  continuing as AV grrove LCx. OM1 bifurcates into superior and inferior branches. There is no evidence of obstructive disease.  Right coronary artery (RCA): Large caliber vessel. Dominant. Distally bifurcates into PDA and PLV. Angiographically, no evidence of obstructive disease.  IMPRESSION: No evidence of epicardial CAD. Moderate post capillary pulmonary HTN.  RECOMMENDATIONS: Continue aggressive risk factor modifications for prevention of CAD. Management of pulmonary hypertension per primary cardiology team.  The results of the procedure were discussed with the patient.  Jennette Bill, MD was present and supervised during the entire procedure    Patient Profile     47 y.o. male with ESRD, HTN, DM, gastroparesis admitted with fever, cough, nausea, vomiting. Found to have RML pneumonia and newly diagnosed decreased LVEF. Med management complicated by nausea, vomiting, gastroparesis. TSH also abnormal recommended for OP repeat. Of note, patient was following at [facility] recently (had moved to [facility]) and had OV with cardiology there 10/2022, EF 50-55% by echo then, recommended for [facility] in preparation for renal transplant eval, do not see completed.    Current Reported Medications:.    No outpatient medications have been marked as taking for the 01/25/23 encounter (Appointment) with Laurann Montana, PA-C.    Physical Exam:    VS:  There were no vitals taken for this visit.   Wt Readings from Last 3 Encounters:  01/11/23 190 lb 11.2 oz (86.5 kg)  12/22/22 202 lb 13.2 oz (92 kg)  11/07/22 207 lb (93.9 kg)    GEN: Well nourished, well developed in no acute distress NECK: No JVD; No carotid bruits CARDIAC: ***RRR, no murmurs, rubs, gallops RESPIRATORY:  Clear to auscultation without rales, wheezing or rhonchi  ABDOMEN: Soft, non-tender, non-distended EXTREMITIES:  No edema; No acute deformity   Asessement and Plan:.     ***     Disposition: F/u with ***  Signed, Laurann Montana,  PA-C

## 2023-01-24 NOTE — Progress Notes (Unsigned)
Cardiology Office Note    Date:  01/25/2023  ID:  [name], DOB [birth year], MRN [id number] PCP:  Starla Link, MD  Cardiologist:  None  Electrophysiologist:  None    History of Present Illness: [name]    [name] is a 47 y.o. male with visit-pertinent history of ESRD, HTN, DM, gastroparesis, mild cardiomyopathy seen for follow-up.  He was previously following at [facility] for possible transplant workup. 2D echo showed EF 50-55% in 10/2022, underwent cath (outlinedbelow) in 10/2022 with without significant CAD. He was recently admitted to our hospital with fever, cough, nausea, vomiting. Found to have RML pneumonia and newly diagnosed decreased LVEF, possibly stress induced versus related to hypertension. 2d Echo 01/06/23 showed EF 45-50% with global HK, G2DD, mildly enlarged RV, trivial MR, mildly dilated PA, dilated IVC. He did not previously tolerate carvedilol due to fatigue but was doing well back on it in the hospital. He was also started on Entresto, OK'd by nephro. TSH was abnormal with normal FT4.  ? Transplant status with decline in EF, plan for repeat echo  Mild cardiomyopathy Essential HTN ESRD on HD Abnormal thyroid function test (TSH)  He felt nausea this morning and his head was hurting and dizziness when he laid flat. Some vomiting this morning but just clear liquids. He took two tylenol for pain and that did not come up. This was the first time he was sick since the hospital. He had dialysis yesterday and he thinks this might have something to do with this.  He took his medications this morning about 1 hour ago. He does his dialysis at home. He has BP cuffs at home to check his pressure. I encouraged him to keep track at home and call if diastolic continues to be elevated.  December he will need an echo.   Labwork independently reviewed: 12/2022 Hgb 10.9, plt 191, K 4.0, Cr 10.47, LDL 46, trig 116, TSH 16 with normal FT4, alb 3.2, AST 12, ALT OK  ROS: .    Please  see the history of present illness.  All other systems are reviewed and otherwise negative.  Studies Reviewed: [name]    EKG:  Not ordered today  CV Studies: Cardiac studies reviewed are outlined and summarized above. Otherwise please see EMR for full report.  2d echo 12/2022   1. Left ventricular ejection fraction, by estimation, is 40 to 45%. The  left ventricle has mildly decreased function. The left ventricle  demonstrates global hypokinesis. There is moderate left ventricular  hypertrophy. Left ventricular diastolic  parameters are consistent with Grade II diastolic dysfunction  (pseudonormalization).   2. Right ventricular systolic function is normal. The right ventricular  size is mildly enlarged. Tricuspid regurgitation signal is inadequate for  assessing PA pressure.   3. Left atrial size was mildly dilated.   4. The mitral valve is normal in structure. Trivial mitral valve  regurgitation. No evidence of mitral stenosis.   5. The aortic valve is tricuspid. Aortic valve regurgitation is not  visualized. No aortic stenosis is present.   6. Mildly dilated pulmonary artery.   7. The inferior vena cava is dilated in size with <50% respiratory  variability, suggesting right atrial pressure of 15 mmHg.    CareEverywhere 10/2022  Procedure: A two-dimensional transthoracic echocardiogram with color flow and Doppler was performed. Study Quality: Fair.  Left Ventricle: The left ventricle is normal in size. There is mild concentric left ventricular hypertrophy. Ejection Fraction = 50-55%. The left ventricular wall  motion is normal. Diastolic dysfunction, Grade III (restrictive pattern), consistent with markedly increased left atrial pressure.  Left Atrium: The left atrium is mildly dilated. Left atrial index is 5ml/m^2.  Right Atrium: Right atrial size is normal. 18cm2  Right Ventricle: The right ventricle is normal size. The right ventricular systolic function is  normal.  Aortic Valve: The aortic valve is trileaflet. The aortic valve opens well. Trace (trivial) aortic regurgitation.  Mitral Valve: The mitral valve leaflets appear normal. There is no evidence of stenosis, fluttering, or prolapse. There is mild mitral regurgitation.  Tricuspid Valve: Structurally normal tricuspid valve. There is mild tricuspid regurgitation. Estimated right atrial pressure is 5 mmHg.[name] RVSP not able to be calculated.  Pulmonic Valve: Structurally normal pulmonic valve. Mild pulmonic valvular regurgitation.  Arteries: The aortic root is normal size. Borderline dilated ascending aorta.  Venous: Mildly dilated inferior vena cava.  Pericardium/Pleura: Trivial pericardial effusion.   Cath 10/2022  INTERVENTIONAL CARDIOLOGIST: Jennette Bill, MD GENERAL CARDIOLOGY FELLOW: Threasa Alpha, MD INTERVENTIONAL FELLOW: None DATE OF PROCEDURE: 11/17/22  PROCEDURES: Ultrasound-guided Right Femoral artery access Ultrasound-guided Right Femoral vein access Selective Left and right coronary system angiography Right heart catheterization Conscious sedation monitoring time: 75 minutes  INDICATIONS: Pre-operative evaluation  HISTORY: [name] is a 47yrs old Male with past medical history of HTN, DM-2, ESRD on home dialysis was seen in the cardiology clinic for a pre operative cardiac risk stratification ahead of being listed for a renal transplant . He was brought to the cardiac catheterization lab for right heart catheterization and coronary angiography and possible revascularization.  CONSENT: After the risk and benefits were explained to the patient, he was able to reiterate the risk and benefits along with the details of the procedure and consented to the procedure.  CATHETERIZATION LABORATORY STATISTICS: Total contrast used for this case was 50 cc of Visipaque contrast. Total fluoroscopic time for this case was 13.8 minutes. Total dose area  product was 2850 cGycm2 Cumulative Air Kerma was 301 mGy Blood Loss: Less than 10mL  DESCRIPTION OF PROCEDURE: The patient was brought to the Cardiac Catheterization Laboratory in stable fasting condition. After informed consent was obtained and a WHO time out was performed with the entire cath lab team present before commencing the procedure. Local anesthesia administered with 2% Lidocaine solution subcutaneously.The patient was provided with conscious sedation with a total of 1mg  intravenous Versed. Arterial access was achieved with a micro puncture under ultrasound guidance and a 6 Fr Ultrasound guided Right Femoral artery sheath was inserted. Venous access was achieved with a micro puncture under ultrasound guidance and a 7 Fr Ultrasound guided Right Femoral vein sheath was inserted. Right heart catheterization was performed with a 7 Fr Arrow thermodilution catheter A 6 Fr JL 4.0 catheter was used to engage the left coronary artery. A 6 Fr JR 4.0 catheter was used to engage the right coronary artery. The LV cavity was not entered. Multiple angiographic views were obtained to delineate the coronary anatomy. 0.035" 145cm J wire Guidewires were used during the procedure. Arterial hemostasis was achieved with Manual compression. Venous hemostasis was achieved with manual compression. All catheters and wires were removed at the end of the procedure.   HEMODYNAMIC DATA: Aortic blood pressure is 185/96 mmHg with a mean of 138 mmHg. Right atrial pressure mean is 8 mmHg. Right ventricular pressure is 62/10 mmHg Pulmonary arterial pressure is 56/22 mmHg with mean of 42 mmHg. Pulmonary wedge catheter pressure mean is 21 mmHg. V wave is 32 mmHg.  SATURATION DATA: 1. Hemoglobin is 10 g/dL. 2. Right atrial saturation is 75 %. 3. Pulmonary artery saturation is 78 %. 4. Aortic saturation is 93 %.  USING MODIFIED FICK METHOD: Cardiac Output is 13 L/min. Cardiac index is 6.3 L/min/m-2 SVR is 800 dsc-5 PVR  is 1.6 Woods Units.  ANGIOGRAPHIC DATA:  Left main coronary artery: Large caliber vessel. Angiographically, no evidence of obstructive disease.  Left anterior descending coronary artery (LAD): Large caliber vessel, giving rise to Septal perforators and a large diagonal. LAD wraps around the apex. Angiographically, no evidence of obstructive disease.  Left circumflex coronary artery (LCx): Large caliber vessel which gives off a large OM1 branch before continuing as AV grrove LCx. OM1 bifurcates into superior and inferior branches. There is no evidence of obstructive disease.  Right coronary artery (RCA): Large caliber vessel. Dominant. Distally bifurcates into PDA and PLV. Angiographically, no evidence of obstructive disease.  IMPRESSION: No evidence of epicardial CAD. Moderate post capillary pulmonary HTN.  RECOMMENDATIONS: Continue aggressive risk factor modifications for prevention of CAD. Management of pulmonary hypertension per primary cardiology team.  The results of the procedure were discussed with the patient.  Jennette Bill, MD was present and supervised during the entire procedure    Patient Profile     47 y.o. male with ESRD, HTN, DM, gastroparesis admitted with fever, cough, nausea, vomiting. Found to have RML pneumonia and newly diagnosed decreased LVEF. Med management complicated by nausea, vomiting, gastroparesis. TSH also abnormal recommended for OP repeat. Of note, patient was following at [facility] recently (had moved to [facility]) and had OV with cardiology there 10/2022, EF 50-55% by echo then, recommended for [facility] in preparation for renal transplant eval, do not see completed.    Current Reported Medications:.    Current Meds  Medication Sig   calcitRIOL (ROCALTROL) 0.5 MCG capsule Take 0.5 mcg by mouth daily.   carvedilol (COREG) 25 MG tablet Take 1 tablet (25 mg total) by mouth 2 (two) times daily with a meal.   Cyanocobalamin (VITAMIN B-12 PO) Take 1 tablet by  mouth daily.   ibuprofen (ADVIL) 200 MG tablet Take 400 mg by mouth 2 (two) times daily as needed for headache or moderate pain.   metoCLOPramide (REGLAN) 5 MG tablet Take 1 tablet (5 mg total) by mouth every 6 (six) hours as needed for nausea or vomiting.   pantoprazole (PROTONIX) 40 MG tablet Take 1 tablet (40 mg total) by mouth 2 (two) times daily before a meal.   sacubitril-valsartan (ENTRESTO) 24-26 MG Take 1 tablet by mouth 2 (two) times daily.    Physical Exam:    VS:  BP (!) 148/94   Pulse 82   Ht 5\' 9"  (1.753 m)   Wt 207 lb (93.9 kg)   SpO2 99%   BMI 30.57 kg/m    Wt Readings from Last 3 Encounters:  01/25/23 207 lb (93.9 kg)  01/11/23 190 lb 11.2 oz (86.5 kg)  12/22/22 202 lb 13.2 oz (92 kg)    GEN: Well nourished, well developed in no acute distress NECK: No JVD; No carotid bruits CARDIAC: RRR, no murmurs, rubs, gallops RESPIRATORY:  Clear to auscultation without rales, wheezing or rhonchi  ABDOMEN: Soft, non-tender, non-distended EXTREMITIES:  No edema; No acute deformity   Asessement and Plan:[name]     Hypertension -Blood pressure 148/94 -Patient tells me that his blood pressure is usually elevated at the doctor's office -He had dialysis yesterday and did not feel well this morning -When continue current medications  and I suggested keeping track at home an hour after morning medicines of his blood pressure and if it remains elevated we can always increase his Entresto on nondialysis days -Low-sodium, heart healthy, renal diet advised  ESRD -Creatinine 10.47, on dialysis at home -He keeps meticulous track of his blood pressure while in session and out of session  Mild CM -Recent echocardiogram reviewed -No medication changes made today -Would encourage continuing carvedilol 25 mg twice a day, Entresto 24-26 mg twice a day with plans for follow-up echocardiogram in a few months      Disposition: F/u with Dr. Izora Ribas in 3-4 months  Signed, Sharlene Dory, PA-C

## 2023-01-25 ENCOUNTER — Ambulatory Visit: Payer: PRIVATE HEALTH INSURANCE | Admitting: Physician Assistant

## 2023-01-25 ENCOUNTER — Encounter: Payer: Self-pay | Admitting: Physician Assistant

## 2023-01-25 ENCOUNTER — Ambulatory Visit: Payer: PRIVATE HEALTH INSURANCE | Attending: Physician Assistant | Admitting: Physician Assistant

## 2023-01-25 VITALS — BP 148/94 | HR 82 | Ht 69.0 in | Wt 207.0 lb

## 2023-01-25 DIAGNOSIS — N186 End stage renal disease: Secondary | ICD-10-CM

## 2023-01-25 DIAGNOSIS — K3184 Gastroparesis: Secondary | ICD-10-CM | POA: Diagnosis not present

## 2023-01-25 DIAGNOSIS — R931 Abnormal findings on diagnostic imaging of heart and coronary circulation: Secondary | ICD-10-CM | POA: Diagnosis not present

## 2023-01-25 DIAGNOSIS — I10 Essential (primary) hypertension: Secondary | ICD-10-CM | POA: Diagnosis not present

## 2023-01-25 NOTE — Patient Instructions (Signed)
Medication Instructions:   Your physician recommends that you continue on your current medications as directed. Please refer to the Current Medication list given to you today.  *If you need a refill on your cardiac medications before your next appointment, please call your pharmacy*   Lab Work: NONE ORDERED  TODAY   If you have labs (blood work) drawn today and your tests are completely normal, you will receive your results only by: MyChart Message (if you have MyChart) OR A paper copy in the mail If you have any lab test that is abnormal or we need to change your treatment, we will call you to review the results.   Testing/Procedures:  Your physician has requested that you have an echocardiogram. Echocardiography is a painless test that uses sound waves to create images of your heart. It provides your doctor with information about the size and shape of your heart and how well your heart's chambers and valves are working. This procedure takes approximately one hour. There are no restrictions for this procedure. Please do NOT wear cologne, perfume, aftershave, or lotions (deodorant is allowed). Please arrive 15 minutes prior to your appointment time.     Follow-Up: At [facility], you and your health needs are our priority.  As part of our continuing mission to provide you with exceptional heart care, we have created designated Provider Care Teams.  These Care Teams include your primary Cardiologist (physician) and Advanced Practice Providers (APPs -  Physician Assistants and Nurse Practitioners) who all work together to provide you with the care you need, when you need it.  We recommend signing up for the patient portal called "MyChart".  Sign up information is provided on this After Visit Summary.  MyChart is used to connect with patients for Virtual Visits (Telemedicine).  Patients are able to view lab/test results, encounter notes, upcoming appointments, etc.  Non-urgent  messages can be sent to your provider as well.   To learn more about what you can do with MyChart, go to [email or web address].    Your next appointment:   3 -4 month(s)  Provider:  Izora Ribas  Other Instructions  Heart-Healthy Eating Plan Eating a healthy diet is important for the health of your heart. A heart-healthy eating plan includes: Eating less unhealthy fats. Eating more healthy fats. Eating less salt in your food. Salt is also called sodium. Making other changes in your diet. Talk with your doctor or a diet specialist (dietitian) to create an eating plan that is right for you. What is my plan? Your doctor may recommend an eating plan that includes: Total fat: ______% or less of total calories a day. Saturated fat: ______% or less of total calories a day. Cholesterol: less than _________mg a day. Sodium: less than _________mg a day. What are tips for following this plan? Cooking Avoid frying your food. Try to bake, boil, grill, or broil it instead. You can also reduce fat by: Removing the skin from poultry. Removing all visible fats from meats. Steaming vegetables in water or broth. Meal planning  At meals, divide your plate into four equal parts: Fill one-half of your plate with vegetables and green salads. Fill one-fourth of your plate with whole grains. Fill one-fourth of your plate with lean protein foods. Eat 2-4 cups of vegetables per day. One cup of vegetables is: 1 cup (91 g) broccoli or cauliflower florets. 2 medium carrots. 1 large bell pepper. 1 large sweet potato. 1 large tomato. 1 medium white potato. 2 cups (150  g) raw leafy greens. Eat 1-2 cups of fruit per day. One cup of fruit is: 1 small apple 1 large banana 1 cup (237 g) mixed fruit, 1 large orange,  cup (82 g) dried fruit, 1 cup (240 mL) 100% fruit juice. Eat more foods that have soluble fiber. These are apples, broccoli, carrots, beans, peas, and barley. Try to get 20-30 g of  fiber per day. Eat 4-5 servings of nuts, legumes, and seeds per week: 1 serving of dried beans or legumes equals  cup (90 g) cooked. 1 serving of nuts is  oz (12 almonds, 24 pistachios, or 7 walnut halves). 1 serving of seeds equals  oz (8 g). General information Eat more home-cooked food. Eat less restaurant, buffet, and fast food. Limit or avoid alcohol. Limit foods that are high in starch and sugar. Avoid fried foods. Lose weight if you are overweight. Keep track of how much salt (sodium) you eat. This is important if you have high blood pressure. Ask your doctor to tell you more about this. Try to add vegetarian meals each week. Fats Choose healthy fats. These include olive oil and canola oil, flaxseeds, walnuts, almonds, and seeds. Eat more omega-3 fats. These include salmon, mackerel, sardines, tuna, flaxseed oil, and ground flaxseeds. Try to eat fish at least 2 times each week. Check food labels. Avoid foods with trans fats or high amounts of saturated fat. Limit saturated fats. These are often found in animal products, such as meats, butter, and cream. These are also found in plant foods, such as palm oil, palm kernel oil, and coconut oil. Avoid foods with partially hydrogenated oils in them. These have trans fats. Examples are stick margarine, some tub margarines, cookies, crackers, and other baked goods. What foods should I eat? Fruits All fresh, canned (in natural juice), or frozen fruits. Vegetables Fresh or frozen vegetables (raw, steamed, roasted, or grilled). Green salads. Grains Most grains. Choose whole wheat and whole grains most of the time. Rice and pasta, including brown rice and pastas made with whole wheat. Meats and other proteins Lean, well-trimmed beef, veal, pork, and lamb. Chicken and [place] without skin. All fish and shellfish. Wild duck, rabbit, pheasant, and venison. Egg whites or low-cholesterol egg substitutes. Dried beans, peas, lentils, and tofu.  Seeds and most nuts. Dairy Low-fat or nonfat cheeses, including ricotta and mozzarella. Skim or 1% milk that is liquid, powdered, or evaporated. Buttermilk that is made with low-fat milk. Nonfat or low-fat yogurt. Fats and oils Non-hydrogenated (trans-free) margarines. Vegetable oils, including soybean, sesame, sunflower, olive, peanut, safflower, corn, canola, and cottonseed. Salad dressings or mayonnaise made with a vegetable oil. Beverages Mineral water. Coffee and tea. Diet carbonated beverages. Sweets and desserts Sherbet, gelatin, and fruit ice. Small amounts of dark chocolate. Limit all sweets and desserts. Seasonings and condiments All seasonings and condiments. The items listed above may not be a complete list of foods and drinks you can eat. Contact a dietitian for more options. What foods should I avoid? Fruits Canned fruit in heavy syrup. Fruit in cream or butter sauce. Fried fruit. Limit coconut. Vegetables Vegetables cooked in cheese, cream, or butter sauce. Fried vegetables. Grains Breads that are made with saturated or trans fats, oils, or whole milk. Croissants. Sweet rolls. Donuts. High-fat crackers, such as cheese crackers. Meats and other proteins Fatty meats, such as hot dogs, ribs, sausage, bacon, rib-eye roast or steak. High-fat deli meats, such as salami and bologna. Caviar. Domestic duck and goose. Organ meats, such as liver.  Dairy Cream, sour cream, cream cheese, and creamed cottage cheese. Whole-milk cheeses. Whole or 2% milk that is liquid, evaporated, or condensed. Whole buttermilk. Cream sauce or high-fat cheese sauce. Yogurt that is made from whole milk. Fats and oils Meat fat, or shortening. Cocoa butter, hydrogenated oils, palm oil, coconut oil, palm kernel oil. Solid fats and shortenings, including bacon fat, salt pork, lard, and butter. Nondairy cream substitutes. Salad dressings with cheese or sour cream. Beverages Regular sodas and juice drinks with  added sugar. Sweets and desserts Frosting. Pudding. Cookies. Cakes. Pies. Milk chocolate or white chocolate. Buttered syrups. Full-fat ice cream or ice cream drinks. The items listed above may not be a complete list of foods and drinks to avoid. Contact a dietitian for more information. Summary Heart-healthy meal planning includes eating less unhealthy fats, eating more healthy fats, and making other changes in your diet. Eat a balanced diet. This includes fruits and vegetables, low-fat or nonfat dairy, lean protein, nuts and legumes, whole grains, and heart-healthy oils and fats. This information is not intended to replace advice given to you by your health care provider. Make sure you discuss any questions you have with your health care provider. Document Revised: 05/17/2021 Document Reviewed: 05/17/2021 Elsevier Patient Education  2024 [employer]   Low-Sodium Eating Plan Salt (sodium) helps you keep a healthy balance of fluids in your body. Too much sodium can raise your blood pressure. It can also cause fluid and waste to be held in your body. Your health care provider or dietitian may recommend a low-sodium eating plan if you have high blood pressure (hypertension), kidney disease, liver disease, or heart failure. Eating less sodium can help lower your blood pressure and reduce swelling. It can also protect your heart, liver, and kidneys. What are tips for following this plan? Reading food labels  Check food labels for the amount of sodium per serving. If you eat more than one serving, you must multiply the listed amount by the number of servings. Choose foods with less than 140 milligrams (mg) of sodium per serving. Avoid foods with 300 mg of sodium or more per serving. Always check how much sodium is in a product, even if the label says "unsalted" or "no salt added." Shopping  Buy products labeled as "low-sodium" or "no salt added." Buy fresh foods. Avoid canned foods and  pre-made or frozen meals. Avoid canned, cured, or processed meats. Buy breads that have less than 80 mg of sodium per slice. Cooking  Eat more home-cooked food. Try to eat less restaurant, buffet, and fast food. Try not to add salt when you cook. Use salt-free seasonings or herbs instead of table salt or sea salt. Check with your provider or pharmacist before using salt substitutes. Cook with plant-based oils, such as canola, sunflower, or olive oil. Meal planning When eating at a restaurant, ask if your food can be made with less salt or no salt. Avoid dishes labeled as brined, pickled, cured, or smoked. Avoid dishes made with soy sauce, miso, or teriyaki sauce. Avoid foods that have monosodium glutamate (MSG) in them. MSG may be added to some restaurant food, sauces, soups, bouillon, and canned foods. Make meals that can be grilled, baked, poached, roasted, or steamed. These are often made with less sodium. General information Try to limit your sodium intake to 1,500-2,300 mg each day, or the amount told by your provider. What foods should I eat? Fruits Fresh, frozen, or canned fruit. Fruit juice. Vegetables Fresh or frozen vegetables. "  No salt added" canned vegetables. "No salt added" tomato sauce and paste. Low-sodium or reduced-sodium tomato and vegetable juice. Grains Low-sodium cereals, such as oats, puffed wheat and rice, and shredded wheat. Low-sodium crackers. Unsalted rice. Unsalted pasta. Low-sodium bread. Whole grain breads and whole grain pasta. Meats and other proteins Fresh or frozen meat, poultry, seafood, and fish. These should have no added salt. Low-sodium canned tuna and salmon. Unsalted nuts. Dried peas, beans, and lentils without added salt. Unsalted canned beans. Eggs. Unsalted nut butters. Dairy Milk. Soy milk. Cheese that is naturally low in sodium, such as ricotta cheese, fresh mozzarella, or Swiss cheese. Low-sodium or reduced-sodium cheese. Cream cheese.  Yogurt. Seasonings and condiments Fresh and dried herbs and spices. Salt-free seasonings. Low-sodium mustard and ketchup. Sodium-free salad dressing. Sodium-free light mayonnaise. Fresh or refrigerated horseradish. Lemon juice. Vinegar. Other foods Homemade, reduced-sodium, or low-sodium soups. Unsalted popcorn and pretzels. Low-salt or salt-free chips. The items listed above may not be all the foods and drinks you can have. Talk to a dietitian to learn more. What foods should I avoid? Vegetables Sauerkraut, pickled vegetables, and relishes. Olives. [place] fries. Onion rings. Regular canned vegetables, except low-sodium or reduced-sodium items. Regular canned tomato sauce and paste. Regular tomato and vegetable juice. Frozen vegetables in sauces. Grains Instant hot cereals. Bread stuffing, pancake, and biscuit mixes. Croutons. Seasoned rice or pasta mixes. Noodle soup cups. Boxed or frozen macaroni and cheese. Regular salted crackers. Self-rising flour. Meats and other proteins Meat or fish that is salted, canned, smoked, spiced, or pickled. Precooked or cured meat, such as sausages or meat loaves. Tomasa Blase. Ham. Pepperoni. Hot dogs. Corned beef. Chipped beef. Salt pork. Jerky. Pickled herring, anchovies, and sardines. Regular canned tuna. Salted nuts. Dairy Processed cheese and cheese spreads. Hard cheeses. Cheese curds. Blue cheese. Feta cheese. String cheese. Regular cottage cheese. Buttermilk. Canned milk. Fats and oils Salted butter. Regular margarine. Ghee. Bacon fat. Seasonings and condiments Onion salt, garlic salt, seasoned salt, table salt, and sea salt. Canned and packaged gravies. Worcestershire sauce. Tartar sauce. Barbecue sauce. Teriyaki sauce. Soy sauce, including reduced-sodium soy sauce. Steak sauce. Fish sauce. Oyster sauce. Cocktail sauce. Horseradish that you find on the shelf. Regular ketchup and mustard. Meat flavorings and tenderizers. Bouillon cubes. Hot sauce. Pre-made or  packaged marinades. Pre-made or packaged taco seasonings. Relishes. Regular salad dressings. Salsa. Other foods Salted popcorn and pretzels. Corn chips and puffs. Potato and tortilla chips. Canned or dried soups. Pizza. Frozen entrees and pot pies. The items listed above may not be all the foods and drinks you should avoid. Talk to a dietitian to learn more. This information is not intended to replace advice given to you by your health care provider. Make sure you discuss any questions you have with your health care provider. Document Revised: 04/28/2022 Document Reviewed: 04/28/2022 Elsevier Patient Education  2024 [employer].

## 2023-02-16 ENCOUNTER — Other Ambulatory Visit (HOSPITAL_COMMUNITY): Payer: PRIVATE HEALTH INSURANCE

## 2023-02-24 ENCOUNTER — Ambulatory Visit (HOSPITAL_COMMUNITY): Payer: BC Managed Care – PPO | Attending: Cardiology

## 2023-02-24 DIAGNOSIS — R931 Abnormal findings on diagnostic imaging of heart and coronary circulation: Secondary | ICD-10-CM | POA: Insufficient documentation

## 2023-02-24 LAB — ECHOCARDIOGRAM COMPLETE
Area-P 1/2: 5.97 cm2
S' Lateral: 3.8 cm

## 2023-02-27 ENCOUNTER — Other Ambulatory Visit: Payer: Self-pay | Admitting: Physician Assistant

## 2023-03-20 ENCOUNTER — Telehealth: Payer: Self-pay

## 2023-03-20 DIAGNOSIS — I517 Cardiomegaly: Secondary | ICD-10-CM

## 2023-03-20 DIAGNOSIS — R9431 Abnormal electrocardiogram [ECG] [EKG]: Secondary | ICD-10-CM

## 2023-03-20 NOTE — Telephone Encounter (Signed)
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  We will order the appropriate lab tests for diagnosis.  Your heart pump function was 35 to 40% which is lower than it was on your last echocardiogram.  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  No significant valvular disease.  Triage: can we order a PYP scan, SPEP, and UPEP?  Thanks! Sharlene Dory, PA-C

## 2023-03-20 NOTE — Telephone Encounter (Signed)
Patient aware via MyChart. Orders have been placed.

## 2023-04-11 ENCOUNTER — Telehealth (HOSPITAL_COMMUNITY): Payer: Self-pay | Admitting: *Deleted

## 2023-04-11 ENCOUNTER — Encounter (HOSPITAL_COMMUNITY): Payer: Self-pay

## 2023-04-11 NOTE — Telephone Encounter (Signed)
Attempted to reach pt to discuss amyloid study. Mailbox was full. Sent pt letter on my chart.

## 2023-04-13 ENCOUNTER — Ambulatory Visit (HOSPITAL_COMMUNITY): Payer: BC Managed Care – PPO

## 2023-04-17 ENCOUNTER — Ambulatory Visit (HOSPITAL_COMMUNITY): Payer: BC Managed Care – PPO | Attending: Physician Assistant

## 2023-04-17 DIAGNOSIS — R931 Abnormal findings on diagnostic imaging of heart and coronary circulation: Secondary | ICD-10-CM | POA: Diagnosis present

## 2023-04-17 DIAGNOSIS — I420 Dilated cardiomyopathy: Secondary | ICD-10-CM | POA: Insufficient documentation

## 2023-04-17 DIAGNOSIS — I517 Cardiomegaly: Secondary | ICD-10-CM | POA: Diagnosis not present

## 2023-04-17 DIAGNOSIS — R9431 Abnormal electrocardiogram [ECG] [EKG]: Secondary | ICD-10-CM | POA: Insufficient documentation

## 2023-04-17 LAB — MYOCARDIAL AMYLOID PLANAR & SPECT: H/CL Ratio: 1.1

## 2023-04-17 MED ORDER — TECHNETIUM TC 99M PYROPHOSPHATE
21.8000 | Freq: Once | INTRAVENOUS | Status: AC
  Administered 2023-04-17: 21.8 via INTRAVENOUS

## 2023-05-08 NOTE — Progress Notes (Signed)
 Cardiology Office Note    Date:  05/10/2023  ID:  [name], DOB 1975/05/28, MRN [id number] PCP:  [name], MD  Cardiologist:  Jann Melody, MD  Electrophysiologist:  None    History of Present Illness: [name]    [name] is a 49 y.o. male with visit-pertinent history of ESRD, HTN, DM, gastroparesis, mild cardiomyopathy seen for follow-up.  He was previously following at [facility] for possible transplant workup. 2D echo showed EF 50-55% in 10/2022, underwent cath (outlinedbelow) in 10/2022 with without significant CAD. He was recently admitted to our hospital with fever, cough, nausea, vomiting. Found to have RML pneumonia and newly diagnosed decreased LVEF, possibly stress induced versus related to hypertension. 2d Echo 01/06/23 showed EF 45-50% with global HK, G2DD, mildly enlarged RV, trivial MR, mildly dilated PA, dilated IVC. He did not previously tolerate carvedilol  due to fatigue but was doing well back on it in the hospital. He was also started on Entresto , OK'd by nephro. TSH was abnormal with normal FT4.  ? Transplant status with decline in EF, plan for repeat echo  Mild cardiomyopathy Essential HTN ESRD on HD Abnormal thyroid  function test (TSH)  He was seen by me 10/2, he felt nausea this morning and his head was hurting and dizziness when he laid flat. Some vomiting this morning but just clear liquids. He took two tylenol  for pain and that did not come up. This was the first time he was sick since the hospital. He had dialysis yesterday and he thinks this might have something to do with this.  He took his medications this morning about 1 hour ago. He does his dialysis at home. He has BP cuffs at home to check his pressure. I encouraged him to keep track at home and call if diastolic continues to be elevated.  Echocardiogram ordered 11/4 showing possible amyloidosis with EF 35 to 40%.  Restrictive diastolic dysfunction.  PYP scan, SPEP, and UPEP  ordered.  Today, he presents with a history of hypertension and on home dialysis, with ongoing issues of high blood pressure. He reports that his blood pressure readings at home have been consistently high, mirroring the readings taken during the clinic visit. The patient attributes his high blood pressure to a combination of factors, including stress and financial constraints that affect his ability to consistently take his prescribed medications.  The patient's stress levels are reportedly high due to personal circumstances, including a challenging home environment and financial difficulties. He expresses a desire to [place]. The patient also mentions that he finds relief from stress through activities such as shooting guns and playing golf, which he is unable to do in his current city residence.  The patient's financial constraints are a significant barrier to medication compliance. He reports struggling to afford his medications, particularly his Entresto  prescription, which is a key part of his hypertension management. The patient is open to adjusting his medication regimen to make it more affordable and manageable.  Reports no shortness of breath nor dyspnea on exertion. Reports no chest pain, pressure, or tightness. No edema, orthopnea, PND. Reports no palpitations.   Discussed the use of AI scribe software for clinical note transcription with the patient, who gave verbal consent to proceed.  Labwork independently reviewed: 12/2022 Hgb 10.9, plt 191, K 4.0, Cr 10.47, LDL 46, trig 116, TSH 16 with normal FT4, alb 3.2, AST 12, ALT OK  ROS: .  Please see the history of present illness.  All other systems are reviewed and otherwise negative.  Studies Reviewed: [name]    EKG:  Not ordered today  CV Studies: Cardiac studies reviewed are outlined and summarized above. Otherwise please see EMR for full report.  2d  echo 12/2022   1. Left ventricular ejection fraction, by estimation, is 40 to 45%. The  left ventricle has mildly decreased function. The left ventricle  demonstrates global hypokinesis. There is moderate left ventricular  hypertrophy. Left ventricular diastolic  parameters are consistent with Grade II diastolic dysfunction  (pseudonormalization).   2. Right ventricular systolic function is normal. The right ventricular  size is mildly enlarged. Tricuspid regurgitation signal is inadequate for  assessing PA pressure.   3. Left atrial size was mildly dilated.   4. The mitral valve is normal in structure. Trivial mitral valve  regurgitation. No evidence of mitral stenosis.   5. The aortic valve is tricuspid. Aortic valve regurgitation is not  visualized. No aortic stenosis is present.   6. Mildly dilated pulmonary artery.   7. The inferior vena cava is dilated in size with <50% respiratory  variability, suggesting right atrial pressure of 15 mmHg.    CareEverywhere 10/2022  Procedure: A two-dimensional transthoracic echocardiogram with color flow and Doppler was performed. Study Quality: Fair.  Left Ventricle: The left ventricle is normal in size. There is mild concentric left ventricular hypertrophy. Ejection Fraction = 50-55%. The left ventricular wall motion is normal. Diastolic dysfunction, Grade III (restrictive pattern), consistent with markedly increased left atrial pressure.  Left Atrium: The left atrium is mildly dilated. Left atrial index is 56ml/m^2.  Right Atrium: Right atrial size is normal. 18cm2  Right Ventricle: The right ventricle is normal size. The right ventricular systolic function is normal.  Aortic Valve: The aortic valve is trileaflet. The aortic valve opens well. Trace (trivial) aortic regurgitation.  Mitral Valve: The mitral valve leaflets appear normal. There is no evidence of stenosis, fluttering, or prolapse. There is mild mitral  regurgitation.  Tricuspid Valve: Structurally normal tricuspid valve. There is mild tricuspid regurgitation. Estimated right atrial pressure is 5 mmHg.[name] RVSP not able to be calculated.  Pulmonic Valve: Structurally normal pulmonic valve. Mild pulmonic valvular regurgitation.  Arteries: The aortic root is normal size. Borderline dilated ascending aorta.  Venous: Mildly dilated inferior vena cava.  Pericardium/Pleura: Trivial pericardial effusion.   Cath 10/2022  INTERVENTIONAL CARDIOLOGIST: Renold Cashing, MD GENERAL CARDIOLOGY FELLOW: Darla Edward, MD INTERVENTIONAL FELLOW: None DATE OF PROCEDURE: 11/17/22  PROCEDURES: Ultrasound-guided Right Femoral artery access Ultrasound-guided Right Femoral vein access Selective Left and right coronary system angiography Right heart catheterization Conscious sedation monitoring time: 75 minutes  INDICATIONS: Pre-operative evaluation  HISTORY: [name] is a 48yrs old Male with past medical history of HTN, DM-2, ESRD on home dialysis was seen in the cardiology clinic for a pre operative cardiac risk stratification ahead of being listed for a renal transplant . He was brought to the cardiac catheterization lab for right heart catheterization and coronary angiography and possible revascularization.  CONSENT: After the risk and benefits were explained to the patient, he was able to reiterate the risk and benefits along with the details of the procedure and consented to the procedure.  CATHETERIZATION LABORATORY STATISTICS: Total contrast used for this case was 50 cc of Visipaque contrast. Total fluoroscopic time for this case was 13.8 minutes. Total dose area product was 2850 cGycm2 Cumulative Air Kerma was 301 mGy Blood Loss: Less than 10mL  DESCRIPTION OF PROCEDURE: The patient was brought to the Cardiac Catheterization Laboratory in stable fasting condition. After informed consent was obtained and a WHO time out was  performed with the entire cath lab team present before commencing the procedure. Local anesthesia administered with 2% Lidocaine  solution subcutaneously.The patient was provided with conscious sedation with a total of 1mg  intravenous Versed. Arterial access was achieved with a micro puncture under ultrasound guidance and a 6 Fr Ultrasound guided Right Femoral artery sheath was inserted. Venous access was achieved with a micro puncture under ultrasound guidance and a 7 Fr Ultrasound guided Right Femoral vein sheath was inserted. Right heart catheterization was performed with a 7 Fr Arrow thermodilution catheter A 6 Fr JL 4.0 catheter was used to engage the left coronary artery. A 6 Fr JR 4.0 catheter was used to engage the right coronary artery. The LV cavity was not entered. Multiple angiographic views were obtained to delineate the coronary anatomy. 0.035" 145cm J wire Guidewires were used during the procedure. Arterial hemostasis was achieved with Manual compression. Venous hemostasis was achieved with manual compression. All catheters and wires were removed at the end of the procedure.   HEMODYNAMIC DATA: Aortic blood pressure is 185/96 mmHg with a mean of 138 mmHg. Right atrial pressure mean is 8 mmHg. Right ventricular pressure is 62/10 mmHg Pulmonary arterial pressure is 56/22 mmHg with mean of 42 mmHg. Pulmonary wedge catheter pressure mean is 21 mmHg. V wave is 32 mmHg.  SATURATION DATA: 1. Hemoglobin is 10 g/dL. 2. Right atrial saturation is 75 %. 3. Pulmonary artery saturation is 78 %. 4. Aortic saturation is 93 %.  USING MODIFIED FICK METHOD: Cardiac Output is 13 L/min. Cardiac index is 6.3 L/min/m-2 SVR is 800 dsc-5 PVR is 1.6 Woods Units.  ANGIOGRAPHIC DATA:  Left main coronary artery: Large caliber vessel. Angiographically, no evidence of obstructive disease.  Left anterior descending coronary artery (LAD): Large caliber vessel, giving rise to Septal perforators and a large  diagonal. LAD wraps around the apex. Angiographically, no evidence of obstructive disease.  Left circumflex coronary artery (LCx): Large caliber vessel which gives off a large OM1 branch before continuing as AV grrove LCx. OM1 bifurcates into superior and inferior branches. There is no evidence of obstructive disease.  Right coronary artery (RCA): Large caliber vessel. Dominant. Distally bifurcates into PDA and PLV. Angiographically, no evidence of obstructive disease.  IMPRESSION: No evidence of epicardial CAD. Moderate post capillary pulmonary HTN.  RECOMMENDATIONS: Continue aggressive risk factor modifications for prevention of CAD. Management of pulmonary hypertension per primary cardiology team.  The results of the procedure were discussed with the patient.  Renold Cashing, MD was present and supervised during the entire procedure    Patient Profile     48 y.o. male with ESRD, HTN, DM, gastroparesis admitted with fever, cough, nausea, vomiting. Found to have RML pneumonia and newly diagnosed decreased LVEF. Med management complicated by nausea, vomiting, gastroparesis. TSH also abnormal recommended for OP repeat. Of note, patient was following at [facility] recently (had moved to [facility]) and had OV with cardiology there 10/2022, EF 50-55% by echo then, recommended for [facility] in preparation for renal transplant eval, do not see completed.    Current Reported Medications:.    Current Meds  Medication Sig   calcitRIOL  (ROCALTROL ) 0.5 MCG capsule Take 0.5 mcg by mouth daily.   Cyanocobalamin  (VITAMIN B-12 PO) Take 1 tablet by mouth daily.   doxazosin  (CARDURA ) 2 MG tablet Take 2 mg by mouth at bedtime.  ibuprofen (ADVIL) 200 MG tablet Take 400 mg by mouth 2 (two) times daily as needed for headache or moderate pain.   metoCLOPramide  (REGLAN ) 5 MG tablet Take 1 tablet (5 mg total) by mouth every 6 (six) hours as needed for nausea or vomiting.   metoprolol  succinate (TOPROL -XL) 25 MG 24 hr  tablet Take 1 tablet by mouth daily.   pantoprazole  (PROTONIX ) 40 MG tablet Take 1 tablet (40 mg total) by mouth 2 (two) times daily before a meal.   sacubitril -valsartan  (ENTRESTO ) 49-51 MG Take 1 tablet by mouth 2 (two) times daily.   [DISCONTINUED] sacubitril -valsartan  (ENTRESTO ) 24-26 MG Take 1 tablet by mouth 2 (two) times daily.    Physical Exam:    VS:  BP (!) 144/88 (BP Location: Left Arm, Patient Position: Sitting)   Pulse 85   Ht 5\' 9"  (1.753 m)   Wt 197 lb 12.8 oz (89.7 kg)   SpO2 99%   BMI 29.21 kg/m    Wt Readings from Last 3 Encounters:  05/10/23 197 lb 12.8 oz (89.7 kg)  01/25/23 207 lb (93.9 kg)  01/11/23 190 lb 11.2 oz (86.5 kg)    GEN: Well nourished, well developed in no acute distress NECK: No JVD; No carotid bruits CARDIAC: RRR, no murmurs, rubs, gallops RESPIRATORY:  Clear to auscultation without rales, wheezing or rhonchi  ABDOMEN: Soft, non-tender, non-distended EXTREMITIES:  No edema; No acute deformity   Asessement and Plan:[name]    Hypertension Elevated blood pressure readings at home and in clinic. Patient reports stress and financial difficulties as potential contributing factors. Currently on Metoprolol  25mg  daily and Entresto  49/51mg  twice daily, though he has not yet started the increased dose of Entresto  due to financial constraints. -provided a copay card today for Entresto  (10$ a month) -Change prescription to monthly refills to accommodate his financial situation. -Encourage him to discuss stressors with family and explore potential stress-relieving activities such as visiting a local gun range. -Provide him with information on a anti-hypertensive diet.  Renal Failure/HD dependent  He was declined for transplant status. Negative workup for amyloidosis. He is on home dialysis. -Ensure all results are sent to the transplant team for their records. -Continue current management plan.  Edema He reports mild swelling in the right leg, which started  after a catheter procedure. No swelling at the catheter site. -Advise him to continue with current management (elevation of legs). -Continue to monitor.  Mild CM -Recent echocardiogram reviewed -No medication changes made today -Would encourage continuing metoprolol  25 mg tdaily, Entresto  49-51mg  twice a day     Disposition: F/u with Dr. Paulita Boss in 3-4 months  Signed, Von Grumbling, PA-C

## 2023-05-10 ENCOUNTER — Encounter: Payer: Self-pay | Admitting: Physician Assistant

## 2023-05-10 ENCOUNTER — Ambulatory Visit: Payer: BC Managed Care – PPO | Attending: Physician Assistant | Admitting: Physician Assistant

## 2023-05-10 VITALS — BP 144/88 | HR 85 | Ht 69.0 in | Wt 197.8 lb

## 2023-05-10 DIAGNOSIS — R931 Abnormal findings on diagnostic imaging of heart and coronary circulation: Secondary | ICD-10-CM | POA: Diagnosis not present

## 2023-05-10 DIAGNOSIS — I10 Essential (primary) hypertension: Secondary | ICD-10-CM | POA: Diagnosis not present

## 2023-05-10 DIAGNOSIS — I420 Dilated cardiomyopathy: Secondary | ICD-10-CM | POA: Diagnosis not present

## 2023-05-10 DIAGNOSIS — N186 End stage renal disease: Secondary | ICD-10-CM

## 2023-05-10 DIAGNOSIS — I517 Cardiomegaly: Secondary | ICD-10-CM

## 2023-05-10 MED ORDER — ENTRESTO 49-51 MG PO TABS: 1.0000 | ORAL_TABLET | Freq: Two times a day (BID) | ORAL | 11 refills | Status: DC

## 2023-05-10 NOTE — Patient Instructions (Signed)
 Medication Instructions:  Increase Entresto  to 49-51 by mouth 2 times per day. New script sent. Patient given 10$ coupon card as well. *If you need a refill on your cardiac medications before your next appointment, please call your pharmacy*    Follow-Up: At [facility], you and your health needs are our priority.  As part of our continuing mission to provide you with exceptional heart care, we have created designated Provider Care Teams.  These Care Teams include your primary Cardiologist (physician) and Advanced Practice Providers (APPs -  Physician Assistants and Nurse Practitioners) who all work together to provide you with the care you need, when you need it.  We recommend signing up for the patient portal called "MyChart".  Sign up information is provided on this After Visit Summary.  MyChart is used to connect with patients for Virtual Visits (Telemedicine).  Patients are able to view lab/test results, encounter notes, upcoming appointments, etc.  Non-urgent messages can be sent to your provider as well.   To learn more about what you can do with MyChart, go to [email or web address].    Your next appointment:    First Available  Provider:   Jann Melody, MD     Other Instructions Cardiology test results will be faxed to Pre Transplant team at [facility] at fax#[phone]  Take and record blood pressure at home 2x daily for 1-2 weeks. Record on log provided by staff and return to the office for review

## 2023-05-16 ENCOUNTER — Observation Stay (HOSPITAL_COMMUNITY)
Admission: EM | Admit: 2023-05-16 | Discharge: 2023-05-18 | Disposition: A | Discharge status: Home / Self Care | Payer: BC Managed Care – PPO | Attending: Internal Medicine | Admitting: Internal Medicine

## 2023-05-16 ENCOUNTER — Encounter (HOSPITAL_COMMUNITY): Payer: Self-pay | Admitting: Emergency Medicine

## 2023-05-16 ENCOUNTER — Emergency Department (HOSPITAL_COMMUNITY): Payer: BC Managed Care – PPO

## 2023-05-16 ENCOUNTER — Other Ambulatory Visit: Payer: Self-pay

## 2023-05-16 DIAGNOSIS — I5020 Unspecified systolic (congestive) heart failure: Secondary | ICD-10-CM | POA: Diagnosis not present

## 2023-05-16 DIAGNOSIS — R000 Tachycardia, unspecified: Secondary | ICD-10-CM | POA: Diagnosis present

## 2023-05-16 DIAGNOSIS — I420 Dilated cardiomyopathy: Secondary | ICD-10-CM | POA: Diagnosis not present

## 2023-05-16 DIAGNOSIS — E1122 Type 2 diabetes mellitus with diabetic chronic kidney disease: Secondary | ICD-10-CM | POA: Insufficient documentation

## 2023-05-16 DIAGNOSIS — I10 Essential (primary) hypertension: Secondary | ICD-10-CM | POA: Diagnosis present

## 2023-05-16 DIAGNOSIS — N186 End stage renal disease: Secondary | ICD-10-CM | POA: Diagnosis not present

## 2023-05-16 DIAGNOSIS — E871 Hypo-osmolality and hyponatremia: Secondary | ICD-10-CM | POA: Insufficient documentation

## 2023-05-16 DIAGNOSIS — D61818 Other pancytopenia: Secondary | ICD-10-CM | POA: Diagnosis not present

## 2023-05-16 DIAGNOSIS — Z992 Dependence on renal dialysis: Secondary | ICD-10-CM | POA: Diagnosis not present

## 2023-05-16 DIAGNOSIS — Z79899 Other long term (current) drug therapy: Secondary | ICD-10-CM | POA: Insufficient documentation

## 2023-05-16 DIAGNOSIS — E66811 Obesity, class 1: Secondary | ICD-10-CM | POA: Diagnosis not present

## 2023-05-16 DIAGNOSIS — I132 Hypertensive heart and chronic kidney disease with heart failure and with stage 5 chronic kidney disease, or end stage renal disease: Secondary | ICD-10-CM | POA: Diagnosis not present

## 2023-05-16 DIAGNOSIS — I4891 Unspecified atrial fibrillation: Principal | ICD-10-CM | POA: Diagnosis present

## 2023-05-16 DIAGNOSIS — D696 Thrombocytopenia, unspecified: Secondary | ICD-10-CM | POA: Diagnosis not present

## 2023-05-16 DIAGNOSIS — E039 Hypothyroidism, unspecified: Secondary | ICD-10-CM | POA: Diagnosis present

## 2023-05-16 DIAGNOSIS — E119 Type 2 diabetes mellitus without complications: Secondary | ICD-10-CM

## 2023-05-16 DIAGNOSIS — R7989 Other specified abnormal findings of blood chemistry: Secondary | ICD-10-CM

## 2023-05-16 MED ORDER — DILTIAZEM HCL-DEXTROSE 125-5 MG/125ML-% IV SOLN (PREMIX)
5.0000 mg/h | INTRAVENOUS | Status: DC
  Administered 2023-05-16: 5 mg/h via INTRAVENOUS
  Filled 2023-05-16: qty 125

## 2023-05-16 NOTE — ED Provider Notes (Signed)
[place] EMERGENCY DEPARTMENT AT [facility] Provider Note   CSN: [id number] Arrival date & time: 05/16/23  2219     History  Chief Complaint  Patient presents with   Tachycardia    [name] is a 48 y.o. male.  The history is provided by the patient and medical records.   48 year old male with history of dilated cardiomyopathy, end-stage renal disease on hemodialysis, hypertension, diabetes, gastroparesis, presenting to the ED with A-fib RVR.  Patient was getting ready for dialysis and noticed that his heart rate was high on vitals check at home.  He states that he notices fistula seem to be "thumping abnormally" which is what prompted him to check his vitals.  Heart rate was in the 170s.  He states he had a single episode of A-fib in his 48s but this was short-lived and never required intervention.  He was given 20 mg Cardizem bolus and route which slowed but remains in A-fib.  Rate around 110's to 130s on arrival.  Has not had dialysis yet this week, missed yesterday and was doing make-up session today.  Last full dialysis session was on Saturday.  Home Medications Prior to Admission medications   Medication Sig Start Date End Date Taking? Authorizing Provider  calcitRIOL (ROCALTROL) 0.5 MCG capsule Take 0.5 mcg by mouth daily.    [provider]  carvedilol (COREG) 25 MG tablet Take 1 tablet (25 mg total) by mouth 2 (two) times daily with a meal. Patient not taking: Reported on 05/10/2023 01/12/23   Burnadette Pop, MD  Cyanocobalamin (VITAMIN B-12 PO) Take 1 tablet by mouth daily.    [provider]  doxazosin (CARDURA) 2 MG tablet Take 2 mg by mouth at bedtime. 08/04/22   [provider]  ibuprofen (ADVIL) 200 MG tablet Take 400 mg by mouth 2 (two) times daily as needed for headache or moderate pain.    [provider]  metoCLOPramide (REGLAN) 5 MG tablet Take 1 tablet (5 mg total) by mouth every 6 (six) hours as needed for  nausea or vomiting. 01/12/23   Burnadette Pop, MD  metoprolol succinate (TOPROL-XL) 25 MG 24 hr tablet Take 1 tablet by mouth daily. 03/14/23   [provider]  pantoprazole (PROTONIX) 40 MG tablet Take 1 tablet (40 mg total) by mouth 2 (two) times daily before a meal. 12/22/22   Imogene Burn, MD  sacubitril-valsartan (ENTRESTO) 49-51 MG Take 1 tablet by mouth 2 (two) times daily. 05/10/23   Sharlene Dory, PA-C      Allergies    Ms contin [morphine] and Sulfa antibiotics    Review of Systems   Review of Systems  Cardiovascular:        AFIB  All other systems reviewed and are negative.   Physical Exam Updated Vital Signs Pulse (!) 49   Resp 14   Ht 5\' 9"  (1.753 m)   Wt 94.3 kg   SpO2 100%   BMI 30.70 kg/m   Physical Exam Vitals and nursing note reviewed.  Constitutional:      Appearance: He is well-developed.  HENT:     Head: Normocephalic and atraumatic.  Eyes:     Conjunctiva/sclera: Conjunctivae normal.     Pupils: Pupils are equal, round, and reactive to light.  Cardiovascular:     Rate and Rhythm: Tachycardia present. Rhythm irregularly irregular.     Heart sounds: Normal heart sounds.     Comments: AFIB rate 110's- 130's Pulmonary:  Effort: Pulmonary effort is normal. No respiratory distress.     Breath sounds: Normal breath sounds. No rhonchi.  Abdominal:     General: Bowel sounds are normal.     Palpations: Abdomen is soft.     Tenderness: There is no abdominal tenderness. There is no rebound.  Musculoskeletal:        General: Normal range of motion.     Cervical back: Normal range of motion.  Skin:    General: Skin is warm and dry.  Neurological:     Mental Status: He is alert and oriented to person, place, and time.     ED Results / Procedures / Treatments   Labs (all labs ordered are listed, but only abnormal results are displayed) Labs Reviewed  CBC WITH DIFFERENTIAL/PLATELET - Abnormal; Notable for the following components:       Result Value   RBC 3.55 (*)    Hemoglobin 10.3 (*)    HCT 32.4 (*)    RDW 16.0 (*)    Platelets 144 (*)    All other components within normal limits  I-STAT CHEM 8, ED - Abnormal; Notable for the following components:   BUN 53 (*)    Creatinine, Ser 12.00 (*)    Glucose, Bld 145 (*)    Calcium, Ion 1.06 (*)    TCO2 21 (*)    Hemoglobin 11.6 (*)    HCT 34.0 (*)    All other components within normal limits  COMPREHENSIVE METABOLIC PANEL  MAGNESIUM  TSH  T4, FREE    EKG EKG Interpretation Date/Time:  Tuesday May 16 2023 22:27:32 EST Ventricular Rate:  116 PR Interval:    QRS Duration:  76 QT Interval:  330 QTC Calculation: 459 R Axis:   74  Text Interpretation: Atrial fibrillation Consider left ventricular hypertrophy Nonspecific T abnormalities, lateral leads Confirmed by Glyn Ade ([id number]) on 05/16/2023 10:33:51 PM  Radiology DG Chest Port 1 View Result Date: 05/16/2023 CLINICAL DATA:  Atrial fibrillation EXAM: PORTABLE CHEST 1 VIEW COMPARISON:  None Available. FINDINGS: Lungs are clear. No pneumothorax or pleural effusion. Cardiac size is mildly enlarged. Pulmonary vascularity is normal. No acute bone abnormality. IMPRESSION: 1. Mild cardiomegaly. Electronically Signed   By: Helyn Numbers M.D.   On: 05/16/2023 23:06    Procedures Procedures    CRITICAL CARE Performed by: Garlon Hatchet   Total critical care time: 45 minutes  Critical care time was exclusive of separately billable procedures and treating other patients.  Critical care was necessary to treat or prevent imminent or life-threatening deterioration.  Critical care was time spent personally by me on the following activities: development of treatment plan with patient and/or surrogate as well as nursing, discussions with consultants, evaluation of patient's response to treatment, examination of patient, obtaining history from patient or surrogate, ordering and performing treatments and  interventions, ordering and review of laboratory studies, ordering and review of radiographic studies, pulse oximetry and re-evaluation of patient's condition.   Medications Ordered in ED Medications  diltiazem (CARDIZEM) 125 mg in dextrose 5% 125 mL (1 mg/mL) infusion (0 mg/hr Intravenous Stopped 05/17/23 0315)  acetaminophen (TYLENOL) tablet 650 mg (has no administration in time range)    Or  acetaminophen (TYLENOL) suppository 650 mg (has no administration in time range)  metoprolol succinate (TOPROL-XL) 24 hr tablet 25 mg (has no administration in time range)  sacubitril-valsartan (ENTRESTO) 49-51 mg per tablet (has no administration in time range)  metoCLOPramide (REGLAN) tablet 5 mg (has no administration  in time range)  ferric citrate (AURYXIA) tablet 420 mg (has no administration in time range)  doxazosin (CARDURA) tablet 2 mg (has no administration in time range)  pantoprazole (PROTONIX) EC tablet 40 mg (has no administration in time range)  heparin injection 5,000 Units (has no administration in time range)    ED Course/ Medical Decision Making/ A&P                                 Medical Decision Making Amount and/or Complexity of Data Reviewed Labs: ordered. Radiology: ordered and independent interpretation performed. ECG/medicine tests: ordered and independent interpretation performed.  Risk Prescription drug management. Decision regarding hospitalization.   48 year old male presenting to the ED with A-fib RVR.  Had incidence of this once in his 73s but never had ongoing issues.  Missed dialysis yesterday, did not go to treatment today due to elevated heart rate at home.  Remains in A-fib with RVR on arrival, rate 110s to 130s.  He denies any chest pain or shortness of breath.  He was given Cardizem bolus and route which seems to help control rate, will continue Cardizem drip.  Labs, chest x-ray pending.  Labs as above--no leukocytosis.  Stable anemia compared with  prior.  Potassium remains normal fortunately.  Magnesium is slightly elevated.  TSH also elevated.  This may be contributing.  CXR with some mild cardiomegaly but no overt fluid overload.  He is overall rate controlled on Cardizem now but remains in A-fib.  He will require admission for ongoing care.  Will likely need dialysis in the morning.  Discussed with hospitalist, Dr. Julian Reil-- will admit for ongoing care.  Final Clinical Impression(s) / ED Diagnoses Final diagnoses:  Atrial fibrillation with RVR (HCC)  Elevated TSH  ESRD (end stage renal disease) on dialysis [facility])    Rx / DC Orders ED Discharge Orders     None         Garlon Hatchet, PA-C 05/17/23 1610    Glyn Ade, MD 05/20/23 [phone]

## 2023-05-16 NOTE — ED Triage Notes (Signed)
Patient arrives via [facility] for afib rvr. Patient was checking vitals at home before dialysis and noticed his HR was high. EMS found patient in afib rvr with rate in the 170's. Patient states he had a "run" of afib in his 20's but no history since then. 20mg  Cardizem given 2159.    HR 90-140  BP 118/74

## 2023-05-16 NOTE — ED Notes (Signed)
Patient difficult IV stick. RN attempted twice. PA-Sanders notified and an order IV team

## 2023-05-17 ENCOUNTER — Telehealth: Payer: Self-pay | Admitting: Cardiology

## 2023-05-17 ENCOUNTER — Observation Stay (HOSPITAL_BASED_OUTPATIENT_CLINIC_OR_DEPARTMENT_OTHER): Payer: BC Managed Care – PPO

## 2023-05-17 ENCOUNTER — Observation Stay (INDEPENDENT_AMBULATORY_CARE_PROVIDER_SITE_OTHER): Payer: BC Managed Care – PPO

## 2023-05-17 DIAGNOSIS — N186 End stage renal disease: Secondary | ICD-10-CM

## 2023-05-17 DIAGNOSIS — I10 Essential (primary) hypertension: Secondary | ICD-10-CM | POA: Diagnosis not present

## 2023-05-17 DIAGNOSIS — I4891 Unspecified atrial fibrillation: Secondary | ICD-10-CM | POA: Diagnosis not present

## 2023-05-17 DIAGNOSIS — I5043 Acute on chronic combined systolic (congestive) and diastolic (congestive) heart failure: Secondary | ICD-10-CM

## 2023-05-17 DIAGNOSIS — I420 Dilated cardiomyopathy: Secondary | ICD-10-CM | POA: Diagnosis not present

## 2023-05-17 DIAGNOSIS — I480 Paroxysmal atrial fibrillation: Secondary | ICD-10-CM

## 2023-05-17 DIAGNOSIS — E119 Type 2 diabetes mellitus without complications: Secondary | ICD-10-CM

## 2023-05-17 DIAGNOSIS — I428 Other cardiomyopathies: Secondary | ICD-10-CM

## 2023-05-17 DIAGNOSIS — E039 Hypothyroidism, unspecified: Secondary | ICD-10-CM

## 2023-05-17 LAB — BASIC METABOLIC PANEL
Anion gap: 14 (ref 5–15)
BUN: 60 mg/dL — ABNORMAL HIGH (ref 6–20)
CO2: 21 mmol/L — ABNORMAL LOW (ref 22–32)
Calcium: 8.5 mg/dL — ABNORMAL LOW (ref 8.9–10.3)
Chloride: 104 mmol/L (ref 98–111)
Creatinine, Ser: 11.42 mg/dL — ABNORMAL HIGH (ref 0.61–1.24)
GFR, Estimated: 5 mL/min — ABNORMAL LOW (ref >60)
Glucose, Bld: 158 mg/dL — ABNORMAL HIGH (ref 70–99)
Potassium: 4.7 mmol/L (ref 3.5–5.1)
Sodium: 139 mmol/L (ref 135–145)

## 2023-05-17 LAB — CBC WITH DIFFERENTIAL/PLATELET
Abs Immature Granulocytes: 0.01 10*3/uL (ref 0.00–0.07)
Basophils Absolute: 0 10*3/uL (ref 0.0–0.1)
Basophils Relative: 1 %
Eosinophils Absolute: 0.2 10*3/uL (ref 0.0–0.5)
Eosinophils Relative: 5 %
HCT: 32.4 % — ABNORMAL LOW (ref 39.0–52.0)
Hemoglobin: 10.3 g/dL — ABNORMAL LOW (ref 13.0–17.0)
Immature Granulocytes: 0 %
Lymphocytes Relative: 18 %
Lymphs Abs: 0.8 10*3/uL (ref 0.7–4.0)
MCH: 29 pg (ref 26.0–34.0)
MCHC: 31.8 g/dL (ref 30.0–36.0)
MCV: 91.3 fL (ref 80.0–100.0)
Monocytes Absolute: 0.7 10*3/uL (ref 0.1–1.0)
Monocytes Relative: 15 %
Neutro Abs: 2.6 10*3/uL (ref 1.7–7.7)
Neutrophils Relative %: 61 %
Platelets: 144 10*3/uL — ABNORMAL LOW (ref 150–400)
RBC: 3.55 MIL/uL — ABNORMAL LOW (ref 4.22–5.81)
RDW: 16 % — ABNORMAL HIGH (ref 11.5–15.5)
WBC: 4.2 10*3/uL (ref 4.0–10.5)
nRBC: 0 % (ref 0.0–0.2)

## 2023-05-17 LAB — ECHOCARDIOGRAM COMPLETE
AR max vel: 2.34 cm2
AV Area VTI: 2.57 cm2
AV Area mean vel: 2.51 cm2
AV Mean grad: 5 mm[Hg]
AV Peak grad: 8.9 mm[Hg]
Ao pk vel: 1.49 m/s
Area-P 1/2: 4 cm2
Height: 69 in
S' Lateral: 3.7 cm
Weight: 3326.3 [oz_av]

## 2023-05-17 LAB — COMPREHENSIVE METABOLIC PANEL
ALT: 28 U/L (ref 0–44)
AST: 20 U/L (ref 15–41)
Albumin: 3.7 g/dL (ref 3.5–5.0)
Alkaline Phosphatase: 52 U/L (ref 38–126)
Anion gap: 13 (ref 5–15)
BUN: 59 mg/dL — ABNORMAL HIGH (ref 6–20)
CO2: 21 mmol/L — ABNORMAL LOW (ref 22–32)
Calcium: 8.3 mg/dL — ABNORMAL LOW (ref 8.9–10.3)
Chloride: 103 mmol/L (ref 98–111)
Creatinine, Ser: 10.83 mg/dL — ABNORMAL HIGH (ref 0.61–1.24)
GFR, Estimated: 5 mL/min — ABNORMAL LOW (ref >60)
Glucose, Bld: 150 mg/dL — ABNORMAL HIGH (ref 70–99)
Potassium: 4.7 mmol/L (ref 3.5–5.1)
Sodium: 137 mmol/L (ref 135–145)
Total Bilirubin: 0.5 mg/dL (ref 0.0–1.2)
Total Protein: 6.6 g/dL (ref 6.5–8.1)

## 2023-05-17 LAB — I-STAT CHEM 8, ED
BUN: 53 mg/dL — ABNORMAL HIGH (ref 6–20)
Calcium, Ion: 1.06 mmol/L — ABNORMAL LOW (ref 1.15–1.40)
Chloride: 105 mmol/L (ref 98–111)
Creatinine, Ser: 12 mg/dL — ABNORMAL HIGH (ref 0.61–1.24)
Glucose, Bld: 145 mg/dL — ABNORMAL HIGH (ref 70–99)
HCT: 34 % — ABNORMAL LOW (ref 39.0–52.0)
Hemoglobin: 11.6 g/dL — ABNORMAL LOW (ref 13.0–17.0)
Potassium: 4.7 mmol/L (ref 3.5–5.1)
Sodium: 137 mmol/L (ref 135–145)
TCO2: 21 mmol/L — ABNORMAL LOW (ref 22–32)

## 2023-05-17 LAB — CBC
HCT: 31.3 % — ABNORMAL LOW (ref 39.0–52.0)
Hemoglobin: 9.9 g/dL — ABNORMAL LOW (ref 13.0–17.0)
MCH: 29 pg (ref 26.0–34.0)
MCHC: 31.6 g/dL (ref 30.0–36.0)
MCV: 91.8 fL (ref 80.0–100.0)
Platelets: 150 10*3/uL (ref 150–400)
RBC: 3.41 MIL/uL — ABNORMAL LOW (ref 4.22–5.81)
RDW: 16 % — ABNORMAL HIGH (ref 11.5–15.5)
WBC: 3.9 10*3/uL — ABNORMAL LOW (ref 4.0–10.5)
nRBC: 0 % (ref 0.0–0.2)

## 2023-05-17 LAB — TSH: TSH: 18.785 u[IU]/mL — ABNORMAL HIGH (ref 0.350–4.500)

## 2023-05-17 LAB — MAGNESIUM: Magnesium: 2.6 mg/dL — ABNORMAL HIGH (ref 1.7–2.4)

## 2023-05-17 LAB — T4, FREE: Free T4: 0.55 ng/dL — ABNORMAL LOW (ref 0.61–1.12)

## 2023-05-17 LAB — CBG MONITORING, ED: Glucose-Capillary: 93 mg/dL (ref 70–99)

## 2023-05-17 LAB — HEPATITIS B SURFACE ANTIGEN: Hepatitis B Surface Ag: NONREACTIVE

## 2023-05-17 MED ORDER — SACUBITRIL-VALSARTAN 49-51 MG PO TABS
1.0000 | ORAL_TABLET | Freq: Two times a day (BID) | ORAL | Status: DC
  Administered 2023-05-17 – 2023-05-18 (×3): 1 via ORAL
  Filled 2023-05-17 (×3): qty 1

## 2023-05-17 MED ORDER — METOPROLOL SUCCINATE ER 25 MG PO TB24
25.0000 mg | ORAL_TABLET | Freq: Every day | ORAL | Status: DC
  Administered 2023-05-17 – 2023-05-18 (×2): 25 mg via ORAL
  Filled 2023-05-17 (×2): qty 1

## 2023-05-17 MED ORDER — APIXABAN 5 MG PO TABS
5.0000 mg | ORAL_TABLET | Freq: Two times a day (BID) | ORAL | Status: DC
Start: 2023-05-17 — End: 2023-05-18
  Administered 2023-05-17 – 2023-05-18 (×2): 5 mg via ORAL
  Filled 2023-05-17 (×2): qty 1

## 2023-05-17 MED ORDER — HEPARIN SODIUM (PORCINE) 5000 UNIT/ML IJ SOLN: 5000.0000 [IU] | Freq: Three times a day (TID) | INTRAMUSCULAR | Status: DC

## 2023-05-17 MED ORDER — METOCLOPRAMIDE HCL 5 MG PO TABS: 5.0000 mg | ORAL_TABLET | Freq: Four times a day (QID) | ORAL | Status: DC | PRN

## 2023-05-17 MED ORDER — CHLORHEXIDINE GLUCONATE CLOTH 2 % EX PADS: 6.0000 | MEDICATED_PAD | Freq: Every day | CUTANEOUS | Status: DC

## 2023-05-17 MED ORDER — ACETAMINOPHEN 325 MG PO TABS: 650.0000 mg | ORAL_TABLET | Freq: Four times a day (QID) | ORAL | Status: DC | PRN

## 2023-05-17 MED ORDER — HEPARIN SODIUM (PORCINE) 1000 UNIT/ML DIALYSIS: 4000.0000 [IU] | Freq: Once | INTRAMUSCULAR | Status: AC

## 2023-05-17 MED ORDER — PANTOPRAZOLE SODIUM 40 MG PO TBEC
40.0000 mg | DELAYED_RELEASE_TABLET | Freq: Two times a day (BID) | ORAL | Status: DC
  Administered 2023-05-17 – 2023-05-18 (×3): 40 mg via ORAL
  Filled 2023-05-17 (×3): qty 1

## 2023-05-17 MED ORDER — ACETAMINOPHEN 650 MG RE SUPP: 650.0000 mg | Freq: Four times a day (QID) | RECTAL | Status: DC | PRN

## 2023-05-17 MED ORDER — ONDANSETRON HCL 4 MG/2ML IJ SOLN
4.0000 mg | Freq: Four times a day (QID) | INTRAMUSCULAR | Status: DC | PRN
Start: 2023-05-17 — End: 2023-05-17

## 2023-05-17 MED ORDER — DOXAZOSIN MESYLATE 2 MG PO TABS
2.0000 mg | ORAL_TABLET | Freq: Every day | ORAL | Status: DC
  Administered 2023-05-18: 2 mg via ORAL
  Filled 2023-05-17 (×3): qty 1

## 2023-05-17 MED ORDER — APIXABAN 5 MG PO TABS: 5.0000 mg | ORAL_TABLET | Freq: Two times a day (BID) | ORAL | Status: DC

## 2023-05-17 MED ORDER — ONDANSETRON HCL 4 MG PO TABS: 4.0000 mg | ORAL_TABLET | Freq: Four times a day (QID) | ORAL | Status: DC | PRN

## 2023-05-17 MED ORDER — CALCITRIOL 0.25 MCG PO CAPS
0.5000 ug | ORAL_CAPSULE | Freq: Every day | ORAL | Status: DC
  Administered 2023-05-17 – 2023-05-18 (×2): 0.5 ug via ORAL
  Filled 2023-05-17: qty 2
  Filled 2023-05-17: qty 1

## 2023-05-17 MED ORDER — CALCITRIOL 0.5 MCG PO CAPS: 0.5000 ug | ORAL_CAPSULE | Freq: Every day | ORAL | Status: DC

## 2023-05-17 MED ORDER — HEPARIN SODIUM (PORCINE) 5000 UNIT/ML IJ SOLN
5000.0000 [IU] | Freq: Three times a day (TID) | INTRAMUSCULAR | Status: DC
  Administered 2023-05-17 (×2): 5000 [IU] via SUBCUTANEOUS
  Filled 2023-05-17 (×2): qty 1

## 2023-05-17 MED ORDER — FERRIC CITRATE 1 GM 210 MG(FE) PO TABS
420.0000 mg | ORAL_TABLET | Freq: Every day | ORAL | Status: DC
  Administered 2023-05-17 – 2023-05-18 (×2): 420 mg via ORAL
  Filled 2023-05-17 (×3): qty 2

## 2023-05-17 MED ORDER — HEPARIN SODIUM (PORCINE) 1000 UNIT/ML IJ SOLN
INTRAMUSCULAR | Status: AC
  Administered 2023-05-17: 4000 [IU] via INTRAVENOUS_CENTRAL
  Filled 2023-05-17: qty 6

## 2023-05-17 MED ORDER — HEPARIN SODIUM (PORCINE) 1000 UNIT/ML DIALYSIS
2000.0000 [IU] | INTRAMUSCULAR | Status: AC | PRN
  Administered 2023-05-17: 2000 [IU] via INTRAVENOUS_CENTRAL

## 2023-05-17 MED ORDER — LEVOTHYROXINE SODIUM 25 MCG PO TABS
25.0000 ug | ORAL_TABLET | Freq: Every day | ORAL | Status: DC
  Administered 2023-05-17 – 2023-05-18 (×2): 25 ug via ORAL
  Filled 2023-05-17 (×2): qty 1

## 2023-05-17 NOTE — Progress Notes (Signed)
   05/17/23 1945  Vitals  Temp 98.5 F (36.9 C)  Temp Source Oral  BP (!) 165/86  MAP (mmHg) 109  BP Location Left Arm  BP Method Automatic  Patient Position (if appropriate) Lying  Pulse Rate 80  Pulse Rate Source Monitor  ECG Heart Rate 80  Resp (!) 9  Oxygen Therapy  SpO2 100 %  O2 Device Room Air  During Treatment Monitoring  Blood Flow Rate (mL/min) 0 mL/min  Arterial Pressure (mmHg) -1.82 mmHg  Venous Pressure (mmHg) 60.8 mmHg  TMP (mmHg) 25.45 mmHg  Ultrafiltration Rate (mL/min) 1519 mL/min  Dialysate Flow Rate (mL/min) 299 ml/min  Duration of HD Treatment -hour(s) 1.5 hour(s)  Cumulative Fluid Removed (mL) per Treatment  1800.3  HD Safety Checks Performed Yes  Intra-Hemodialysis Comments Tx completed  Post Treatment  Dialyzer Clearance Lightly streaked  Liters Processed 83  Fluid Removed (mL) 3000 mL  Tolerated HD Treatment Yes  Post-Hemodialysis Comments pPt stable  AVG/AVF Arterial Site Held (minutes) 10 minutes  AVG/AVF Venous Site Held (minutes) 10 minutes   Pt Stable ; 3000 ml net fluid removed.

## 2023-05-17 NOTE — ED Notes (Signed)
 Patient to hemodialysis

## 2023-05-17 NOTE — Assessment & Plan Note (Signed)
Pt with hypothyroidism on lab work today. Starting low dose synthroid Repeat labs in 4-6 weeks.

## 2023-05-17 NOTE — Progress Notes (Signed)
PROGRESS NOTE    [name]  UEA:[id number] DOB: 02-Feb-1976 DOA: 05/16/2023 PCP: Starla Link, MD   Brief Narrative:  The patient is a 48 year old obese African-American male with past medical history significant for biometry hypertension, ESRD on home dialysis, heart failure with reduced ejection fraction with EF of 30 to 40% and a history of A-fib once approximately 20 years ago presented the ED after he noticed his heart rate is high on a vital sign check at home.  He is taking bottles prior to his HD at home and denies shortness of breath or chest pain and states that if he had not taken vitals he would not have discovered that he is in A-fib.  Heart rate at home was in the 170s which prompted him to come to the ED.  And route he is given 20 mg of IV Cardizem and started on a Cardizem drip.  Of note he has not had dialysis this week and missed yesterday and is going to go a make-up session today.  In the ED his rate was controlled and subsequently converted back to normal sinus rhythm around 3 AM.  Nephrology and cardiology has been consulted for further evaluation and plans for dialysis tonight.  Cardiology evaluating and making some medication changes.  Assessment and Plan:  New Onset Atrial fibrillation with RVR (HCC) -Has New onset a.fib RVR -Was Initiated on Cardizem gtt for rate control and held paused at the moment since pt just converted out of a.fib and back into Sinus and will not continue given low EF -C/w Telemetry Monitoring  -C/w Metoprolol Succinate 25 mg po Daily -Stopping Amlodipine (thinking he's going to wind up on either cardizem or coreg instead) -CHA2DS2-VASc = 3, does have reduced EF.   -Heparin sq DVT ppx now changed to Apixaban 5 mg po BID for A fib -Ordering 2d echo to see if EF changed in past 2 months. -Cardiology consulted for further evaluation and recommendations and they are considering cardiac monitoring to assess A-fib burden in outpatient setting    ESRD (End Stage Renal Disease) (HCC) Metabolic Acidosis -He missed dialysis yesterday due to new onset A.fib RVR -[place] acidosis with a CO2 21, anion gap of 14, chloride level 104 -BUN/Cr Trend: Recent Labs  Lab 05/17/23 0007 05/17/23 0023 05/17/23 0340  BUN 59* 53* 60*  CREATININE 10.83* 12.00* 11.42*  -Will need to discontinue avoid taking ibuprofen and recommending calcitriol instead of Tums; Aldactone has been held -C/w Ferric Citrate 420 mg po Daily  -Avoid Nephrotoxic Medications, Contrast Dyes, Hypotension and Dehydration to Ensure Adequate Renal Perfusion and will need to Renally Adjust Meds -Continue to Monitor and Trend Renal Function carefully and repeat CMP in the AM    Hypothyroidism -Pt with hypothyroidism on lab work today as TSH was 18.785 and free T4 was 0.55 -Starting low dose Levothyroxine 25 Mcg P.O. Daily -Will need repeat labs in 4-6 weeks.   Nonischemic Dilated cardiomyopathy with reduced EF -HFrEF with EF 35-40% as of most recent echo. -Cont Metoprolol Succinate 25 mg po Daily and Sacubitril-Valsartan 49-51 1 tab po BID -Hold Spironolactone for now -Cardiology consulted for further evaluation and management and GDMT is limited due to his renal disease   Type 2 Diabetes Mellitus without complication, without long-term current use of insulin (HCC) -Previous A1C of 5.2, doesn't look like he's on any meds for this. -Continue to Monitor CBGs per protocol -CBG Trend: No results for input(s): "GLUCAP" in the last 720 hours.   Hypertension -  Continue Metoprolol Succinate 25 mg po Daily and Sacubitril-Valsartan 49-51 1 tab po BID -Currently holding amlodipine - either replace with cardizem or more likely a BB given HFrEF (will ask cards to weigh in on this since they follow him for BP). -Continue to Hold aldactone - Dialysis pt on a K sparing diuretic? Might want to double check with nephrology first. -Continue to Monitor BP per Protocol -Last BP reading was  146/85  Normocytic Anemia/Anemia of Chronic Kidney Disease -Hgb/Hct Trend: Recent Labs  Lab 05/17/23 0007 05/17/23 0023 05/17/23 0340  HGB 10.3* 11.6* 9.9*  HCT 32.4* 34.0* 31.3*  MCV 91.3  --  91.8  -Check Anemia Panel in the AM -Continue to Monitor for S/Sx of Bleeding; No overt bleeding noted -Repeat CBC in the AM   Thrombocytopenia -Platelet Count Trend: Recent Labs  Lab 05/17/23 0007 05/17/23 0340  PLT 144* 150  -Continue to Monitor for S/Sx of Bleeding; No overt bleeding noted -Repeat CBC in the AM   GERD/GI Prophylaxis -C/w PPI with Pantoprazole 40 mg po BID  Class I Obesity -Complicates overall prognosis and care -Estimated body mass index is 30.7 kg/m as calculated from the following:   Height as of this encounter: 5\' 9"  (1.753 m).   Weight as of this encounter: 94.3 kg.  -Weight Loss and Dietary Counseling given   DVT prophylaxis: heparin injection 5,000 Units Start: 05/17/23 0600    Code Status: Full Code Family Communication: No family present at bedside   Disposition Plan:  Level of care: Progressive Status is: Observation The patient remains OBS appropriate and will d/c before 2 midnights.   Consultants:  Nephrology Cardiology  Procedures:  HD  Antimicrobials:  Anti-infectives (From admission, onward)    None       Objective: Vitals:   05/17/23 1000 05/17/23 1100 05/17/23 1204 05/17/23 1215  BP: (!) 144/85 (!) 148/84  (!) 146/85  Pulse: 72 74  77  Resp: 12 13  12   Temp:   98.2 F (36.8 C)   TempSrc:      SpO2: 100% 100%  100%  Weight:      Height:       No intake or output data in the 24 hours ending 05/17/23 1441 Filed Weights   05/16/23 2230  Weight: 94.3 kg   Data Reviewed: I have personally reviewed following labs and imaging studies  CBC: Recent Labs  Lab 05/17/23 0007 05/17/23 0023 05/17/23 0340  WBC 4.2  --  3.9*  NEUTROABS 2.6  --   --   HGB 10.3* 11.6* 9.9*  HCT 32.4* 34.0* 31.3*  MCV 91.3  --  91.8   PLT 144*  --  150   Basic Metabolic Panel: Recent Labs  Lab 05/17/23 0007 05/17/23 0023 05/17/23 0340  NA 137 137 139  K 4.7 4.7 4.7  CL 103 105 104  CO2 21*  --  21*  GLUCOSE 150* 145* 158*  BUN 59* 53* 60*  CREATININE 10.83* 12.00* 11.42*  CALCIUM 8.3*  --  8.5*  MG 2.6*  --   --    GFR: Estimated Creatinine Clearance: 9.2 mL/min (A) (by C-G formula based on SCr of 11.42 mg/dL (H)). Liver Function Tests: Recent Labs  Lab 05/17/23 0007  AST 20  ALT 28  ALKPHOS 52  BILITOT 0.5  PROT 6.6  ALBUMIN 3.7   No results for input(s): "LIPASE", "AMYLASE" in the last 168 hours. No results for input(s): "AMMONIA" in the last 168 hours. Coagulation Profile: No  results for input(s): "INR", "PROTIME" in the last 168 hours. Cardiac Enzymes: No results for input(s): "CKTOTAL", "CKMB", "CKMBINDEX", "TROPONINI" in the last 168 hours. BNP (last 3 results) No results for input(s): "PROBNP" in the last 8760 hours. HbA1C: No results for input(s): "HGBA1C" in the last 72 hours. CBG: No results for input(s): "GLUCAP" in the last 168 hours. Lipid Profile: No results for input(s): "CHOL", "HDL", "LDLCALC", "TRIG", "CHOLHDL", "LDLDIRECT" in the last 72 hours. Thyroid Function Tests: Recent Labs    05/17/23 0007  TSH 18.785*  FREET4 0.55*   Anemia Panel: No results for input(s): "VITAMINB12", "FOLATE", "FERRITIN", "TIBC", "IRON", "RETICCTPCT" in the last 72 hours. Sepsis Labs: No results for input(s): "PROCALCITON", "LATICACIDVEN" in the last 168 hours.  No results found for this or any previous visit (from the past 240 hours).   Radiology Studies: DG Chest Port 1 View Result Date: 05/16/2023 CLINICAL DATA:  Atrial fibrillation EXAM: PORTABLE CHEST 1 VIEW COMPARISON:  None Available. FINDINGS: Lungs are clear. No pneumothorax or pleural effusion. Cardiac size is mildly enlarged. Pulmonary vascularity is normal. No acute bone abnormality. IMPRESSION: 1. Mild cardiomegaly.  Electronically Signed   By: Helyn Numbers M.D.   On: 05/16/2023 23:06   Scheduled Meds:  calcitRIOL  0.5 mcg Oral Daily   [START ON 05/18/2023] Chlorhexidine Gluconate Cloth  6 each Topical Q0600   doxazosin  2 mg Oral QHS   ferric citrate  420 mg Oral QAC breakfast   heparin injection (subcutaneous)  5,000 Units Subcutaneous Q8H   levothyroxine  25 mcg Oral Q0600   metoprolol succinate  25 mg Oral Daily   pantoprazole  40 mg Oral BID AC   sacubitril-valsartan  1 tablet Oral BID   Continuous Infusions:  diltiazem (CARDIZEM) infusion Stopped (05/17/23 0315)    LOS: 0 days   Marguerita Merles, DO [facility] Available via Epic secure chat 7am-7pm After these hours, please refer to coverage provider listed on [email or web address] 05/17/2023, 2:41 PM

## 2023-05-17 NOTE — Consult Note (Signed)
Cardiology Consultation   Patient ID: [name] MRN: [id number]; DOB: 05/30/1975  Admit date: 05/16/2023 Date of Consult: 05/17/2023  PCP:  Starla Link, MD   [facility] Providers Cardiologist:  Christell Constant, MD   {  Patient Profile:   [name] is a 48 y.o. male with a hx of nonischemic cardiomyopathy with reduced EF, ESRD on home dialysis since 2021, hypertension, diabetes, who is being seen 05/17/2023 for the evaluation of new onset atrial fibrillation at the request of ED.  History of Present Illness:   [name] originally was followed by [facility] and was getting workup for possible kidney transplant.  In July 2024 he had a cardiac catheterization that did not demonstrate significant CAD, showed moderate post capillary pulmonary hypertension.  He was hospitalized in September 2024 with pneumonia and had mildly reduced EF 45 to 50% thought possibly related to stress-induced versus hypertension.  He had another echocardiogram in November 2024 that showed concerns of possible amyloidosis with drop in EF 35 to 40% with grade 3 diastolic dysfunction.  He has had PYP scan that was negative.  SPEP and UPEP have been ordered but uncertain of the results of these.  He has been on Toprol-XL and Entresto with the guidance of nephrology.  Did not tolerate carvedilol due to fatigue.  In regards to his transplant, it is not clear whether he has been completely denied.  This has been deferred though due to uncontrolled blood pressure, he reports  Currently patient being evaluated for new onset atrial fibrillation.  He reports that at home during his dialysis session yesterday at around 9:00 PM he notices heart rate was elevated in the 110s to 120s.  Eventually called EMS who confirmed atrial fibrillation on their strips.  He was admitted to the hospital and started on IV Cardizem but at approximately 3 AM had converted back to NSR and has been maintaining.  Of note, his  TSH was almost 19, T4  .55.  Started on Synthroid by primary team.  Patient is completely asymptomatic from his episode of atrial fibrillation and denies any palpitations, shortness of breath, dizziness, falls, syncope, chest pain.  He does generally well with his heart failure and does not note any significant symptoms from this.  He reports being functional and able to ambulate without difficulty.  Reports being compliant with his dialysis.  Additionally he reports possible episode of atrial fibrillation that was recognized during his eye surgery almost 20+ years ago.  No significant complaints.  He denies any smoking, drugs, alcohol.  Used to smoke for many years very heavily due to the passing of his son but reports only drinking socially now.  Chest x-ray negative.  Creatinine 11.2.  Magnesium 2.6.  Hemoglobin 9.9.    Past Medical History:  Diagnosis Date   ESRD (end stage renal disease) (HCC)    Hypertension     Past Surgical History:  Procedure Laterality Date   BIOPSY  12/22/2022   Procedure: BIOPSY;  Surgeon: Imogene Burn, MD;  Location: [facility];  Service: Gastroenterology;;   COLONOSCOPY WITH PROPOFOL N/A 12/22/2022   Procedure: COLONOSCOPY WITH PROPOFOL;  Surgeon: Imogene Burn, MD;  Location: [facility];  Service: Gastroenterology;  Laterality: N/A;   ESOPHAGOGASTRODUODENOSCOPY (EGD) WITH PROPOFOL N/A 12/22/2022   Procedure: ESOPHAGOGASTRODUODENOSCOPY (EGD) WITH PROPOFOL;  Surgeon: Imogene Burn, MD;  Location: [facility];  Service: Gastroenterology;  Laterality: N/A;   EYE SURGERY     POLYPECTOMY  12/22/2022  Procedure: POLYPECTOMY;  Surgeon: Imogene Burn, MD;  Location: [facility];  Service: Gastroenterology;;    Inpatient Medications: Scheduled Meds:  calcitRIOL  0.5 mcg Oral Daily   calcitRIOL  0.5 mcg Oral Daily   [START ON 05/18/2023] Chlorhexidine Gluconate Cloth  6 each Topical Q0600   doxazosin  2 mg Oral QHS   ferric citrate  420 mg Oral QAC breakfast    heparin injection (subcutaneous)  5,000 Units Subcutaneous Q8H   levothyroxine  25 mcg Oral Q0600   metoprolol succinate  25 mg Oral Daily   pantoprazole  40 mg Oral BID AC   sacubitril-valsartan  1 tablet Oral BID   Continuous Infusions:  diltiazem (CARDIZEM) infusion Stopped (05/17/23 0315)   PRN Meds: acetaminophen **OR** acetaminophen, metoCLOPramide  Allergies:    Allergies  Allergen Reactions   Ms Contin [Morphine] Anaphylaxis and Swelling    Full body swelling   Sulfa Antibiotics Swelling    Facial and eye swelling    Social History:   Social History   Socioeconomic History   Marital status: Married    Spouse name: Not on file   Number of children: 4   Years of education: Not on file   Highest education level: Not on file  Occupational History   Occupation: disable  Tobacco Use   Smoking status: Never   Smokeless tobacco: Never  Vaping Use   Vaping status: Not on file  Substance and Sexual Activity   Alcohol use: Not on file   Drug use: Not on file   Sexual activity: Not on file  Other Topics Concern   Not on file  Social History Narrative   Not on file   Social Drivers of Health   Financial Resource Strain: Low Risk  (10/30/2019)   Received from [facility], [facility]   Overall Financial Resource Strain (CARDIA)    Difficulty of Paying Living Expenses: Not hard at all  Food Insecurity: No Food Insecurity (01/06/2023)   Hunger Vital Sign    Worried About Running Out of Food in the Last Year: Never true    Ran Out of Food in the Last Year: Never true  Transportation Needs: No Transportation Needs (01/06/2023)   PRAPARE - Administrator, Civil Service (Medical): No    Lack of Transportation (Non-Medical): No  Physical Activity: Insufficiently Active (10/30/2019)   Received from [facility], [facility]   Exercise Vital Sign    Days of Exercise per Week: 3 days     Minutes of Exercise per Session: 10 min  Stress: No Stress Concern Present (11/16/2018)   Received from [facility], [facility]   [employer] of Occupational Health - Occupational Stress Questionnaire    Feeling of Stress : Only a little  Social Connections: Moderately Isolated (11/16/2018)   Received from [facility], [facility]   Social Connection and Isolation Panel [NHANES]    Frequency of Communication with Friends and Family: More than three times a week    Frequency of Social Gatherings with Friends and Family: Never    Attends Religious Services: Never    Database administrator or Organizations: No    Attends Banker Meetings: Never    Marital Status: Married  Catering manager Violence: Not At Risk (01/06/2023)   Humiliation, Afraid, Rape, and Kick questionnaire    Fear of Current or Ex-Partner: No  Emotionally Abused: No    Physically Abused: No    Sexually Abused: No    Family History:  Family History  Problem Relation Age of Onset   Diabetes Mother    Lung disease Father    Liver disease Neg Hx    Esophageal cancer Neg Hx    Colon cancer Neg Hx      ROS:  Please see the history of present illness. All other ROS reviewed and negative.     Physical Exam/Data:   Vitals:   05/17/23 0645 05/17/23 1000 05/17/23 1100 05/17/23 1204  BP: 123/78 (!) 144/85 (!) 148/84   Pulse: 65 72 74   Resp: 14 12 13    Temp:    98.2 F (36.8 C)  TempSrc:      SpO2: 100% 100% 100%   Weight:      Height:       No intake or output data in the 24 hours ending 05/17/23 1306    05/16/2023   10:30 PM 05/10/2023   10:47 AM 01/25/2023    8:33 AM  Last 3 Weights  Weight (lbs) 207 lb 14.3 oz 197 lb 12.8 oz 207 lb  Weight (kg) 94.3 kg 89.721 kg 93.895 kg     Body mass index is 30.7 kg/m.  General:  Well nourished, well developed, in no acute distress HEENT: normal Neck: no JVD Vascular: No  carotid bruits; Distal pulses 2+ bilaterally Cardiac:  normal S1, S2; RRR; no murmur  Lungs:  clear to auscultation bilaterally, no wheezing, rhonchi or rales  Abd: soft, nontender, no hepatomegaly  Ext: 1+ edema Musculoskeletal:  No deformities, BUE and BLE strength normal and equal Skin: warm and dry  Neuro:  CNs 2-12 intact, no focal abnormalities noted Psych:  Normal affect   EKG:  The EKG was personally reviewed and demonstrates: Sinus rhythm in the 60s.  No acute ST-T wave changes. Telemetry:  Telemetry was personally reviewed and demonstrates: He was transferred to new room so we only have data for the last couple hours.  NSR the whole time though.  Relevant CV Studies: PYP scan 04/17/2023  Myocardial uptake was negative for radiotracer uptake. The visual grade of myocardial uptake relative to the ribs was Grade 0 (No myocardial uptake and normal bone uptake).   Findings are not suggestive (Grade 0) of cardiac ATTR amyloidosis.   Prior study not available for comparison.    Echocardiogram 02/24/2023  1. Strain pattern with possible "cherry on top" pattern; suggest further  W/U to exclude amyloid.   2. Left ventricular ejection fraction, by estimation, is 35 to 40%. The  left ventricle has moderately decreased function. The left ventricle  demonstrates global hypokinesis. There is moderate concentric left  ventricular hypertrophy. Left ventricular  diastolic parameters are consistent with Grade III diastolic dysfunction  (restrictive). Elevated left atrial pressure. The average left ventricular  global longitudinal strain is -14.4 %. The global longitudinal strain is  abnormal.   3. Right ventricular systolic function is mildly reduced. The right  ventricular size is normal.   4. Left atrial size was severely dilated.   5. A small pericardial effusion is present.   6. The mitral valve is normal in structure. Trivial mitral valve  regurgitation. No evidence of mitral stenosis.    7. The aortic valve is tricuspid. Aortic valve regurgitation is not  visualized. No aortic stenosis is present.   8. The inferior vena cava is normal in size with greater than 50%  respiratory variability,  suggesting right atrial pressure of 3 mmHg.    Laboratory Data:  High Sensitivity Troponin:  No results for input(s): "TROPONINIHS" in the last 720 hours.   Chemistry Recent Labs  Lab 05/17/23 0007 05/17/23 0023 05/17/23 0340  NA 137 137 139  K 4.7 4.7 4.7  CL 103 105 104  CO2 21*  --  21*  GLUCOSE 150* 145* 158*  BUN 59* 53* 60*  CREATININE 10.83* 12.00* 11.42*  CALCIUM 8.3*  --  8.5*  MG 2.6*  --   --   GFRNONAA 5*  --  5*  ANIONGAP 13  --  14    Recent Labs  Lab 05/17/23 0007  PROT 6.6  ALBUMIN 3.7  AST 20  ALT 28  ALKPHOS 52  BILITOT 0.5   Lipids No results for input(s): "CHOL", "TRIG", "HDL", "LABVLDL", "LDLCALC", "CHOLHDL" in the last 168 hours.  Hematology Recent Labs  Lab 05/17/23 0007 05/17/23 0023 05/17/23 0340  WBC 4.2  --  3.9*  RBC 3.55*  --  3.41*  HGB 10.3* 11.6* 9.9*  HCT 32.4* 34.0* 31.3*  MCV 91.3  --  91.8  MCH 29.0  --  29.0  MCHC 31.8  --  31.6  RDW 16.0*  --  16.0*  PLT 144*  --  150   Thyroid  Recent Labs  Lab 05/17/23 0007  TSH 18.785*  FREET4 0.55*    BNPNo results for input(s): "BNP", "PROBNP" in the last 168 hours.  DDimer No results for input(s): "DDIMER" in the last 168 hours.   Radiology/Studies:  DG Chest Port 1 View Result Date: 05/16/2023 CLINICAL DATA:  Atrial fibrillation EXAM: PORTABLE CHEST 1 VIEW COMPARISON:  None Available. FINDINGS: Lungs are clear. No pneumothorax or pleural effusion. Cardiac size is mildly enlarged. Pulmonary vascularity is normal. No acute bone abnormality. IMPRESSION: 1. Mild cardiomegaly. Electronically Signed   By: Helyn Numbers M.D.   On: 05/16/2023 23:06     Assessment and Plan:   Nonischemic cardiomyopathy with reduced EF He has had previous cardiac catheterization July  2024 that did not demonstrate significant obstructive CAD.  He had recent drop in EF 35 to 40%, moderate concentric LVH, with grade 3 diastolic dysfunction with mildly reduced RV function.  Initially concerning for possible amyloid however has had negative PYP scan.  Etiology may be attributed to hypertension.  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  Slightly volume up, no shortness of breath. GDMT limited by renal disease.  He is on home dialysis for volume management Current regimen: Entresto 49-51 mg twice daily, Toprol-XL 25 mg. Has missed two sessions of dialysis now due to admission, so will see how his BP lands then titratre GDMT more after that.  Hold amlodipine for now. Did not tolerate carvedilol.  Cleda Daub being held, would defer to nephrology if they want him on this. Echo pending   New onset atrial fibrillation Asymptomatic, and in the setting of a TSH of almost 19, T4.55.  This was noted on the EMS run strips that I personally reviewed.  No EKG or telemetry has shown this due to changing rooms.  Reportedly he converted off of IV diltiazem, which is not ideal and should be avoided with patients with reduced EF.  He has a severely dilated LA which may represent A-fib chronicity or a result of his restrictive disease.  His CHA2DS2-VASc is 3 indicating need for anticoagulation.  Would start Eliquis 5 mg twice daily. Could consider cardiac monitor to assess A-fib  burden if episode felt exacerbated by thyroid dysfunction and if not wanting to be on DOAC, but he seems amenable.  HASBLED score is 2 indicating moderate bleeding risk.  Additionally, he uses heparin for his home HD so we will also need to take this into consideration.  Will discuss with MD.    ESRD on home dialysis Initially was getting workup for renal transplant however I think this is at a standstill currently.    Risk Assessment/Risk Scores:   New York Heart Association (NYHA) Functional Class NYHA Class  II  CHA2DS2-VASc Score = 3  This indicates a 3.2% annual risk of stroke. The patient's score is based upon: CHF History: 1 HTN History: 1 Diabetes History: 1 Stroke History: 0 Vascular Disease History: 0 Age Score: 0 Gender Score: 0     For questions or updates, please contact [facility] Please consult [email or web address] for contact info under    Signed, Abagail Kitchens, PA-C  05/17/2023 1:06 PM

## 2023-05-17 NOTE — Progress Notes (Unsigned)
Enrolled for [employer] to mail a ZIO XT long term holter monitor to the patients address on file.   Dr. Chandrasekhar to read. 

## 2023-05-17 NOTE — Assessment & Plan Note (Addendum)
Cont metoprolol Cont entresto Hold amlodipine - either replace with cardizem or more likely a BB given HFrEF (will ask cards to weigh in on this since they follow him for BP). Hold aldactone - Dialysis pt on a K sparing diuretic? Might want to double check with nephrology first.

## 2023-05-17 NOTE — Hospital Course (Addendum)
The patient is a 48 year old obese African-American male with past medical history significant for biometry hypertension, ESRD on home dialysis, heart failure with reduced ejection fraction with EF of 30 to 40% and a history of A-fib once approximately 20 years ago presented the ED after he noticed his heart rate is high on a vital sign check at home.  He is taking bottles prior to his HD at home and denies shortness of breath or chest pain and states that if he had not taken vitals he would not have discovered that he is in A-fib.  Heart rate at home was in the 170s which prompted him to come to the ED.  And route he is given 20 mg of IV Cardizem and started on a Cardizem drip.  Of note he has not had dialysis this week and missed yesterday and is going to go a make-up session today.  In the ED his rate was controlled and subsequently converted back to normal sinus rhythm around 3 AM.  Nephrology and cardiology has been consulted for further evaluation and plans for dialysis tonight.  Cardiology evaluating and making some medication changes and he is stable for discharge and improved and cleared by the nephrology team and the cardiology team.  Assessment and Plan:  New Onset Atrial fibrillation with RVR (HCC) -Has New onset a.fib RVR -Was Initiated on Cardizem gtt for rate control and held paused at the moment since pt just converted out of a.fib and back into Sinus and will not continue given low EF -C/w Telemetry Monitoring  -C/w Metoprolol Succinate 25 mg po Daily -Stopping Amlodipine (thinking he's going to wind up on either cardizem or coreg instead) -CHA2DS2-VASc = 3, does have reduced EF.   -Heparin sq DVT ppx now changed to Apixaban 5 mg po BID for A fib -Ordering 2d echo to see if EF changed in past 2 months and repeat showed an EF of 45 to 50%.  And also showed "The left ventricle  demonstrates global hypokinesis. There is mild left ventricular hypertrophy. Left ventricular diastolic  parameters are consistent with Grade III diastolic dysfunction (restrictive). Elevated left atrial pressure." -Cardiology consulted for further evaluation and recommendations and they are considering cardiac monitoring to assess A-fib burden in outpatient setting -Patient is getting a Zio patch sent to his house and outpatient follow-up with cardiology   ESRD (End Stage Renal Disease) (HCC) Metabolic Acidosis -He missed dialysis yesterday due to new onset A.fib RVR -[place] acidosis with a CO2 21, anion gap of 14, chloride level 104 -BUN/Cr Trend: Recent Labs  Lab 05/17/23 0007 05/17/23 0023 05/17/23 0340 05/18/23 0818  BUN 59* 53* 60* 38*  CREATININE 10.83* 12.00* 11.42* 8.97*  -Will need to discontinue avoid taking ibuprofen and recommending calcitriol instead of Tums; Aldactone has been held -C/w Ferric Citrate 420 mg po Daily  -Avoid Nephrotoxic Medications, Contrast Dyes, Hypotension and Dehydration to Ensure Adequate Renal Perfusion and will need to Renally Adjust Meds -Continue to Monitor and Trend Renal Function carefully and repeat CMP in the AM    Hypothyroidism -Pt with hypothyroidism on lab work today as TSH was 18.785 and free T4 was 0.55 -Starting low dose Levothyroxine 25 Mcg P.O. Daily -Will need repeat labs in 4-6 weeks.   Nonischemic Dilated cardiomyopathy with reduced EF -HFrEF with EF 35-40% as of most recent echo. -Cont Metoprolol Succinate 25 mg po Daily and Sacubitril-Valsartan 49-51 1 tab po BID -Hold Spironolactone for now -Cardiology consulted for further evaluation and management and GDMT  is limited due to his renal disease -Hold with cardiology in outpatient setting  Hyponatremia -Na+ Trend: Recent Labs  Lab 05/17/23 0007 05/17/23 0023 05/17/23 0340 05/18/23 0818  NA 137 137 139 133*  -Continue to Monitora nd Trend and repeat CMP in the outpateint setting   Type 2 Diabetes Mellitus without complication, without long-term current use of insulin  (HCC) -Previous A1C of 5.2, doesn't look like he's on any meds for this. -Continue to Monitor CBGs per protocol -CBG Trend:  Recent Labs  Lab 05/17/23 2313 05/18/23 1231 05/18/23 1624  GLUCAP 93 129* 107*   Hypertension -Continue Metoprolol Succinate 25 mg po Daily and Sacubitril-Valsartan 49-51 1 tab po BID -Currently holding amlodipine - either replace with cardizem or more likely a BB given HFrEF (will ask cards to weigh in on this since they follow him for BP). -Continue to Hold aldactone - Dialysis pt on a K sparing diuretic? Might want to double check with nephrology first. -Continue to Monitor BP per Protocol -Last BP reading was 146/85 -Follow-up with cardiology in outpatient setting  Pancytopenia -CBC Trend: Recent Labs  Lab 05/17/23 0007 05/17/23 0023 05/17/23 0340 05/18/23 0727  WBC 4.2  --  3.9* 2.7*  HGB 10.3*   < > 9.9* 11.7*  HCT 32.4*   < > 31.3* 35.9*  MCV 91.3  --  91.8 90.0  PLT 144*  --  150 122*   < > = values in this interval not displayed.  -Follow closely in the outpatient setting  Normocytic Anemia/Anemia of Chronic Kidney Disease -Hgb/Hct Trend: Recent Labs  Lab 05/17/23 0007 05/17/23 0023 05/17/23 0340 05/18/23 0727  HGB 10.3* 11.6* 9.9* 11.7*  HCT 32.4* 34.0* 31.3* 35.9*  MCV 91.3  --  91.8 90.0  -Check Anemia Panel within 1 week -Continue to Monitor for S/Sx of Bleeding; No overt bleeding noted -Repeat CBC within 1 week  Thrombocytopenia -Platelet Count Trend: Recent Labs  Lab 05/17/23 0007 05/17/23 0340 05/18/23 0727  PLT 144* 150 122*  -Continue to Monitor for S/Sx of Bleeding; No overt bleeding noted -Repeat CBC in 1 week  GERD/GI Prophylaxis -C/w PPI with Pantoprazole 40 mg po BID  Class I Obesity -Complicates overall prognosis and care -Estimated body mass index is 29.79 kg/m as calculated from the following:   Height as of this encounter: 5\' 9"  (1.753 m).   Weight as of this encounter: 91.5 kg.  -Weight Loss and  Dietary Counseling given

## 2023-05-17 NOTE — Procedures (Signed)
I was present at the procedure, reviewed the HD regimen and made appropriate changes.   Vinson Moselle MD  [facility] 05/17/2023, 4:03 PM

## 2023-05-17 NOTE — Assessment & Plan Note (Addendum)
HFrEF with EF 35-40% as of most recent echo. Cont metoprolol Cont entresto Hold aldactone for the moment

## 2023-05-17 NOTE — H&P (Addendum)
History and Physical    Patient: [name]:[id number] DOB: March 13, 1976 DOA: 05/16/2023 DOS: the patient was seen and examined on 05/17/2023 PCP: Starla Link, MD  Patient coming from: Home  Chief Complaint:  Chief Complaint  Patient presents with   Tachycardia   HPI: KENYEN CERON is a 48 y.o. male with medical history significant of HTN, ESRD, HFrEF with EF 35-40% on most recent echo in Nov 2024.  Pt with h/o a.fib only once before ~20 years ago he estimates.  Pt in to ED today after noticing HR was high on vital sign check at home.  He was taking his vitals prior to starting HD at home.  No CP, no SOB, and if he hadnt taken vitals he wouldn't have even discovered he was in a.fib (asymptomatic).  HR at home was 170s.  This prompted him to come in to ED.  Given 20mg  cardizem bolus en-route and started on cardizem gtt.  Hasn't had dialysis yet this week, missed yesterday, was going to make up session today.  ED course: Rate controlled with cardizem gtt.  At time of my exam while I'm in the room pt, initially in a.fib rate controlled, spontaneously converts out of a.fib back to NSR at a rate of 61.   Review of Systems: As mentioned in the history of present illness. All other systems reviewed and are negative. Past Medical History:  Diagnosis Date   ESRD (end stage renal disease) (HCC)    Hypertension    Past Surgical History:  Procedure Laterality Date   BIOPSY  12/22/2022   Procedure: BIOPSY;  Surgeon: Imogene Burn, MD;  Location: [facility];  Service: Gastroenterology;;   COLONOSCOPY WITH PROPOFOL N/A 12/22/2022   Procedure: COLONOSCOPY WITH PROPOFOL;  Surgeon: Imogene Burn, MD;  Location: [facility];  Service: Gastroenterology;  Laterality: N/A;   ESOPHAGOGASTRODUODENOSCOPY (EGD) WITH PROPOFOL N/A 12/22/2022   Procedure: ESOPHAGOGASTRODUODENOSCOPY (EGD) WITH PROPOFOL;  Surgeon: Imogene Burn, MD;  Location: [facility];  Service: Gastroenterology;   Laterality: N/A;   EYE SURGERY     POLYPECTOMY  12/22/2022   Procedure: POLYPECTOMY;  Surgeon: Imogene Burn, MD;  Location: [facility];  Service: Gastroenterology;;   Social History:  reports that he has never smoked. He has never used smokeless tobacco. No history on file for alcohol use and drug use.  Allergies  Allergen Reactions   Ms Contin [Morphine] Anaphylaxis and Swelling    Full body swelling   Sulfa Antibiotics Swelling    Facial and eye swelling    Family History  Problem Relation Age of Onset   Diabetes Mother    Lung disease Father    Liver disease Neg Hx    Esophageal cancer Neg Hx    Colon cancer Neg Hx     Prior to Admission medications   Medication Sig Start Date End Date Taking? Authorizing Provider  amLODipine (NORVASC) 10 MG tablet Take 10 mg by mouth daily. 05/12/23  Yes [provider]  calcium carbonate (TUMS - DOSED IN MG ELEMENTAL CALCIUM) 500 MG chewable tablet Chew 1,500 tablets by mouth daily.   Yes [provider]  Cyanocobalamin (VITAMIN B-12 PO) Take 1 tablet by mouth daily.   Yes [provider]  doxazosin (CARDURA) 2 MG tablet Take 2 mg by mouth at bedtime. 08/04/22  Yes [provider]  ferric citrate (AURYXIA) 1 GM 210 MG(Fe) tablet Take 420 mg by mouth daily before breakfast.   Yes [provider]  ibuprofen (ADVIL) 200 MG tablet Take 400 mg by mouth 2 (two) times daily as needed for headache or moderate pain.   Yes [provider]  metoCLOPramide (REGLAN) 5 MG tablet Take 1 tablet (5 mg total) by mouth every 6 (six) hours as needed for nausea or vomiting. 01/12/23  Yes Burnadette Pop, MD  metoprolol succinate (TOPROL-XL) 25 MG 24 hr tablet Take 1 tablet by mouth daily. 03/14/23  Yes [provider]  pantoprazole (PROTONIX) 40 MG tablet Take 1 tablet (40 mg total) by mouth 2 (two) times daily before a meal. 12/22/22  Yes Imogene Burn, MD  sacubitril-valsartan (ENTRESTO) 49-51 MG  Take 1 tablet by mouth 2 (two) times daily. 05/10/23  Yes Asa Lente, Tessa N, PA-C  spironolactone (ALDACTONE) 25 MG tablet Take 25 mg by mouth daily. 05/10/23  Yes [provider]  calcitRIOL (ROCALTROL) 0.5 MCG capsule Take 0.5 mcg by mouth daily. Patient not taking: Reported on 05/17/2023    [provider]    Physical Exam: Vitals:   05/17/23 0345 05/17/23 0400 05/17/23 0410 05/17/23 0412  BP: 126/76 123/75    Pulse: 60 60 60 (!) 59  Resp: 13 11 16 14   Temp:      TempSrc:      SpO2: 100% 100% 100% 100%  Weight:      Height:       Constitutional: NAD, calm, comfortable Respiratory: clear to auscultation bilaterally, no wheezing, no crackles. Normal respiratory effort. No accessory muscle use.  Cardiovascular: Regular rate and rhythm, no murmurs / rubs / gallops. No extremity edema. 2+ pedal pulses. No carotid bruits.  Abdomen: no tenderness, no masses palpated. No hepatosplenomegaly. Bowel sounds positive.  Neurologic: CN 2-12 grossly intact. Sensation intact, DTR normal. Strength 5/5 in all 4.  Psychiatric: Normal judgment and insight. Alert and oriented x 3. Normal mood.   Data Reviewed:    Labs on Admission: I have personally reviewed following labs and imaging studies  CBC: Recent Labs  Lab 05/17/23 0007 05/17/23 0023 05/17/23 0340  WBC 4.2  --  3.9*  NEUTROABS 2.6  --   --   HGB 10.3* 11.6* 9.9*  HCT 32.4* 34.0* 31.3*  MCV 91.3  --  91.8  PLT 144*  --  150   Basic Metabolic Panel: Recent Labs  Lab 05/17/23 0007 05/17/23 0023 05/17/23 0340  NA 137 137 139  K 4.7 4.7 4.7  CL 103 105 104  CO2 21*  --  21*  GLUCOSE 150* 145* 158*  BUN 59* 53* 60*  CREATININE 10.83* 12.00* 11.42*  CALCIUM 8.3*  --  8.5*  MG 2.6*  --   --    GFR: Estimated Creatinine Clearance: 9.2 mL/min (A) (by C-G formula based on SCr of 11.42 mg/dL (H)). Liver Function Tests: Recent Labs  Lab 05/17/23 0007  AST 20  ALT 28  ALKPHOS 52  BILITOT 0.5  PROT 6.6   ALBUMIN 3.7   No results for input(s): "LIPASE", "AMYLASE" in the last 168 hours. No results for input(s): "AMMONIA" in the last 168 hours. Coagulation Profile: No results for input(s): "INR", "PROTIME" in the last 168 hours. Cardiac Enzymes: No results for input(s): "CKTOTAL", "CKMB", "CKMBINDEX", "TROPONINI" in the last 168 hours. BNP (last 3 results) No results for input(s): "PROBNP" in the last 8760 hours. HbA1C: No results for input(s): "HGBA1C" in the last 72 hours. CBG: No results for input(s): "GLUCAP" in the last 168 hours. Lipid Profile: No results for input(s): "CHOL", "HDL", "LDLCALC", "  TRIG", "CHOLHDL", "LDLDIRECT" in the last 72 hours. Thyroid Function Tests: Recent Labs    05/17/23 0007  TSH 18.785*  FREET4 0.55*   Anemia Panel: No results for input(s): "VITAMINB12", "FOLATE", "FERRITIN", "TIBC", "IRON", "RETICCTPCT" in the last 72 hours. Urine analysis:    Component Value Date/Time   COLORURINE YELLOW 01/06/2023 1050   APPEARANCEUR CLEAR 01/06/2023 1050   LABSPEC 1.020 01/06/2023 1050   PHURINE 6.0 01/06/2023 1050   GLUCOSEU 250 (A) 01/06/2023 1050   HGBUR MODERATE (A) 01/06/2023 1050   BILIRUBINUR NEGATIVE 01/06/2023 1050   KETONESUR NEGATIVE 01/06/2023 1050   PROTEINUR >300 (A) 01/06/2023 1050   NITRITE NEGATIVE 01/06/2023 1050   LEUKOCYTESUR NEGATIVE 01/06/2023 1050    Radiological Exams on Admission: DG Chest Port 1 View Result Date: 05/16/2023 CLINICAL DATA:  Atrial fibrillation EXAM: PORTABLE CHEST 1 VIEW COMPARISON:  None Available. FINDINGS: Lungs are clear. No pneumothorax or pleural effusion. Cardiac size is mildly enlarged. Pulmonary vascularity is normal. No acute bone abnormality. IMPRESSION: 1. Mild cardiomegaly. Electronically Signed   By: Helyn Numbers M.D.   On: 05/16/2023 23:06    EKG: Independently reviewed.   Assessment and Plan: * Atrial fibrillation with RVR (HCC) New onset a.fib RVR Cardizem gtt for rate control Which is  paused at the moment since pt just converted out of a.fib Tele monitor Cont home metoprolol Stopping amlodipine (thinking he's going to wind up on either cardizem or coreg instead) CHADS-VASC = 3, does have reduced EF.  So may wish to start [facility] in patient for stroke prevention but ill defer this discussion to day team for the moment. Heparin [place] for the moment for DVT ppx Ordering 2d echo to see if EF changed in past 2 months. Gonna send consult / curbside to cards re- rate med recommendations in pt with HFrEF (usually they aren't huge fans of CCBs in this population).  ESRD (end stage renal disease) (HCC) Call nephro in AM for dialysis as he missed dialysis today due to new onset A.fib RVR Nephro may want to review home meds with patient, some of them look like maybe not the greatest choices in an ESRD patient ie: Ibuprofen Taking tums instead of calcitriol Aldactone  Hypothyroidism Pt with hypothyroidism on lab work today. Starting low dose synthroid Repeat labs in 4-6 weeks.  Dilated cardiomyopathy (HCC) HFrEF with EF 35-40% as of most recent echo. Cont metoprolol Cont entresto Hold aldactone for the moment  Type 2 diabetes mellitus without complication, without long-term current use of insulin (HCC) On chart but A1C of 5.2, doesn't look like he's on any meds for this.  Hypertension Cont metoprolol Cont entresto Hold amlodipine - either replace with cardizem or more likely a BB given HFrEF (will ask cards to weigh in on this since they follow him for BP). Hold aldactone - Dialysis pt on a K sparing diuretic? Might want to double check with nephrology first.      Advance Care Planning:   Code Status: Full Code  Consults: Message sent to [facility]. [facility] for cards eval in AM  Call nephro in AM for IP dialysis  Family Communication: No family in room  Severity of Illness: The appropriate patient status for this patient is OBSERVATION. Observation status is judged to be  reasonable and necessary in order to provide the required intensity of service to ensure the patient's safety. The patient's presenting symptoms, physical exam findings, and initial radiographic and laboratory data in the context of their medical condition is felt to  place them at decreased risk for further clinical deterioration. Furthermore, it is anticipated that the patient will be medically stable for discharge from the hospital within 2 midnights of admission.   Author: Hillary Bow., DO 05/17/2023 4:37 AM  For on call review [email or web address].

## 2023-05-17 NOTE — Assessment & Plan Note (Addendum)
New onset a.fib RVR Cardizem gtt for rate control Which is paused at the moment since pt just converted out of a.fib Tele monitor Cont home metoprolol Stopping amlodipine (thinking he's going to wind up on either cardizem or coreg instead) CHADS-VASC = 3, does have reduced EF.  So may wish to start [facility] in patient for stroke prevention but ill defer this discussion to day team for the moment. Heparin [place] for the moment for DVT ppx Ordering 2d echo to see if EF changed in past 2 months. Gonna send consult / curbside to cards re- rate med recommendations in pt with HFrEF (usually they aren't huge fans of CCBs in this population).

## 2023-05-17 NOTE — Telephone Encounter (Signed)
[name] can we please do a 2 week monitor for this patient to assess afib burden. Dr. Izora Ribas to read. Still inpatient but would expect DC in the next few days.

## 2023-05-17 NOTE — ED Notes (Signed)
ED TO INPATIENT HANDOFF REPORT  ED Nurse Name and Phone #: Gillis Ends [phone]  S Name/Age/Gender Rosezetta Schlatter 48 y.o. male Room/Bed: 047C/047C  Code Status   Code Status: Full Code  Home/SNF/Other Home Patient oriented to: self, place, time, and situation Is this baseline? Yes   Triage Complete: Triage complete  Chief Complaint Elevated TSH [R79.89] ESRD (end stage renal disease) on dialysis (HCC) [N18.6, Z99.2] Atrial fibrillation with RVR (HCC) [I48.91]  Triage Note Patient arrives via [facility] for afib rvr. Patient was checking vitals at home before dialysis and noticed his HR was high. EMS found patient in afib rvr with rate in the 170's. Patient states he had a "run" of afib in his 20's but no history since then. 20mg  Cardizem given 2159.    HR 90-140  BP 118/74   Allergies Allergies  Allergen Reactions   Ms Contin [Morphine] Anaphylaxis and Swelling    Full body swelling   Sulfa Antibiotics Swelling    Facial and eye swelling    Level of Care/Admitting Diagnosis ED Disposition     ED Disposition  Admit   Condition  --   Comment  Hospital Area: [facility] [[id number]]  Level of Care: Progressive [102]  Admit to Progressive based on following criteria: CARDIOVASCULAR & THORACIC of moderate stability with acute coronary syndrome symptoms/low risk myocardial infarction/hypertensive urgency/arrhythmias/heart failure potentially compromising stability and stable post cardiovascular intervention patients.  May place patient in observation at [facility] or [facility] if equivalent level of care is available:: No  Covid Evaluation: Asymptomatic - no recent exposure (last 10 days) testing not required  Diagnosis: Atrial fibrillation with RVR [facility]) [[id number]]  Admitting Physician: Hillary Bow [[id number]]  Attending Physician: Hillary Bow [[id number]]          B Medical/Surgery History Past Medical History:  Diagnosis Date   ESRD (end stage renal  disease) (HCC)    Hypertension    Past Surgical History:  Procedure Laterality Date   BIOPSY  12/22/2022   Procedure: BIOPSY;  Surgeon: Imogene Burn, MD;  Location: [facility];  Service: Gastroenterology;;   COLONOSCOPY WITH PROPOFOL N/A 12/22/2022   Procedure: COLONOSCOPY WITH PROPOFOL;  Surgeon: Imogene Burn, MD;  Location: [facility];  Service: Gastroenterology;  Laterality: N/A;   ESOPHAGOGASTRODUODENOSCOPY (EGD) WITH PROPOFOL N/A 12/22/2022   Procedure: ESOPHAGOGASTRODUODENOSCOPY (EGD) WITH PROPOFOL;  Surgeon: Imogene Burn, MD;  Location: [facility];  Service: Gastroenterology;  Laterality: N/A;   EYE SURGERY     POLYPECTOMY  12/22/2022   Procedure: POLYPECTOMY;  Surgeon: Imogene Burn, MD;  Location: [facility];  Service: Gastroenterology;;     A IV Location/Drains/Wounds Patient Lines/Drains/Airways Status     Active Line/Drains/Airways     Name Placement date Placement time Site Days   Peripheral IV 05/16/23 20 G 1.88" Anterior;Left Forearm 05/16/23  2350  Forearm  1   Fistula / Graft Right Forearm Arteriovenous fistula 01/06/23  0026  Forearm  131            Intake/Output Last 24 hours  Intake/Output Summary (Last 24 hours) at 05/17/2023 2316 Last data filed at 05/17/2023 1945 Gross per 24 hour  Intake --  Output 3000 ml  Net -3000 ml    Labs/Imaging Results for orders placed or performed during the hospital encounter of 05/16/23 (from the past 48 hours)  CBC with Differential     Status: Abnormal   Collection Time: 05/17/23 12:07 AM  Result Value Ref Range  WBC 4.2 4.0 - 10.5 K/uL   RBC 3.55 (L) 4.22 - 5.81 MIL/uL   Hemoglobin 10.3 (L) 13.0 - 17.0 g/dL   HCT 19.1 (L) 47.8 - 29.5 %   MCV 91.3 80.0 - 100.0 fL   MCH 29.0 26.0 - 34.0 pg   MCHC 31.8 30.0 - 36.0 g/dL   RDW 62.1 (H) 30.8 - 65.7 %   Platelets 144 (L) 150 - 400 K/uL   nRBC 0.0 0.0 - 0.2 %   Neutrophils Relative % 61 %   Neutro Abs 2.6 1.7 - 7.7 K/uL   Lymphocytes Relative 18 %    Lymphs Abs 0.8 0.7 - 4.0 K/uL   Monocytes Relative 15 %   Monocytes Absolute 0.7 0.1 - 1.0 K/uL   Eosinophils Relative 5 %   Eosinophils Absolute 0.2 0.0 - 0.5 K/uL   Basophils Relative 1 %   Basophils Absolute 0.0 0.0 - 0.1 K/uL   Immature Granulocytes 0 %   Abs Immature Granulocytes 0.01 0.00 - 0.07 K/uL    Comment: Performed at [facility], [address], [place], [place] [address]  Comprehensive metabolic panel     Status: Abnormal   Collection Time: 05/17/23 12:07 AM  Result Value Ref Range   Sodium 137 135 - 145 mmol/L   Potassium 4.7 3.5 - 5.1 mmol/L   Chloride 103 98 - 111 mmol/L   CO2 21 (L) 22 - 32 mmol/L   Glucose, Bld 150 (H) 70 - 99 mg/dL    Comment: Glucose reference range applies only to samples taken after fasting for at least 8 hours.   BUN 59 (H) 6 - 20 mg/dL   Creatinine, Ser 29.52 (H) 0.61 - 1.24 mg/dL   Calcium 8.3 (L) 8.9 - 10.3 mg/dL   Total Protein 6.6 6.5 - 8.1 g/dL   Albumin 3.7 3.5 - 5.0 g/dL   AST 20 15 - 41 U/L   ALT 28 0 - 44 U/L   Alkaline Phosphatase 52 38 - 126 U/L   Total Bilirubin 0.5 0.0 - 1.2 mg/dL   GFR, Estimated 5 (L) >60 mL/min    Comment: (NOTE) Calculated using the CKD-EPI Creatinine Equation (2021)    Anion gap 13 5 - 15    Comment: Performed at [facility], [address], [place], [place] [address]  Magnesium     Status: Abnormal   Collection Time: 05/17/23 12:07 AM  Result Value Ref Range   Magnesium 2.6 (H) 1.7 - 2.4 mg/dL    Comment: Performed at [facility], [address] [place], [place] [address]  TSH     Status: Abnormal   Collection Time: 05/17/23 12:07 AM  Result Value Ref Range   TSH 18.785 (H) 0.350 - 4.500 uIU/mL    Comment: Performed by a 3rd Generation assay with a functional sensitivity of <=0.01 uIU/mL. Performed at [facility], [address] [place], [place] [address]   T4, free     Status: Abnormal   Collection Time: 05/17/23 12:07 AM  Result Value Ref Range   Free T4 0.55  (L) 0.61 - 1.12 ng/dL    Comment: (NOTE) Biotin ingestion may interfere with free T4 tests. If the results are inconsistent with the TSH level, previous test results, or the clinical presentation, then consider biotin interference. If needed, order repeat testing after stopping biotin. Performed at [facility], [address] [place], [place] [address]   I-stat chem 8, ED (  not at [facility], [facility] or [facility])     Status: Abnormal   Collection Time: 05/17/23 12:23 AM  Result Value Ref Range   Sodium 137 135 - 145 mmol/L   Potassium 4.7 3.5 - 5.1 mmol/L   Chloride 105 98 - 111 mmol/L   BUN 53 (H) 6 - 20 mg/dL   Creatinine, Ser 16.10 (H) 0.61 - 1.24 mg/dL   Glucose, Bld 960 (H) 70 - 99 mg/dL    Comment: Glucose reference range applies only to samples taken after fasting for at least 8 hours.   Calcium, Ion 1.06 (L) 1.15 - 1.40 mmol/L   TCO2 21 (L) 22 - 32 mmol/L   Hemoglobin 11.6 (L) 13.0 - 17.0 g/dL   HCT 45.4 (L) 09.8 - 11.9 %  CBC     Status: Abnormal   Collection Time: 05/17/23  3:40 AM  Result Value Ref Range   WBC 3.9 (L) 4.0 - 10.5 K/uL   RBC 3.41 (L) 4.22 - 5.81 MIL/uL   Hemoglobin 9.9 (L) 13.0 - 17.0 g/dL   HCT 14.7 (L) 82.9 - 56.2 %   MCV 91.8 80.0 - 100.0 fL   MCH 29.0 26.0 - 34.0 pg   MCHC 31.6 30.0 - 36.0 g/dL   RDW 13.0 (H) 86.5 - 78.4 %   Platelets 150 150 - 400 K/uL   nRBC 0.0 0.0 - 0.2 %    Comment: Performed at [facility], [address] [place], [place] [address]  Basic metabolic panel     Status: Abnormal   Collection Time: 05/17/23  3:40 AM  Result Value Ref Range   Sodium 139 135 - 145 mmol/L   Potassium 4.7 3.5 - 5.1 mmol/L   Chloride 104 98 - 111 mmol/L   CO2 21 (L) 22 - 32 mmol/L   Glucose, Bld 158 (H) 70 - 99 mg/dL    Comment: Glucose reference range applies only to samples taken after fasting for at least 8 hours.   BUN 60 (H) 6 - 20 mg/dL   Creatinine, Ser 52.84 (H) 0.61 - 1.24 mg/dL   Calcium 8.5 (L) 8.9 - 10.3 mg/dL   GFR, Estimated 5  (L) >60 mL/min    Comment: (NOTE) Calculated using the CKD-EPI Creatinine Equation (2021)    Anion gap 14 5 - 15    Comment: Performed at [facility], [address] [place], [place] [address]  Hepatitis B surface antigen     Status: None   Collection Time: 05/17/23  3:30 PM  Result Value Ref Range   Hepatitis B Surface Ag NON REACTIVE NON REACTIVE    Comment: Performed at [facility], [address] [place], [place] [address]  CBG monitoring, ED     Status: None   Collection Time: 05/17/23 11:13 PM  Result Value Ref Range   Glucose-Capillary 93 70 - 99 mg/dL    Comment: Glucose reference range applies only to samples taken after fasting for at least 8 hours.   ECHOCARDIOGRAM COMPLETE Result Date: 05/17/2023    ECHOCARDIOGRAM REPORT   Patient Name:   KIPLING LUMBERT [facility] Date of Exam: 05/17/2023 Medical Rec #:  [id number]        Height:       69.0 in Accession #:    [id number]       Weight:       207.9 lb Date of Birth:  06/22/1975        BSA:  2.100 m Patient Age:    47 years         BP:           148/84 mmHg Patient Gender: M                HR:           78 bpm. Exam Location:  Inpatient Procedure: 2D Echo, Cardiac Doppler and Color Doppler Indications:    Atrial Fibrillation I48.91  History:        Patient has prior history of Echocardiogram examinations, most                 recent 01/06/2023. Risk Factors:Hypertension.  Sonographer:    Darlys Gales Referring Phys: [phone] JARED M GARDNER IMPRESSIONS  1. Left ventricular ejection fraction, by estimation, is 45 to 50%. The left ventricle has mildly decreased function. The left ventricle demonstrates global hypokinesis. There is mild left ventricular hypertrophy. Left ventricular diastolic parameters are consistent with Grade III diastolic dysfunction (restrictive). Elevated left atrial pressure.  2. Right ventricular systolic function is normal. The right ventricular size is mildly enlarged. Tricuspid regurgitation signal is  inadequate for assessing PA pressure.  3. Left atrial size was moderately dilated.  4. The mitral valve is normal in structure. Trivial mitral valve regurgitation.  5. The aortic valve is tricuspid. Aortic valve regurgitation is trivial. No aortic stenosis is present.  6. The inferior vena cava is dilated in size with <50% respiratory variability, suggesting right atrial pressure of 15 mmHg. FINDINGS  Left Ventricle: Left ventricular ejection fraction, by estimation, is 45 to 50%. The left ventricle has mildly decreased function. The left ventricle demonstrates global hypokinesis. The left ventricular internal cavity size was normal in size. There is  mild left ventricular hypertrophy. Left ventricular diastolic parameters are consistent with Grade III diastolic dysfunction (restrictive). Elevated left atrial pressure. Right Ventricle: The right ventricular size is mildly enlarged. Right vetricular wall thickness was not well visualized. Right ventricular systolic function is normal. Tricuspid regurgitation signal is inadequate for assessing PA pressure. Left Atrium: Left atrial size was moderately dilated. Right Atrium: Right atrial size was normal in size. Pericardium: There is no evidence of pericardial effusion. Mitral Valve: The mitral valve is normal in structure. Trivial mitral valve regurgitation. Tricuspid Valve: The tricuspid valve is normal in structure. Tricuspid valve regurgitation is trivial. Aortic Valve: The aortic valve is tricuspid. Aortic valve regurgitation is trivial. No aortic stenosis is present. Aortic valve mean gradient measures 5.0 mmHg. Aortic valve peak gradient measures 8.9 mmHg. Aortic valve area, by VTI measures 2.57 cm. Pulmonic Valve: The pulmonic valve was normal in structure. Pulmonic valve regurgitation is trivial. Aorta: The aortic root is normal in size and structure. Venous: The inferior vena cava is dilated in size with less than 50% respiratory variability, suggesting right  atrial pressure of 15 mmHg. IAS/Shunts: The interatrial septum was not well visualized.  LEFT VENTRICLE PLAX 2D LVIDd:         5.10 cm   Diastology LVIDs:         3.70 cm   LV e' medial:   6.20 cm/s LV PW:         1.50 cm   LV E/e' medial: 21.9 LV IVS:        1.10 cm LVOT diam:     2.00 cm LV SV:         77 LV SV Index:   37 LVOT Area:     3.14  cm  RIGHT VENTRICLE RV S prime:     12.60 cm/s TAPSE (M-mode): 2.8 cm LEFT ATRIUM              Index        RIGHT ATRIUM           Index LA Vol (A2C):   125.0 ml 59.52 ml/m  RA Area:     16.40 cm LA Vol (A4C):   82.0 ml  39.04 ml/m  RA Volume:   41.10 ml  19.57 ml/m LA Biplane Vol: 102.0 ml 48.57 ml/m  AORTIC VALVE AV Area (Vmax):    2.34 cm AV Area (Vmean):   2.51 cm AV Area (VTI):     2.57 cm AV Vmax:           149.00 cm/s AV Vmean:          108.000 cm/s AV VTI:            0.299 m AV Peak Grad:      8.9 mmHg AV Mean Grad:      5.0 mmHg LVOT Vmax:         111.00 cm/s LVOT Vmean:        86.400 cm/s LVOT VTI:          0.245 m LVOT/AV VTI ratio: 0.82  AORTA Ao Root diam: 3.40 cm MITRAL VALVE MV Area (PHT): 4.00 cm     SHUNTS MV E velocity: 136.00 cm/s  Systemic VTI:  0.24 m MV A velocity: 50.10 cm/s   Systemic Diam: 2.00 cm MV E/A ratio:  2.71 Epifanio Lesches MD Electronically signed by Epifanio Lesches MD Signature Date/Time: 05/17/2023/5:49:09 PM    Final    DG Chest Port 1 View Result Date: 05/16/2023 CLINICAL DATA:  Atrial fibrillation EXAM: PORTABLE CHEST 1 VIEW COMPARISON:  None Available. FINDINGS: Lungs are clear. No pneumothorax or pleural effusion. Cardiac size is mildly enlarged. Pulmonary vascularity is normal. No acute bone abnormality. IMPRESSION: 1. Mild cardiomegaly. Electronically Signed   By: Helyn Numbers M.D.   On: 05/16/2023 23:06    Pending Labs Unresulted Labs (From admission, onward)     Start     Ordered   05/18/23 0500  Comprehensive metabolic panel  Tomorrow morning,   R        05/17/23 1442   05/18/23 0500  CBC with  Differential/Platelet  Tomorrow morning,   R        05/17/23 1442   05/18/23 0500  Magnesium  Tomorrow morning,   R        05/17/23 1442   05/18/23 0500  Phosphorus  Tomorrow morning,   R        05/17/23 1442   05/17/23 1244  Hepatitis B surface antibody,quantitative  (New Admission Hemo Labs (Hepatitis B))  ONCE - URGENT,   URGENT        05/17/23 1244            Vitals/Pain Today's Vitals   05/17/23 1945 05/17/23 2230 05/17/23 2245 05/17/23 2300  BP: (!) 165/86 (!) 146/77 (!) 153/86   Pulse: 80 79 82   Resp: (!) 9     Temp: 98.5 F (36.9 C)   98.5 F (36.9 C)  TempSrc: Oral   Oral  SpO2: 100% 100% 100%   Weight:      Height:      PainSc:        Isolation Precautions No active isolations  Medications Medications  diltiazem (CARDIZEM) 125 mg  in dextrose 5% 125 mL (1 mg/mL) infusion (0 mg/hr Intravenous Stopped 05/17/23 0315)  acetaminophen (TYLENOL) tablet 650 mg (has no administration in time range)    Or  acetaminophen (TYLENOL) suppository 650 mg (has no administration in time range)  metoprolol succinate (TOPROL-XL) 24 hr tablet 25 mg (25 mg Oral Given 05/17/23 1003)  sacubitril-valsartan (ENTRESTO) 49-51 mg per tablet (1 tablet Oral Given 05/17/23 1003)  metoCLOPramide (REGLAN) tablet 5 mg (has no administration in time range)  ferric citrate (AURYXIA) tablet 420 mg (420 mg Oral Given 05/17/23 1003)  doxazosin (CARDURA) tablet 2 mg (has no administration in time range)  pantoprazole (PROTONIX) EC tablet 40 mg (40 mg Oral Given 05/17/23 1003)  levothyroxine (SYNTHROID) tablet 25 mcg (25 mcg Oral Given 05/17/23 0519)  calcitRIOL (ROCALTROL) capsule 0.5 mcg (0.5 mcg Oral Given 05/17/23 1209)  Chlorhexidine Gluconate Cloth 2 % PADS 6 each (has no administration in time range)  apixaban (ELIQUIS) tablet 5 mg (has no administration in time range)  heparin injection 4,000 Units (4,000 Units Dialysis Given 05/17/23 1541)  heparin injection 2,000 Units (2,000 Units Dialysis  Given 05/17/23 1738)    Mobility walks     Focused Assessments Renal Assessment Handoff:  Hemodialysis Schedule: Hemodialysis Schedule: Monday/Wednesday/Friday Last Hemodialysis date and time: 05/17/23   Restricted appendage: right arm   R Recommendations: See Admitting Provider Note  Report given to:   Additional Notes:

## 2023-05-17 NOTE — Assessment & Plan Note (Signed)
Call nephro in AM for dialysis as he missed dialysis today due to new onset A.fib RVR Nephro may want to review home meds with patient, some of them look like maybe not the greatest choices in an ESRD patient ie: Ibuprofen Taking tums instead of calcitriol Aldactone

## 2023-05-17 NOTE — Consult Note (Signed)
Renal Service Consult Note [facility]  EL PENSINGER 05/17/2023 Tim Krabbe, MD Requesting Physician: Dr. Marland Mcalpine  Reason for Consult: ESRD pt w/ new atrial fib HPI: The patient is a 48 y.o. year-old w/ PMH as below who presented to ED from home due to noting a high HR (170s) prior to starting his home HD session. He came to ED early this morning and HR was 120s w/ atrial fibrillation. Pt rec'd IV cardizem bolus en route and cardizem gtt when he got here. He is on home HD, missed HD yest (Tuesday) was going to make it up today. Pt to be admitted. Nephrology consulted for dialysis.   Pt seen in ED. Pt here for high HR noted when at home. Asymptomatic. Denies any SOB, CP or fevers, no cough. No swelling of legs or abdomen.   Pt is from [place] area, has been on home HD for 2-3 years. He moved to [place] 1 yr ago but still sees his nephrologist from [place] area, Dr. Eliseo Squires [facility]).  ROS - denies CP, no joint pain, no HA, no blurry vision, no rash, no diarrhea, no nausea/ vomiting, no dysuria, no difficulty voiding   Past Medical History  Past Medical History:  Diagnosis Date   ESRD (end stage renal disease) (HCC)    Hypertension    Past Surgical History  Past Surgical History:  Procedure Laterality Date   BIOPSY  12/22/2022   Procedure: BIOPSY;  Surgeon: Imogene Burn, MD;  Location: [facility];  Service: Gastroenterology;;   COLONOSCOPY WITH PROPOFOL N/A 12/22/2022   Procedure: COLONOSCOPY WITH PROPOFOL;  Surgeon: Imogene Burn, MD;  Location: [facility];  Service: Gastroenterology;  Laterality: N/A;   ESOPHAGOGASTRODUODENOSCOPY (EGD) WITH PROPOFOL N/A 12/22/2022   Procedure: ESOPHAGOGASTRODUODENOSCOPY (EGD) WITH PROPOFOL;  Surgeon: Imogene Burn, MD;  Location: [facility];  Service: Gastroenterology;  Laterality: N/A;   EYE SURGERY     POLYPECTOMY  12/22/2022   Procedure: POLYPECTOMY;  Surgeon: Imogene Burn, MD;  Location:  [facility];  Service: Gastroenterology;;   Family History  Family History  Problem Relation Age of Onset   Diabetes Mother    Lung disease Father    Liver disease Neg Hx    Esophageal cancer Neg Hx    Colon cancer Neg Hx    Social History  reports that he has never smoked. He has never used smokeless tobacco. No history on file for alcohol use and drug use. Allergies  Allergies  Allergen Reactions   Ms Contin [Morphine] Anaphylaxis and Swelling    Full body swelling   Sulfa Antibiotics Swelling    Facial and eye swelling   Home medications Prior to Admission medications   Medication Sig Start Date End Date Taking? Authorizing Provider  amLODipine (NORVASC) 10 MG tablet Take 10 mg by mouth daily. 05/12/23  Yes [provider]  calcium carbonate (TUMS - DOSED IN MG ELEMENTAL CALCIUM) 500 MG chewable tablet Chew 1,500 tablets by mouth daily.   Yes [provider]  Cyanocobalamin (VITAMIN B-12 PO) Take 1 tablet by mouth daily.   Yes [provider]  doxazosin (CARDURA) 2 MG tablet Take 2 mg by mouth at bedtime. 08/04/22  Yes [provider]  ferric citrate (AURYXIA) 1 GM 210 MG(Fe) tablet Take 420 mg by mouth daily before breakfast.   Yes [provider]  ibuprofen (ADVIL) 200 MG tablet Take 400 mg by mouth 2 (two) times daily as needed for headache  or moderate pain.   Yes [provider]  metoCLOPramide (REGLAN) 5 MG tablet Take 1 tablet (5 mg total) by mouth every 6 (six) hours as needed for nausea or vomiting. 01/12/23  Yes Burnadette Pop, MD  metoprolol succinate (TOPROL-XL) 25 MG 24 hr tablet Take 1 tablet by mouth daily. 03/14/23  Yes [provider]  pantoprazole (PROTONIX) 40 MG tablet Take 1 tablet (40 mg total) by mouth 2 (two) times daily before a meal. 12/22/22  Yes Imogene Burn, MD  sacubitril-valsartan (ENTRESTO) 49-51 MG Take 1 tablet by mouth 2 (two) times daily. 05/10/23  Yes Asa Lente, Tessa N, PA-C   spironolactone (ALDACTONE) 25 MG tablet Take 25 mg by mouth daily. 05/10/23  Yes [provider]  calcitRIOL (ROCALTROL) 0.5 MCG capsule Take 0.5 mcg by mouth daily. Patient not taking: Reported on 05/17/2023    [provider]     Vitals:   05/17/23 0600 05/17/23 0615 05/17/23 0630 05/17/23 0645  BP: 125/75 (!) 140/85 127/78 123/78  Pulse: 62 68 64 65  Resp: 14 13 14 14   Temp:      TempSrc:      SpO2: 100% 100% 100% 100%  Weight:      Height:       Exam Gen alert, no distress No rash, cyanosis or gangrene Sclera anicteric, throat clear  No jvd or bruits Chest clear bilat to bases, no rales/ wheezing RRR no MRG Abd soft ntnd no mass or ascites +bs GU nl male MS no joint effusions or deformity Ext no LE or UE edema, no other edema Neuro is alert, Ox 3 , nf    RFA AVF+bruit       Renal-related home meds: - aldactone 25/ norvasc10 / cardura 2 hs/ toprol xl 25/ entresto bid - rocaltrol 0.5 mcg daily/ auryxia 2 ac tid - others: PPI, reglan, advil, tums    OP HD: NxStage HHD - [facility]. Follows by [employer], phone: [phone]. Lives in [place], moved here 1 yr ago.   3h  89kg  RFA AVF  Heparin 6000 bolus    Assessment/ Plan: New onset atrial fibrillation w/ RVR - started on IV diltiazem per pmd. Echo pending. Per pmd/ cardiology.  ESRD - on home HD MTThF. Missed HD Monday. Plan HD today / tonight HTN - bp's normal to slightly high.   Volume - on exam is euvolemic. Thinks he may be 4-5kg up.  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 Follow.  Secondary hyperparathyroidism - CCa at low end of range. Add on phos. Cont binders and po vdra.    Vinson Moselle  MD [facility] 05/17/2023, 9:58 AM  Recent Labs  Lab 05/17/23 0007 05/17/23 0023 05/17/23 0340  HGB 10.3* 11.6* 9.9*  ALBUMIN 3.7  --   --   CALCIUM 8.3*  --  8.5*  CREATININE 10.83* 12.00* 11.42*  K 4.7 4.7 4.7   Inpatient medications:  doxazosin  2 mg Oral QHS   ferric  citrate  420 mg Oral QAC breakfast   heparin injection (subcutaneous)  5,000 Units Subcutaneous Q8H   levothyroxine  25 mcg Oral Q0600   metoprolol succinate  25 mg Oral Daily   pantoprazole  40 mg Oral BID AC   sacubitril-valsartan  1 tablet Oral BID    diltiazem (CARDIZEM) infusion Stopped (05/17/23 0315)   acetaminophen **OR** acetaminophen, metoCLOPramide

## 2023-05-17 NOTE — Assessment & Plan Note (Signed)
On chart but A1C of 5.2, doesn't look like he's on any meds for this.

## 2023-05-18 DIAGNOSIS — I5022 Chronic systolic (congestive) heart failure: Secondary | ICD-10-CM | POA: Diagnosis not present

## 2023-05-18 DIAGNOSIS — I10 Essential (primary) hypertension: Secondary | ICD-10-CM | POA: Diagnosis not present

## 2023-05-18 DIAGNOSIS — I420 Dilated cardiomyopathy: Secondary | ICD-10-CM | POA: Diagnosis not present

## 2023-05-18 DIAGNOSIS — I4891 Unspecified atrial fibrillation: Secondary | ICD-10-CM | POA: Diagnosis not present

## 2023-05-18 DIAGNOSIS — N186 End stage renal disease: Secondary | ICD-10-CM | POA: Diagnosis not present

## 2023-05-18 LAB — CBC WITH DIFFERENTIAL/PLATELET
Abs Immature Granulocytes: 0.02 10*3/uL (ref 0.00–0.07)
Basophils Absolute: 0 10*3/uL (ref 0.0–0.1)
Basophils Relative: 2 %
Eosinophils Absolute: 0.2 10*3/uL (ref 0.0–0.5)
Eosinophils Relative: 6 %
HCT: 35.9 % — ABNORMAL LOW (ref 39.0–52.0)
Hemoglobin: 11.7 g/dL — ABNORMAL LOW (ref 13.0–17.0)
Immature Granulocytes: 1 %
Lymphocytes Relative: 27 %
Lymphs Abs: 0.7 10*3/uL (ref 0.7–4.0)
MCH: 29.3 pg (ref 26.0–34.0)
MCHC: 32.6 g/dL (ref 30.0–36.0)
MCV: 90 fL (ref 80.0–100.0)
Monocytes Absolute: 0.6 10*3/uL (ref 0.1–1.0)
Monocytes Relative: 22 %
Neutro Abs: 1.1 10*3/uL — ABNORMAL LOW (ref 1.7–7.7)
Neutrophils Relative %: 42 %
Platelets: 122 10*3/uL — ABNORMAL LOW (ref 150–400)
RBC: 3.99 MIL/uL — ABNORMAL LOW (ref 4.22–5.81)
RDW: 16 % — ABNORMAL HIGH (ref 11.5–15.5)
WBC: 2.7 10*3/uL — ABNORMAL LOW (ref 4.0–10.5)
nRBC: 0 % (ref 0.0–0.2)

## 2023-05-18 LAB — COMPREHENSIVE METABOLIC PANEL
ALT: 28 U/L (ref 0–44)
AST: 23 U/L (ref 15–41)
Albumin: 3.2 g/dL — ABNORMAL LOW (ref 3.5–5.0)
Alkaline Phosphatase: 45 U/L (ref 38–126)
Anion gap: 9 (ref 5–15)
BUN: 38 mg/dL — ABNORMAL HIGH (ref 6–20)
CO2: 25 mmol/L (ref 22–32)
Calcium: 8.2 mg/dL — ABNORMAL LOW (ref 8.9–10.3)
Chloride: 99 mmol/L (ref 98–111)
Creatinine, Ser: 8.97 mg/dL — ABNORMAL HIGH (ref 0.61–1.24)
GFR, Estimated: 7 mL/min — ABNORMAL LOW (ref >60)
Glucose, Bld: 102 mg/dL — ABNORMAL HIGH (ref 70–99)
Potassium: 4.4 mmol/L (ref 3.5–5.1)
Sodium: 133 mmol/L — ABNORMAL LOW (ref 135–145)
Total Bilirubin: 0.8 mg/dL (ref 0.0–1.2)
Total Protein: 6.1 g/dL — ABNORMAL LOW (ref 6.5–8.1)

## 2023-05-18 LAB — GLUCOSE, CAPILLARY
Glucose-Capillary: 129 mg/dL — ABNORMAL HIGH (ref 70–99)
Glucose-Capillary: 107 mg/dL — ABNORMAL HIGH (ref 70–99)

## 2023-05-18 LAB — HEPATITIS B SURFACE ANTIBODY, QUANTITATIVE: Hep B S AB Quant (Post): <3.5 m[IU]/mL — ABNORMAL LOW

## 2023-05-18 LAB — MAGNESIUM: Magnesium: 2.3 mg/dL (ref 1.7–2.4)

## 2023-05-18 LAB — MRSA NEXT GEN BY PCR, NASAL: MRSA by PCR Next Gen: NOT DETECTED

## 2023-05-18 LAB — PHOSPHORUS: Phosphorus: 4.7 mg/dL — ABNORMAL HIGH (ref 2.5–4.6)

## 2023-05-18 MED ORDER — ACETAMINOPHEN 325 MG PO TABS: 650.0000 mg | ORAL_TABLET | Freq: Four times a day (QID) | ORAL | 0 refills | Status: DC | PRN

## 2023-05-18 MED ORDER — LEVOTHYROXINE SODIUM 25 MCG PO TABS: 25.0000 ug | ORAL_TABLET | Freq: Every day | ORAL | 0 refills | Status: DC

## 2023-05-18 MED ORDER — APIXABAN 5 MG PO TABS: 5.0000 mg | ORAL_TABLET | Freq: Two times a day (BID) | ORAL | 0 refills | Status: DC

## 2023-05-18 MED ORDER — METOPROLOL SUCCINATE ER 50 MG PO TB24: 50.0000 mg | ORAL_TABLET | Freq: Every day | ORAL | 0 refills | Status: DC

## 2023-05-18 MED ORDER — METOPROLOL SUCCINATE ER 50 MG PO TB24: 50.0000 mg | ORAL_TABLET | Freq: Every day | ORAL | Status: DC

## 2023-05-18 MED ORDER — METOPROLOL SUCCINATE ER 25 MG PO TB24
25.0000 mg | ORAL_TABLET | Freq: Once | ORAL | Status: AC
Start: 2023-05-18 — End: 2023-05-18
  Administered 2023-05-18: 25 mg via ORAL
  Filled 2023-05-18: qty 1

## 2023-05-18 NOTE — Progress Notes (Signed)
[facility] Progress Note   Subjective:  Completed dialysis yesterday net UF 3L -no issues SR on monitor. No cp/sob. Wants to change diet Questions about using heparin for HD while on Eliquis   Objective Vitals:   05/17/23 2300 05/17/23 2315 05/18/23 0349 05/18/23 0824  BP: (!) 155/87 (!) 158/85 (!) 142/94 (!) 154/94  Pulse: 80 82 73 76  Resp: 20 20 14 12   Temp: 98.5 F (36.9 C) 98.2 F (36.8 C) 97.9 F (36.6 C) 97.9 F (36.6 C)  TempSrc: Oral Oral Oral Oral  SpO2: 100% 100% 100% 100%  Weight: 91.5 kg     Height:         Additional Objective Labs: Basic Metabolic Panel: Recent Labs  Lab 05/17/23 0007 05/17/23 0023 05/17/23 0340 05/18/23 0818  NA 137 137 139 133*  K 4.7 4.7 4.7 4.4  CL 103 105 104 99  CO2 21*  --  21* 25  GLUCOSE 150* 145* 158* 102*  BUN 59* 53* 60* 38*  CREATININE 10.83* 12.00* 11.42* 8.97*  CALCIUM 8.3*  --  8.5* 8.2*  PHOS  --   --   --  4.7*   CBC: Recent Labs  Lab 05/17/23 0007 05/17/23 0023 05/17/23 0340 05/18/23 0727  WBC 4.2  --  3.9* 2.7*  NEUTROABS 2.6  --   --  1.1*  HGB 10.3* 11.6* 9.9* 11.7*  HCT 32.4* 34.0* 31.3* 35.9*  MCV 91.3  --  91.8 90.0  PLT 144*  --  150 122*   Blood Culture    Component Value Date/Time   SDES EXPECTORATED SPUTUM 01/06/2023 0050   SDES EXPECTORATED SPUTUM 01/06/2023 0050   SPECREQUEST NONE 01/06/2023 0050   SPECREQUEST NONE Reflexed from [id number] 01/06/2023 0050   CULT  01/06/2023 0050    Normal respiratory flora-no Staph aureus or Pseudomonas seen Performed at [facility], [address], [place], [place] [address]    REPTSTATUS 01/06/2023 FINAL 01/06/2023 0050   REPTSTATUS 01/09/2023 FINAL 01/06/2023 0050    Physical Exam General: Sitting up in bed, nad Heart: RRR Lungs: Clear bilaterally Abdomen: soft non-tender Extremities: No sig LE edema  Dialysis Access: R AVF +bruit   Medications:   apixaban  5 mg Oral BID   calcitRIOL  0.5 mcg Oral Daily    Chlorhexidine Gluconate Cloth  6 each Topical Q0600   doxazosin  2 mg Oral QHS   ferric citrate  420 mg Oral QAC breakfast   levothyroxine  25 mcg Oral Q0600   metoprolol succinate  25 mg Oral Once   [START ON 05/19/2023] metoprolol succinate  50 mg Oral Daily   pantoprazole  40 mg Oral BID AC   sacubitril-valsartan  1 tablet Oral BID    OP HD: [facility] - [facility]. Follows by [employer], phone: [phone]. Lives in [place], moved here 1 yr ago.  3h  89kg  RFA AVF  Heparin 6000 bolus    Assessment/Plan: New onset atrial fibrillation w/ RVR - s/p IV diltiazem. Has converted to SR. Metoprolol adjusted. Eliquis started. Cardiology signed off.  NICM - EF 45-50%  ESRD - Home HD.  Missed HD Monday. Had HD Wed night. Next HD Friday.   Generally when ESRD pt on Eliquis still recommended to continue heparin bolus with HD to prevent circuit clotting. May need heparin dose adjustment in the future, will need to be monitored. He wants to see if he can do incenter dialysis for a week to see  how he responds -will f/u with renal navigator to see if this can be arranged.  HTN - bp's normal to slightly high.   Volume - on exam is euvolemic. Not yet to EDW. UF as able.  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 Follow.  Secondary hyperparathyroidism - CCa at low end of range. Phos ok. Cont binders and po vdra.     Tim Blase PA-C [facility] 05/18/2023,11:57 AM

## 2023-05-18 NOTE — Discharge Instructions (Signed)

## 2023-05-18 NOTE — Discharge Planning (Signed)
[facility]  Transient Hemodialysis Orders  Dialysis center: [facility]  Patient's name: ABAN ZAJICEK DOB: 10/14/1975 ESRD  Discharge diagnosis: -New onset Afib -Eliquis started. F/u with cardiology for Zio monitor  -Home HD pt requesting a few treatments in center following new dx, new AC meds   Allergies:  Allergies  Allergen Reactions   Ms Contin [Morphine] Anaphylaxis and Swelling    Full body swelling   Sulfa Antibiotics Swelling    Facial and eye swelling   Date of First Dialysis: -- Cause of renal disease: DM/HTN   Dialysis Prescription: Dialysis Frequency: TIW Tx duration: 3.5H BFR: 400 DFR: 600 EDW: 89 kg   Dialyzer: 180NRe UF profile/Sodium modeling?: -- Dialysis Bath: 2 K 2 Ca  Dialysis access: Access type:  RUE AVF Date placed: -- Surgeon: -- Needle gauge: 15g x2   In Center Medications: Heparin Dose: 6000 U Bolus  T VDRA: -- Venofer: -- Mircera: --  Discharge labs: Hgb: 11.7 K+: 4.4  Ca: 8.2  Phos: 4.7 Alb: 3.2  Please draw routine labs. Additional labs needed: -- Additional notes/follow-up: Home HD with [facility]   Megan Mans Katrine Radich PA-C

## 2023-05-18 NOTE — Progress Notes (Signed)
Progress Note  Patient Name: [name] Date of Encounter: 05/18/2023  Primary Cardiologist: Christell Constant, MD  Subjective   Feeling well without acute complaint today. No CP or SOB. Remains in NSR.  Inpatient Medications    Scheduled Meds:  apixaban  5 mg Oral BID   calcitRIOL  0.5 mcg Oral Daily   Chlorhexidine Gluconate Cloth  6 each Topical Q0600   doxazosin  2 mg Oral QHS   ferric citrate  420 mg Oral QAC breakfast   levothyroxine  25 mcg Oral Q0600   metoprolol succinate  25 mg Oral Daily   pantoprazole  40 mg Oral BID AC   sacubitril-valsartan  1 tablet Oral BID   Continuous Infusions:  diltiazem (CARDIZEM) infusion Stopped (05/17/23 0315)   PRN Meds: acetaminophen **OR** acetaminophen, metoCLOPramide   Vital Signs    Vitals:   05/17/23 2300 05/17/23 2315 05/18/23 0349 05/18/23 0824  BP: (!) 155/87 (!) 158/85 (!) 142/94 (!) 154/94  Pulse: 80 82 73 76  Resp: 20 20 14 12   Temp: 98.5 F (36.9 C) 98.2 F (36.8 C) 97.9 F (36.6 C) 97.9 F (36.6 C)  TempSrc: Oral Oral Oral Oral  SpO2: 100% 100% 100% 100%  Weight: 91.5 kg     Height:        Intake/Output Summary (Last 24 hours) at 05/18/2023 0925 Last data filed at 05/17/2023 1945 Gross per 24 hour  Intake --  Output 3000 ml  Net -3000 ml      05/17/2023   11:00 PM 05/17/2023    3:20 PM 05/16/2023   10:30 PM  Last 3 Weights  Weight (lbs) 201 lb 11.2 oz -- 207 lb 14.3 oz  Weight (kg) 91.491 kg -- 94.3 kg     Telemetry    NSR - Personally Reviewed  Physical Exam   GEN: No acute distress.  HEENT: Normocephalic, atraumatic, sclera non-icteric. Neck: No JVD or bruits. Cardiac: RRR no murmurs, rubs, or gallops.  Respiratory: Clear to auscultation bilaterally. Breathing is unlabored. GI: Soft, nontender, non-distended, BS +x 4. MS: no deformity. Extremities: No clubbing or cyanosis. No edema. Distal pedal pulses are 2+ and equal bilaterally. Neuro:  AAOx3. Follows commands. Psych:   Responds to questions appropriately with a normal affect.  Labs    High Sensitivity Troponin:  No results for input(s): "TROPONINIHS" in the last 720 hours.    Cardiac EnzymesNo results for input(s): "TROPONINI" in the last 168 hours. No results for input(s): "TROPIPOC" in the last 168 hours.   Chemistry Recent Labs  Lab 05/17/23 0007 05/17/23 0023 05/17/23 0340 05/18/23 0818  NA 137 137 139 133*  K 4.7 4.7 4.7 4.4  CL 103 105 104 99  CO2 21*  --  21* 25  GLUCOSE 150* 145* 158* 102*  BUN 59* 53* 60* 38*  CREATININE 10.83* 12.00* 11.42* 8.97*  CALCIUM 8.3*  --  8.5* 8.2*  PROT 6.6  --   --  6.1*  ALBUMIN 3.7  --   --  3.2*  AST 20  --   --  23  ALT 28  --   --  28  ALKPHOS 52  --   --  45  BILITOT 0.5  --   --  0.8  GFRNONAA 5*  --  5* 7*  ANIONGAP 13  --  14 9     Hematology Recent Labs  Lab 05/17/23 0007 05/17/23 0023 05/17/23 0340 05/18/23 0727  WBC 4.2  --  3.9* 2.7*  RBC 3.55*  --  3.41* 3.99*  HGB 10.3* 11.6* 9.9* 11.7*  HCT 32.4* 34.0* 31.3* 35.9*  MCV 91.3  --  91.8 90.0  MCH 29.0  --  29.0 29.3  MCHC 31.8  --  31.6 32.6  RDW 16.0*  --  16.0* 16.0*  PLT 144*  --  150 122*    BNPNo results for input(s): "BNP", "PROBNP" in the last 168 hours.   DDimer No results for input(s): "DDIMER" in the last 168 hours.   Radiology    ECHOCARDIOGRAM COMPLETE Result Date: 05/17/2023    ECHOCARDIOGRAM REPORT   Patient Name:   [name] [facility] Date of Exam: 05/17/2023 Medical Rec #:  [id number]        Height:       69.0 in Accession #:    [id number]       Weight:       207.9 lb Date of Birth:  21-Jul-1975        BSA:          2.100 m Patient Age:    48 years         BP:           148/84 mmHg Patient Gender: M                HR:           78 bpm. Exam Location:  Inpatient Procedure: 2D Echo, Cardiac Doppler and Color Doppler Indications:    Atrial Fibrillation I48.91  History:        Patient has prior history of Echocardiogram examinations, most                 recent  01/06/2023. Risk Factors:Hypertension.  Sonographer:    Darlys Gales Referring Phys: [phone] JARED M GARDNER IMPRESSIONS  1. Left ventricular ejection fraction, by estimation, is 45 to 50%. The left ventricle has mildly decreased function. The left ventricle demonstrates global hypokinesis. There is mild left ventricular hypertrophy. Left ventricular diastolic parameters are consistent with Grade III diastolic dysfunction (restrictive). Elevated left atrial pressure.  2. Right ventricular systolic function is normal. The right ventricular size is mildly enlarged. Tricuspid regurgitation signal is inadequate for assessing PA pressure.  3. Left atrial size was moderately dilated.  4. The mitral valve is normal in structure. Trivial mitral valve regurgitation.  5. The aortic valve is tricuspid. Aortic valve regurgitation is trivial. No aortic stenosis is present.  6. The inferior vena cava is dilated in size with <50% respiratory variability, suggesting right atrial pressure of 15 mmHg. FINDINGS  Left Ventricle: Left ventricular ejection fraction, by estimation, is 45 to 50%. The left ventricle has mildly decreased function. The left ventricle demonstrates global hypokinesis. The left ventricular internal cavity size was normal in size. There is  mild left ventricular hypertrophy. Left ventricular diastolic parameters are consistent with Grade III diastolic dysfunction (restrictive). Elevated left atrial pressure. Right Ventricle: The right ventricular size is mildly enlarged. Right vetricular wall thickness was not well visualized. Right ventricular systolic function is normal. Tricuspid regurgitation signal is inadequate for assessing PA pressure. Left Atrium: Left atrial size was moderately dilated. Right Atrium: Right atrial size was normal in size. Pericardium: There is no evidence of pericardial effusion. Mitral Valve: The mitral valve is normal in structure. Trivial mitral valve regurgitation. Tricuspid Valve: The  tricuspid valve is normal in structure. Tricuspid valve regurgitation is trivial. Aortic Valve: The aortic valve is tricuspid. Aortic valve regurgitation is trivial. No aortic  stenosis is present. Aortic valve mean gradient measures 5.0 mmHg. Aortic valve peak gradient measures 8.9 mmHg. Aortic valve area, by VTI measures 2.57 cm. Pulmonic Valve: The pulmonic valve was normal in structure. Pulmonic valve regurgitation is trivial. Aorta: The aortic root is normal in size and structure. Venous: The inferior vena cava is dilated in size with less than 50% respiratory variability, suggesting right atrial pressure of 15 mmHg. IAS/Shunts: The interatrial septum was not well visualized.  LEFT VENTRICLE PLAX 2D LVIDd:         5.10 cm   Diastology LVIDs:         3.70 cm   LV e' medial:   6.20 cm/s LV PW:         1.50 cm   LV E/e' medial: 21.9 LV IVS:        1.10 cm LVOT diam:     2.00 cm LV SV:         77 LV SV Index:   37 LVOT Area:     3.14 cm  RIGHT VENTRICLE RV S prime:     12.60 cm/s TAPSE (M-mode): 2.8 cm LEFT ATRIUM              Index        RIGHT ATRIUM           Index LA Vol (A2C):   125.0 ml 59.52 ml/m  RA Area:     16.40 cm LA Vol (A4C):   82.0 ml  39.04 ml/m  RA Volume:   41.10 ml  19.57 ml/m LA Biplane Vol: 102.0 ml 48.57 ml/m  AORTIC VALVE AV Area (Vmax):    2.34 cm AV Area (Vmean):   2.51 cm AV Area (VTI):     2.57 cm AV Vmax:           149.00 cm/s AV Vmean:          108.000 cm/s AV VTI:            0.299 m AV Peak Grad:      8.9 mmHg AV Mean Grad:      5.0 mmHg LVOT Vmax:         111.00 cm/s LVOT Vmean:        86.400 cm/s LVOT VTI:          0.245 m LVOT/AV VTI ratio: 0.82  AORTA Ao Root diam: 3.40 cm MITRAL VALVE MV Area (PHT): 4.00 cm     SHUNTS MV E velocity: 136.00 cm/s  Systemic VTI:  0.24 m MV A velocity: 50.10 cm/s   Systemic Diam: 2.00 cm MV E/A ratio:  2.71 Epifanio Lesches MD Electronically signed by Epifanio Lesches MD Signature Date/Time: 05/17/2023/5:49:09 PM    Final    DG  Chest Port 1 View Result Date: 05/16/2023 CLINICAL DATA:  Atrial fibrillation EXAM: PORTABLE CHEST 1 VIEW COMPARISON:  None Available. FINDINGS: Lungs are clear. No pneumothorax or pleural effusion. Cardiac size is mildly enlarged. Pulmonary vascularity is normal. No acute bone abnormality. IMPRESSION: 1. Mild cardiomegaly. Electronically Signed   By: Helyn Numbers M.D.   On: 05/16/2023 23:06    Cardiac Studies   2d echo 05/17/23   1. Left ventricular ejection fraction, by estimation, is 45 to 50%. The  left ventricle has mildly decreased function. The left ventricle  demonstrates global hypokinesis. There is mild left ventricular  hypertrophy. Left ventricular diastolic parameters  are consistent with Grade III diastolic dysfunction (restrictive).  Elevated left atrial pressure.  2. Right ventricular systolic function is normal. The right ventricular  size is mildly enlarged. Tricuspid regurgitation signal is inadequate for  assessing PA pressure.   3. Left atrial size was moderately dilated.   4. The mitral valve is normal in structure. Trivial mitral valve  regurgitation.   5. The aortic valve is tricuspid. Aortic valve regurgitation is trivial.  No aortic stenosis is present.   6. The inferior vena cava is dilated in size with <50% respiratory  variability, suggesting right atrial pressure of 15 mmHg.    Patient Profile     48 y.o. male with ESRD, HTN, DM, gastroparesis, NICM, former tobacco use, anemia. He was previously following at [facility] for possible transplant workup. 2D echo showed EF 50-55% in 10/2022, underwent cath (outlinedbelow) in 10/2022 with without significant CAD. He was recently admitted to our system 12/2022 with fever, cough, nausea, vomiting. Found to have RML pneumonia and newly diagnosed decreased LVEF, possibly stress induced versus related to hypertension. 2d Echo 01/06/23 showed EF 45-50% with global HK, G2DD, mildly enlarged RV, trivial MR, mildly dilated PA,  dilated IVC. Repeat echo 02/2023 showed possible cardiac amyloidosis with further decline to EF 35-40%. PYP scan was negative for aTTR amyloid. Did not previously tolerate carvedilol due to fatigue; Entresto previously OK'd by nephro. He has missed 1 or 2 dialysis sessions recently likely contributing to mildly elevated BP. Returned this admission with elevated HR during HD session, found to have AF RVR. Converted to NSR on diltiazem drip.  Assessment & Plan    1. New onset AF RVR - spontaneously converted to NSR on diltiazem drip, subsequently discontinued - CHADSVASC 3-> started on Eliquis 5mg  BID - TFTS abnormal with elevated TSH, decreased FT4-> per primary team - Toprol currently at 25mg  daily, recommended to increase to 50mg  daily if OK with nephrology, will defer to primary team for review - 2 week Zio recommended by Dr. Mayford Knife; Yvonna Alanis arranged this yesterday to be mailed to patient's house - patient aware  2. Acute on chronic HFrEF, cardiomyopathy - reported to have had concern for amyloid by 02/2023 echo; PYP negative for aTTR cardiomyopathy, will review if additional w/u needed with MD - SPEP, UPEP ordered 02/2023 but not yet completed - I recommended patient go to [facility] after discharge to obtain these labs so that they return to ordering provider - currently on Toprol and Entresto, continue  - volume mgmt per HD - repeat echo this admission with EF 45-50%, G3DD, mildly enlarged RV, trivial AI/MR  3. ESRD on HD - per nephrology  4. HTN  -manage in context above  Has f/u 07/2023 with Dr. Izora Ribas, will keep. Also arranged 4 week post-hospital/post-monitor f/u with Jari Favre PA-C.  For questions or updates, please contact [facility] Please consult [email or web address] for contact info under Cardiology/STEMI.  Signed, Laurann Montana, PA-C 05/18/2023, 9:25 AM

## 2023-05-18 NOTE — Progress Notes (Addendum)
Pt receives home HD care through [facility]. Will assist as needed.   Olivia Canter Renal Navigator [phone]  Addendum at 1:21 pm: Contacted by renal PA with pt's request for in-center HD for a few treatments to see how pt responds to being on eliquis and using heparin with home HD treatments. Pt does solo home HD and pt wants to make sure he knows how body will respond to new med. Pt states he normally receives in-center HD at [facility] whenever in-center is needed. Contacted [facility] Production designer, theatre/television/film and advised clinic does have an appt available if pt agreeable. Spoke to pt via phone who is agreeable to available appt. [facility] aware pt agreeable to appt. Contacted pt's home therapy dept at [facility]. Message left requesting a return call. Pt's home therapy clinic will need to clip pt to [facility]. Awaiting a return call from home therapy dept. Update provided to attending, nephrologist, and renal PA regarding above process.   Addendum at 4:15 pm: Pt has been accepted at [facility] as a transient. Pt's appt will be TTS 11:50 am chair time. Pt can start on Saturday and will need to arrive at 11:00 am to complete paperwork prior to treatment. Spoke to pt via phone to make aware of above arrangements. Pt agreeable to plan. Update provided to attending, nephrologist, and renal PA. Arrangements added to AVS as well. Renal PA aware of clinic's need for orders at d/c. Clinic aware pt should start on Saturday.

## 2023-05-18 NOTE — Discharge Summary (Signed)
Physician Discharge Summary   Patient: [name] MRN: [id number] DOB: Sep 06, 1975  Admit date:     05/16/2023  Discharge date: 05/18/23  Discharge Physician: Marguerita Merles, DO   PCP: Starla Link, MD   Recommendations at discharge:   Follow up PCP within 1 to 2 weeks and repeat CBC, CMP, mag, Phos within 1 week Follow-up with cardiology in outpatient setting within 1 to 2 weeks Follow-up with nephrology in outpatient setting and patient is on home hemodialysis but is requesting a few treatments in SNF for his new diagnosis with new anticoagulation meds and he has been arranged for follow-up dialysis at [facility] is [facility] as a transient HD chair at 11:50 AM Tuesday Thursday Saturday Repeat thyroid function studies within 4 to 6 weeks  Discharge Diagnoses: Principal Problem:   Atrial fibrillation with RVR (HCC) Active Problems:   ESRD (end stage renal disease) (HCC)   Hypertension   Type 2 diabetes mellitus without complication, without long-term current use of insulin (HCC)   Dilated cardiomyopathy (HCC)   Hypothyroidism  Resolved Problems:   * No resolved hospital problems. [facility] Course: The patient is a 48 year old obese African-American male with past medical history significant for biometry hypertension, ESRD on home dialysis, heart failure with reduced ejection fraction with EF of 30 to 40% and a history of A-fib once approximately 20 years ago presented the ED after he noticed his heart rate is high on a vital sign check at home.  He is taking bottles prior to his HD at home and denies shortness of breath or chest pain and states that if he had not taken vitals he would not have discovered that he is in A-fib.  Heart rate at home was in the 170s which prompted him to come to the ED.  And route he is given 20 mg of IV Cardizem and started on a Cardizem drip.  Of note he has not had dialysis this week and missed yesterday and is going to go a make-up session today.  In the  ED his rate was controlled and subsequently converted back to normal sinus rhythm 48 year old obese African-American male with past medical history significant for biometry hypertension, ESRD on home dialysis, heart failure with reduced ejection fraction with EF of 30 to 40% and a history of A-fib once approximately 20 years ago presented the ED after he noticed his heart rate is high on a vital sign check at home.  He is taking bottles prior to his HD at home and denies shortness of breath or chest pain and states that if he had not taken vitals he would not have discovered that he is in A-fib.  Heart rate at home was in the 170s which prompted him to come to the ED.  And route he is given 20 mg of IV Cardizem and started on a Cardizem drip.  Of note he has not had dialysis this week and missed yesterday and is going to go a make-up session today.  In the  ED his rate was controlled and subsequently converted back to normal sinus rhythm around 3 AM.  Nephrology and cardiology has been consulted for further evaluation and plans for dialysis tonight.  Cardiology evaluating and making some medication changes and he is stable for discharge and improved and cleared by the nephrology team and the cardiology team.  Assessment and Plan:  New Onset Atrial fibrillation with RVR (HCC) -Has New onset a.fib RVR -Was Initiated on Cardizem gtt for rate control and held paused at the moment since pt just converted out of a.fib and back into Sinus and will not continue given low EF -C/w Telemetry Monitoring  -C/w Metoprolol Succinate 25 mg po Daily -Stopping Amlodipine (thinking he's going to wind up on either cardizem or coreg instead) -CHA2DS2-VASc = 3, does have reduced EF.   -Heparin sq DVT ppx now changed to Apixaban 5 mg po BID for A fib -Ordering 2d echo to see if EF changed in past 2 months and repeat showed an EF of 45 to 50%.  And also showed "The left ventricle  demonstrates global hypokinesis. There is mild left ventricular hypertrophy. Left ventricular diastolic parameters are consistent with Grade III diastolic dysfunction (restrictive). Elevated left atrial pressure." -Cardiology consulted for further evaluation and recommendations and they are considering cardiac monitoring to assess A-fib burden in outpatient setting -Patient is getting a Zio patch sent to his house and outpatient follow-up with cardiology   ESRD (End  Stage Renal Disease) (HCC) Metabolic Acidosis -He missed dialysis yesterday due to new onset A.fib RVR -[place] acidosis with a CO2 21, anion gap of 14, chloride level 104 -BUN/Cr Trend: Recent Labs  Lab 05/17/23 0007 05/17/23 0023 05/17/23 0340 05/18/23 0818  BUN 59* 53* 60* 38*  CREATININE 10.83* 12.00* 11.42* 8.97*  -Will need to discontinue avoid taking ibuprofen and recommending calcitriol instead of Tums; Aldactone has  been held -C/w Ferric Citrate 420 mg po Daily  -Avoid Nephrotoxic Medications, Contrast Dyes, Hypotension and Dehydration to Ensure Adequate Renal Perfusion and will need to Renally Adjust Meds -Continue to Monitor and Trend Renal Function carefully and repeat CMP in the AM    Hypothyroidism -Pt with hypothyroidism on lab work today as TSH was 18.785 and free T4 was 0.55 -Starting low dose Levothyroxine 25 Mcg P.O. Daily -Will need repeat labs in 4-6 weeks.   Nonischemic Dilated cardiomyopathy with reduced EF -HFrEF with EF 35-40% as of most recent echo. -Cont Metoprolol Succinate 25 mg po Daily and Sacubitril-Valsartan 49-51 1 tab po BID -Hold Spironolactone for now -Cardiology consulted for further evaluation and management and GDMT is limited due to his renal disease -Hold with cardiology in outpatient setting  Hyponatremia -Na+ Trend: Recent Labs  Lab 05/17/23 0007 05/17/23 0023 05/17/23 0340 05/18/23 0818  NA 137 137 139 133*  -Continue to Monitora nd Trend and repeat CMP in the outpateint setting   Type 2 Diabetes Mellitus without complication, without long-term current use of insulin (HCC) -Previous A1C of 5.2, doesn't look like he's on any meds for this. -Continue to Monitor CBGs per protocol -CBG Trend:  Recent Labs  Lab 05/17/23 2313 05/18/23 1231 05/18/23 1624  GLUCAP 93 129* 107*   Hypertension -Continue Metoprolol Succinate 25 mg po Daily and Sacubitril-Valsartan 49-51 1 tab po BID -Currently holding amlodipine - either replace with cardizem or more likely a BB given HFrEF (will ask cards to weigh in on this since they follow him for BP). -Continue to Hold aldactone - Dialysis pt on a K sparing diuretic? Might want to double check with nephrology first. -Continue to Monitor BP per Protocol -Last BP reading was 146/85 -Follow-up with cardiology in outpatient setting  Pancytopenia -CBC Trend: Recent Labs  Lab 05/17/23 0007 05/17/23 0023 05/17/23 0340  05/18/23 0727  WBC 4.2  --  3.9* 2.7*  HGB 10.3*   < > 9.9* 11.7*  HCT 32.4*   < > 31.3* 35.9*  MCV 91.3  --  91.8 90.0  PLT 144*  --  150 122*   < > = values in this interval not displayed.  -Follow closely in the outpatient setting  Normocytic Anemia/Anemia of Chronic Kidney Disease -Hgb/Hct Trend: Recent Labs  Lab 05/17/23 0007 05/17/23 0023 05/17/23 0340 05/18/23 0727  HGB 10.3* 11.6* 9.9* 11.7*  HCT 32.4* 34.0* 31.3* 35.9*  MCV 91.3  --  91.8 90.0  -Check Anemia Panel within 1 week -Continue to Monitor for S/Sx of Bleeding; No overt bleeding noted -Repeat CBC within 1 week  Thrombocytopenia -Platelet Count Trend: Recent Labs  Lab 05/17/23 0007 05/17/23 0340 05/18/23 0727  PLT 144* 150 122*  -Continue to Monitor for S/Sx of Bleeding; No overt bleeding noted -Repeat CBC in 1 week  GERD/GI Prophylaxis -C/w PPI with Pantoprazole 40 mg po BID  Class I Obesity -Complicates overall prognosis and care -Estimated body mass index is 29.79 kg/m as calculated from the following:   Height as of this encounter: 5\' 9"  (1.753 m).  Weight as of this encounter: 91.5 kg.  -Weight Loss and Dietary Counseling given  Consultants: Nephrology, Cardiology Procedures performed: As delineated as above  Disposition: Home Diet recommendation:  Discharge Diet Orders (From admission, onward)     Start     Ordered   05/18/23 0000  Diet - low sodium heart healthy        05/18/23 1628           Renal diet DISCHARGE MEDICATION: Allergies as of 05/18/2023       Reactions   Ms Contin [morphine] Anaphylaxis, Swelling   Full body swelling   Sulfa Antibiotics Swelling   Facial and eye swelling        Medication List     STOP taking these medications    calcitRIOL 0.5 MCG capsule Commonly known as: ROCALTROL   calcium carbonate 500 MG chewable tablet Commonly known as: TUMS - dosed in mg elemental calcium   ibuprofen 200 MG tablet Commonly known as: ADVIL        TAKE these medications    acetaminophen 325 MG tablet Commonly known as: TYLENOL Take 2 tablets (650 mg total) by mouth every 6 (six) hours as needed for mild pain (pain score 1-3) (or Fever >/= 101).   amLODipine 10 MG tablet Commonly known as: NORVASC Take 10 mg by mouth daily.   apixaban 5 MG Tabs tablet Commonly known as: ELIQUIS Take 1 tablet (5 mg total) by mouth 2 (two) times daily.   doxazosin 2 MG tablet Commonly known as: CARDURA Take 2 mg by mouth at bedtime.   Entresto 49-51 MG Generic drug: sacubitril-valsartan Take 1 tablet by mouth 2 (two) times daily.   ferric citrate 1 GM 210 MG(Fe) tablet Commonly known as: AURYXIA Take 420 mg by mouth daily before breakfast.   levothyroxine 25 MCG tablet Commonly known as: SYNTHROID Take 1 tablet (25 mcg total) by mouth daily at 6 (six) AM.   metoCLOPramide 5 MG tablet Commonly known as: REGLAN Take 1 tablet (5 mg total) by mouth every 6 (six) hours as needed for nausea or vomiting.   metoprolol succinate 50 MG 24 hr tablet Commonly known as: TOPROL-XL Take 1 tablet (50 mg total) by mouth daily. Take with or immediately following a meal. What changed:  medication strength how much to take additional instructions   pantoprazole 40 MG tablet Commonly known as: PROTONIX Take 1 tablet (40 mg total) by mouth 2 (two) times daily before a meal.   spironolactone 25 MG tablet Commonly known as: ALDACTONE Take 25 mg by mouth daily.   VITAMIN B-12 PO Take 1 tablet by mouth daily.        Follow-up Information     Sharlene Dory, PA-C Follow up.   Specialty: Cardiology Why: [facility] location - cardiology follow-up arranged with PA Tessa on Monday Jun 19, 2023 10:55 AM (Arrive by 10:40 AM). You also have an appointment with Dr. Izora Ribas in April which we will keep for now.  The office will be mailing you a heart monitor to wear for 2 weeks. It will come with instructions for how to use and  mail back. Please call the office if you do not receive this within 3-4 days of discharge. Contact information: [address] [place] [address] [phone]         Ginette Otto, [facility] Go on 05/20/2023.   Why: Schedule is Tuesday, Thursday, Saturday with 11:50 am start  time.  On Saturday, please arrive at 11:00 am to complete paperwork prior to treatment. Contact information: [address] [place] [address] [phone]                Discharge Exam: Ceasar Mons Weights   05/16/23 2230 05/17/23 2300  Weight: 94.3 kg 91.5 kg   Vitals:   05/18/23 1212 05/18/23 1629  BP: (!) 147/91   Pulse: 78   Resp: 18 20  Temp: 98 F (36.7 C) 98 F (36.7 C)  SpO2: 100%    Examination: Physical Exam:  Constitutional: WN/WD overweight African-American male in no acute distress Respiratory: Diminished to auscultation bilaterally, no wheezing, rales, rhonchi or crackles. Normal respiratory effort and patient is not tachypenic. No accessory muscle use.  Breathing Cardiovascular: RRR, no murmurs / rubs / gallops. S1 and S2 auscultated. No extremity edema. Abdomen: Soft, non-tender, the secondary to body habitus. Bowel sounds positive.  GU: Deferred. Musculoskeletal: No clubbing / cyanosis of digits/nails. No joint deformity upper and lower extremities.  Has a right forearm AV fistula Skin: No rashes, lesions, ulcers on a limited skin evaluation. No induration; Warm and dry.  Neurologic: CN 2-12 grossly intact with no focal deficits.  Romberg sign and cerebellar reflexes not assessed.  Psychiatric: Normal judgment and insight. Alert and oriented x 3. Normal mood and appropriate affect.   Condition at discharge: stable  The results of significant diagnostics from this hospitalization (including imaging, microbiology, ancillary and laboratory) are listed below for reference.   Imaging Studies: ECHOCARDIOGRAM COMPLETE Result Date: 05/17/2023     ECHOCARDIOGRAM REPORT   Patient Name:   [name] [facility] Date of Exam: 05/17/2023 Medical Rec #:  [id number]        Height:       69.0 in Accession #:    [id number]       Weight:       207.9 lb Date of Birth:  October 27, 1975        BSA:          2.100 m Patient Age:    47 years         BP:           148/84 mmHg Patient Gender: M                HR:           78 bpm. Exam Location:  Inpatient Procedure: 2D Echo, Cardiac Doppler and Color Doppler Indications:    Atrial Fibrillation I48.91  History:        Patient has prior history of Echocardiogram examinations, most                 recent 01/06/2023. Risk Factors:Hypertension.  Sonographer:    Darlys Gales Referring Phys: [phone] JARED M GARDNER IMPRESSIONS  1. Left ventricular ejection fraction, by estimation, is 45 to 50%. The left ventricle has mildly decreased function. The left ventricle demonstrates global hypokinesis. There is mild left ventricular hypertrophy. Left ventricular diastolic parameters are consistent with Grade III diastolic dysfunction (restrictive). Elevated left atrial pressure.  2. Right ventricular systolic function is normal. The right ventricular size is mildly enlarged. Tricuspid regurgitation signal is inadequate for assessing PA pressure.  3. Left atrial size was moderately dilated.  4. The mitral valve is normal in structure. Trivial mitral valve regurgitation.  5. The aortic valve is tricuspid. Aortic valve regurgitation is trivial. No aortic stenosis is present.  6. The inferior vena cava is dilated in size with <50%  respiratory variability, suggesting right atrial pressure of 15 mmHg. FINDINGS  Left Ventricle: Left ventricular ejection fraction, by estimation, is 45 to 50%. The left ventricle has mildly decreased function. The left ventricle demonstrates global hypokinesis. The left ventricular internal cavity size was normal in size. There is  mild left ventricular hypertrophy. Left ventricular diastolic parameters are consistent with Grade  III diastolic dysfunction (restrictive). Elevated left atrial pressure. Right Ventricle: The right ventricular size is mildly enlarged. Right vetricular wall thickness was not well visualized. Right ventricular systolic function is normal. Tricuspid regurgitation signal is inadequate for assessing PA pressure. Left Atrium: Left atrial size was moderately dilated. Right Atrium: Right atrial size was normal in size. Pericardium: There is no evidence of pericardial effusion. Mitral Valve: The mitral valve is normal in structure. Trivial mitral valve regurgitation. Tricuspid Valve: The tricuspid valve is normal in structure. Tricuspid valve regurgitation is trivial. Aortic Valve: The aortic valve is tricuspid. Aortic valve regurgitation is trivial. No aortic stenosis is present. Aortic valve mean gradient measures 5.0 mmHg. Aortic valve peak gradient measures 8.9 mmHg. Aortic valve area, by VTI measures 2.57 cm. Pulmonic Valve: The pulmonic valve was normal in structure. Pulmonic valve regurgitation is trivial. Aorta: The aortic root is normal in size and structure. Venous: The inferior vena cava is dilated in size with less than 50% respiratory variability, suggesting right atrial pressure of 15 mmHg. IAS/Shunts: The interatrial septum was not well visualized.  LEFT VENTRICLE PLAX 2D LVIDd:         5.10 cm   Diastology LVIDs:         3.70 cm   LV e' medial:   6.20 cm/s LV PW:         1.50 cm   LV E/e' medial: 21.9 LV IVS:        1.10 cm LVOT diam:     2.00 cm LV SV:         77 LV SV Index:   37 LVOT Area:     3.14 cm  RIGHT VENTRICLE RV S prime:     12.60 cm/s TAPSE (M-mode): 2.8 cm LEFT ATRIUM              Index        RIGHT ATRIUM           Index LA Vol (A2C):   125.0 ml 59.52 ml/m  RA Area:     16.40 cm LA Vol (A4C):   82.0 ml  39.04 ml/m  RA Volume:   41.10 ml  19.57 ml/m LA Biplane Vol: 102.0 ml 48.57 ml/m  AORTIC VALVE AV Area (Vmax):    2.34 cm AV Area (Vmean):   2.51 cm AV Area (VTI):     2.57 cm AV  Vmax:           149.00 cm/s AV Vmean:          108.000 cm/s AV VTI:            0.299 m AV Peak Grad:      8.9 mmHg AV Mean Grad:      5.0 mmHg LVOT Vmax:         111.00 cm/s LVOT Vmean:        86.400 cm/s LVOT VTI:          0.245 m LVOT/AV VTI ratio: 0.82  AORTA Ao Root diam: 3.40 cm MITRAL VALVE MV Area (PHT): 4.00 cm     SHUNTS MV E velocity: 136.00 cm/s  Systemic VTI:  0.24 m MV A velocity: 50.10 cm/s   Systemic Diam: 2.00 cm MV E/A ratio:  2.71 Epifanio Lesches MD Electronically signed by Epifanio Lesches MD Signature Date/Time: 05/17/2023/5:49:09 PM    Final    DG Chest Port 1 View Result Date: 05/16/2023 CLINICAL DATA:  Atrial fibrillation EXAM: PORTABLE CHEST 1 VIEW COMPARISON:  None Available. FINDINGS: Lungs are clear. No pneumothorax or pleural effusion. Cardiac size is mildly enlarged. Pulmonary vascularity is normal. No acute bone abnormality. IMPRESSION: 1. Mild cardiomegaly. Electronically Signed   By: Helyn Numbers M.D.   On: 05/16/2023 23:06   Microbiology: Results for orders placed or performed during the hospital encounter of 05/16/23  MRSA Next Gen by PCR, Nasal     Status: None   Collection Time: 05/18/23  1:00 AM   Specimen: Nasal Mucosa; Nasal Swab  Result Value Ref Range Status   MRSA by PCR Next Gen NOT DETECTED NOT DETECTED Final    Comment: (NOTE) The GeneXpert MRSA Assay (FDA approved for NASAL specimens only), is one component of a comprehensive MRSA colonization surveillance program. It is not intended to diagnose MRSA infection nor to guide or monitor treatment for MRSA infections. Test performance is not FDA approved in patients less than 7 years old. Performed at [facility], [address] [place], [place] [address]    Labs: CBC: Recent Labs  Lab 05/17/23 0007 05/17/23 0023 05/17/23 0340 05/18/23 0727  WBC 4.2  --  3.9* 2.7*  NEUTROABS 2.6  --   --  1.1*  HGB 10.3* 11.6* 9.9* 11.7*  HCT 32.4* 34.0* 31.3* 35.9*  MCV 91.3  --  91.8  90.0  PLT 144*  --  150 122*   Basic Metabolic Panel: Recent Labs  Lab 05/17/23 0007 05/17/23 0023 05/17/23 0340 05/18/23 0818  NA 137 137 139 133*  K 4.7 4.7 4.7 4.4  CL 103 105 104 99  CO2 21*  --  21* 25  GLUCOSE 150* 145* 158* 102*  BUN 59* 53* 60* 38*  CREATININE 10.83* 12.00* 11.42* 8.97*  CALCIUM 8.3*  --  8.5* 8.2*  MG 2.6*  --   --  2.3  PHOS  --   --   --  4.7*   Liver Function Tests: Recent Labs  Lab 05/17/23 0007 05/18/23 0818  AST 20 23  ALT 28 28  ALKPHOS 52 45  BILITOT 0.5 0.8  PROT 6.6 6.1*  ALBUMIN 3.7 3.2*   CBG: Recent Labs  Lab 05/17/23 2313 05/18/23 1231 05/18/23 1624  GLUCAP 93 129* 107*   Discharge time spent: greater than 30 minutes.  Signed: Marguerita Merles, DO [facility] 05/21/2023

## 2023-05-19 NOTE — Progress Notes (Signed)
Late Note Entry- May 19, 2023  Contacted [facility] this morning to be advised that pt was d/c yesterday. Clinic advised pt should start tomorrow as planned. Renal PA sent orders to clinic.   Olivia Canter Renal Navigator [phone]

## 2023-05-22 DIAGNOSIS — I480 Paroxysmal atrial fibrillation: Secondary | ICD-10-CM

## 2023-05-22 NOTE — Progress Notes (Signed)
Late Note Entry- May 22, 2023  D/C summary and last renal note faxed to [facility] therapy dept this morning for continuation of care.   Olivia Canter Renal Navigator [phone]

## 2023-06-16 NOTE — Progress Notes (Deleted)
**Note [name] via Obfuscation**  Cardiology Office Note    Date:  06/16/2023  ID:  OMID DEARDORFF, DOB 06-16-1975, MRN [id number] PCP:  Starla Link, MD  Cardiologist:  Christell Constant, MD  Electrophysiologist:  None    History of Present Illness: [name]    DEVAL MROCZKA is a 48 y.o. male with visit-pertinent history of ESRD, HTN, DM, gastroparesis, mild cardiomyopathy seen for follow-up.  He was previously following at [facility] for possible transplant workup. 2D echo showed EF 50-55% in 10/2022, underwent cath (outlinedbelow) in 10/2022 with without significant CAD. He was recently admitted to our hospital with fever, cough, nausea, vomiting. Found to have RML pneumonia and newly diagnosed decreased LVEF, possibly stress induced versus related to hypertension. 2d Echo 01/06/23 showed EF 45-50% with global HK, G2DD, mildly enlarged RV, trivial MR, mildly dilated PA, dilated IVC. He did not previously tolerate carvedilol due to fatigue but was doing well back on it in the hospital. He was also started on Entresto, OK'd by nephro. TSH was abnormal with normal FT4.  ? Transplant status with decline in EF, plan for repeat echo  Mild cardiomyopathy Essential HTN ESRD on HD Abnormal thyroid function test (TSH)  He was seen by me 10/2, he felt nausea this morning and his head was hurting and dizziness when he laid flat. Some vomiting this morning but just clear liquids. He took two tylenol for pain and that did not come up. This was the first time he was sick since the hospital. He had dialysis yesterday and he thinks this might have something to do with this.  He took his medications this morning about 1 hour ago. He does his dialysis at home. He has BP cuffs at home to check his pressure. I encouraged him to keep track at home and call if diastolic continues to be elevated.  Echocardiogram ordered 11/4 showing possible amyloidosis with EF 35 to 40%.  Restrictive diastolic dysfunction.  PYP scan, SPEP, and UPEP ordered.  I  saw the patient on 05/10/2023, he presents with a history of hypertension and on home dialysis, with ongoing issues of high blood pressure. He reports that his blood pressure readings at home have been consistently high, mirroring the readings taken during the clinic visit. The patient attributes his high blood pressure to a combination of factors, including stress and financial constraints that affect his ability to consistently take his prescribed medications.  The patient's stress levels are reportedly high due to personal circumstances, including a challenging home environment and financial difficulties. He expresses a desire [place]. The patient also mentions that he finds relief from stress through activities such as shooting guns and playing golf, which he is unable to do in his current city residence.  The patient's financial constraints are a significant barrier to medication compliance. He reports struggling to afford his medications, particularly his Entresto prescription, which is a key part of his hypertension management. The patient is open to adjusting his medication regimen to make it more affordable and manageable.  Reports no shortness of breath nor dyspnea on exertion. Reports no chest pain, pressure, or tightness. No edema, orthopnea, PND. Reports no palpitations.   Discussed the use of AI scribe software for clinical note transcription with the patient, who gave verbal consent to proceed.   Today, he ***  Labwork independently reviewed: 12/2022 Hgb 10.9, plt 191, K 4.0, Cr 10.47, LDL 46, trig 116, TSH 16 with  normal FT4, alb 3.2, AST 12, ALT OK  ROS: .    Please see the history of present illness.  All other systems are reviewed and otherwise negative.  Studies Reviewed: [name]    EKG:  Not ordered today  CV Studies: Cardiac studies reviewed are outlined and summarized above. Otherwise please see  EMR for full report.  2d echo 12/2022   1. Left ventricular ejection fraction, by estimation, is 40 to 45%. The  left ventricle has mildly decreased function. The left ventricle  demonstrates global hypokinesis. There is moderate left ventricular  hypertrophy. Left ventricular diastolic  parameters are consistent with Grade II diastolic dysfunction  (pseudonormalization).   2. Right ventricular systolic function is normal. The right ventricular  size is mildly enlarged. Tricuspid regurgitation signal is inadequate for  assessing PA pressure.   3. Left atrial size was mildly dilated.   4. The mitral valve is normal in structure. Trivial mitral valve  regurgitation. No evidence of mitral stenosis.   5. The aortic valve is tricuspid. Aortic valve regurgitation is not  visualized. No aortic stenosis is present.   6. Mildly dilated pulmonary artery.   7. The inferior vena cava is dilated in size with <50% respiratory  variability, suggesting right atrial pressure of 15 mmHg.    CareEverywhere 10/2022  Procedure: A two-dimensional transthoracic echocardiogram with color flow and Doppler was performed. Study Quality: Fair.  Left Ventricle: The left ventricle is normal in size. There is mild concentric left ventricular hypertrophy. Ejection Fraction = 50-55%. The left ventricular wall motion is normal. Diastolic dysfunction, Grade III (restrictive pattern), consistent with markedly increased left atrial pressure.  Left Atrium: The left atrium is mildly dilated. Left atrial index is 62ml/m^2.  Right Atrium: Right atrial size is normal. 18cm2  Right Ventricle: The right ventricle is normal size. The right ventricular systolic function is normal.  Aortic Valve: The aortic valve is trileaflet. The aortic valve opens well. Trace (trivial) aortic regurgitation.  Mitral Valve: The mitral valve leaflets appear normal. There is no evidence of stenosis, fluttering, or  prolapse. There is mild mitral regurgitation.  Tricuspid Valve: Structurally normal tricuspid valve. There is mild tricuspid regurgitation. Estimated right atrial pressure is 5 mmHg.[name] RVSP not able to be calculated.  Pulmonic Valve: Structurally normal pulmonic valve. Mild pulmonic valvular regurgitation.  Arteries: The aortic root is normal size. Borderline dilated ascending aorta.  Venous: Mildly dilated inferior vena cava.  Pericardium/Pleura: Trivial pericardial effusion.   Cath 10/2022  INTERVENTIONAL CARDIOLOGIST: Jennette Bill, MD GENERAL CARDIOLOGY FELLOW: Threasa Alpha, MD INTERVENTIONAL FELLOW: None DATE OF PROCEDURE: 11/17/22  PROCEDURES: Ultrasound-guided Right Femoral artery access Ultrasound-guided Right Femoral vein access Selective Left and right coronary system angiography Right heart catheterization Conscious sedation monitoring time: 75 minutes  INDICATIONS: Pre-operative evaluation  HISTORY: Andreas Sobolewski is a 48yrs old Male with past medical history of HTN, DM-2, ESRD on home dialysis was seen in the cardiology clinic for a pre operative cardiac risk stratification ahead of being listed for a renal transplant . He was brought to the cardiac catheterization lab for right heart catheterization and coronary angiography and possible revascularization.  CONSENT: After the risk and benefits were explained to the patient, he was able to reiterate the risk and benefits along with the details of the procedure and consented to the procedure.  CATHETERIZATION LABORATORY STATISTICS: Total contrast used for this case was 50 cc of Visipaque contrast. Total fluoroscopic time for this case was 13.8 minutes. Total dose area product was  2850 cGycm2 Cumulative Air Kerma was 301 mGy Blood Loss: Less than 10mL  DESCRIPTION OF PROCEDURE: The patient was brought to the Cardiac Catheterization Laboratory in stable fasting condition. After informed consent was  obtained and a WHO time out was performed with the entire cath lab team present before commencing the procedure. Local anesthesia administered with 2% Lidocaine solution subcutaneously.The patient was provided with conscious sedation with a total of 1mg  intravenous Versed. Arterial access was achieved with a micro puncture under ultrasound guidance and a 6 Fr Ultrasound guided Right Femoral artery sheath was inserted. Venous access was achieved with a micro puncture under ultrasound guidance and a 7 Fr Ultrasound guided Right Femoral vein sheath was inserted. Right heart catheterization was performed with a 7 Fr Arrow thermodilution catheter A 6 Fr JL 4.0 catheter was used to engage the left coronary artery. A 6 Fr JR 4.0 catheter was used to engage the right coronary artery. The LV cavity was not entered. Multiple angiographic views were obtained to delineate the coronary anatomy. 0.035" 145cm J wire Guidewires were used during the procedure. Arterial hemostasis was achieved with Manual compression. Venous hemostasis was achieved with manual compression. All catheters and wires were removed at the end of the procedure.   HEMODYNAMIC DATA: Aortic blood pressure is 185/96 mmHg with a mean of 138 mmHg. Right atrial pressure mean is 8 mmHg. Right ventricular pressure is 62/10 mmHg Pulmonary arterial pressure is 56/22 mmHg with mean of 42 mmHg. Pulmonary wedge catheter pressure mean is 21 mmHg. V wave is 32 mmHg.  SATURATION DATA: 1. Hemoglobin is 10 g/dL. 2. Right atrial saturation is 75 %. 3. Pulmonary artery saturation is 78 %. 4. Aortic saturation is 93 %.  USING MODIFIED FICK METHOD: Cardiac Output is 13 L/min. Cardiac index is 6.3 L/min/m-2 SVR is 800 dsc-5 PVR is 1.6 Woods Units.  ANGIOGRAPHIC DATA:  Left main coronary artery: Large caliber vessel. Angiographically, no evidence of obstructive disease.  Left anterior descending coronary artery (LAD): Large caliber vessel, giving rise to  Septal perforators and a large diagonal. LAD wraps around the apex. Angiographically, no evidence of obstructive disease.  Left circumflex coronary artery (LCx): Large caliber vessel which gives off a large OM1 branch before continuing as AV grrove LCx. OM1 bifurcates into superior and inferior branches. There is no evidence of obstructive disease.  Right coronary artery (RCA): Large caliber vessel. Dominant. Distally bifurcates into PDA and PLV. Angiographically, no evidence of obstructive disease.  IMPRESSION: No evidence of epicardial CAD. Moderate post capillary pulmonary HTN.  RECOMMENDATIONS: Continue aggressive risk factor modifications for prevention of CAD. Management of pulmonary hypertension per primary cardiology team.  The results of the procedure were discussed with the patient.  Jennette Bill, MD was present and supervised during the entire procedure    Patient Profile     48 y.o. male with ESRD, HTN, DM, gastroparesis admitted with fever, cough, nausea, vomiting. Found to have RML pneumonia and newly diagnosed decreased LVEF. Med management complicated by nausea, vomiting, gastroparesis. TSH also abnormal recommended for OP repeat. Of note, patient was following at [facility] recently (had moved to [facility]) and had OV with cardiology there 10/2022, EF 50-55% by echo then, recommended for [facility] in preparation for renal transplant eval, do not see completed.    Current Reported Medications:.    No outpatient medications have been marked as taking for the 06/19/23 encounter (Appointment) with Sharlene Dory, PA-C.    Physical Exam:    VS:  There were  no vitals taken for this visit.   Wt Readings from Last 3 Encounters:  05/17/23 201 lb 11.2 oz (91.5 kg)  05/10/23 197 lb 12.8 oz (89.7 kg)  01/25/23 207 lb (93.9 kg)    GEN: Well nourished, well developed in no acute distress NECK: No JVD; No carotid bruits CARDIAC: RRR, no murmurs, rubs, gallops RESPIRATORY:  Clear to  auscultation without rales, wheezing or rhonchi  ABDOMEN: Soft, non-tender, non-distended EXTREMITIES:  No edema; No acute deformity   Asessement and Plan:[name]    Hypertension Elevated blood pressure readings at home and in clinic. Patient reports stress and financial difficulties as potential contributing factors. Currently on Metoprolol 25mg  daily and Entresto 49/51mg  twice daily, though he has not yet started the increased dose of Entresto due to financial constraints. -provided a copay card today for Entresto (10$ a month) -Change prescription to monthly refills to accommodate his financial situation. -Encourage him to discuss stressors with family and explore potential stress-relieving activities such as visiting a local gun range. -Provide him with information on a anti-hypertensive diet.  Renal Failure/HD dependent  He was declined for transplant status. Negative workup for amyloidosis. He is on home dialysis. -Ensure all results are sent to the transplant team for their records. -Continue current management plan.  Edema He reports mild swelling in the right leg, which started after a catheter procedure. No swelling at the catheter site. -Advise him to continue with current management (elevation of legs). -Continue to monitor.  Mild CM -Recent echocardiogram reviewed -No medication changes made today -Would encourage continuing metoprolol 25 mg tdaily, Entresto 49-51mg  twice a day     Disposition: F/u with Dr. Izora Ribas in 3-4 months  Signed, Sharlene Dory, PA-C

## 2023-06-19 ENCOUNTER — Ambulatory Visit: Payer: BC Managed Care – PPO | Admitting: Physician Assistant

## 2023-06-19 DIAGNOSIS — I10 Essential (primary) hypertension: Secondary | ICD-10-CM

## 2023-06-19 DIAGNOSIS — N186 End stage renal disease: Secondary | ICD-10-CM

## 2023-06-19 DIAGNOSIS — I420 Dilated cardiomyopathy: Secondary | ICD-10-CM

## 2023-06-19 DIAGNOSIS — R609 Edema, unspecified: Secondary | ICD-10-CM

## 2023-06-30 ENCOUNTER — Telehealth: Payer: Self-pay

## 2023-06-30 NOTE — Telephone Encounter (Signed)
-----   Message from Abagail Kitchens sent at 06/21/2023  8:14 AM EST ----- Can you please tell patient that heart monitor did not demonstrate any atrial fibrillation surprisingly, he had some short runs of some fast heartbeats but nothing to be concerned.  I see that he canceled his hospital follow-up visit and does not see primary MD until another 2 months.  Can you please offer him follow-up visit with APP?

## 2023-06-30 NOTE — Telephone Encounter (Signed)
Attempted to call pt. Voicemail is full.

## 2023-07-06 NOTE — Telephone Encounter (Signed)
 Attempted to call patient. Unable to leave voicemail.

## 2023-07-11 ENCOUNTER — Telehealth (HOSPITAL_BASED_OUTPATIENT_CLINIC_OR_DEPARTMENT_OTHER): Payer: Self-pay

## 2023-07-11 NOTE — Telephone Encounter (Addendum)
 2nd call attempt to patient, no answer, unable to leave VM ( no dpr on file).     ----- Message from Abagail Kitchens sent at 06/21/2023  8:14 AM EST ----- Can you please tell patient that heart monitor did not demonstrate any atrial fibrillation surprisingly, he had some short runs of some fast heartbeats but nothing to be concerned.  I see that he canceled his hospital follow-up visit and does not see primary MD until another 2 months.  Can you please offer him follow-up visit with APP?

## 2023-08-09 ENCOUNTER — Encounter: Payer: Self-pay | Admitting: *Deleted

## 2023-08-22 ENCOUNTER — Telehealth: Payer: Self-pay | Admitting: Radiology

## 2023-08-22 ENCOUNTER — Ambulatory Visit: Payer: BC Managed Care – PPO | Attending: Internal Medicine | Admitting: Internal Medicine

## 2023-08-22 ENCOUNTER — Encounter: Payer: Self-pay | Admitting: Internal Medicine

## 2023-08-22 VITALS — BP 183/103 | HR 83 | Ht 68.0 in | Wt 208.2 lb

## 2023-08-22 DIAGNOSIS — G4733 Obstructive sleep apnea (adult) (pediatric): Secondary | ICD-10-CM | POA: Diagnosis not present

## 2023-08-22 DIAGNOSIS — I5020 Unspecified systolic (congestive) heart failure: Secondary | ICD-10-CM

## 2023-08-22 DIAGNOSIS — N186 End stage renal disease: Secondary | ICD-10-CM

## 2023-08-22 MED ORDER — AMLODIPINE BESYLATE 10 MG PO TABS
10.0000 mg | ORAL_TABLET | Freq: Every day | ORAL | 3 refills | Status: DC
Start: 2023-08-22 — End: 2023-12-20

## 2023-08-22 MED ORDER — HYDRALAZINE HCL 25 MG PO TABS: 25.0000 mg | ORAL_TABLET | Freq: Three times a day (TID) | ORAL | 3 refills | Status: DC

## 2023-08-22 NOTE — Patient Instructions (Signed)
 Medication Instructions:  Your physician has recommended you make the following change in your medication:  START: hydralazine 25 mg by mouth 3 times daily  STOP: Entresto   REFILLED: amlodipine  (Norvasc )   *If you need a refill on your cardiac medications before your next appointment, please call your pharmacy*  Lab Work: NONE  If you have labs (blood work) drawn today and your tests are completely normal, you will receive your results only by: MyChart Message (if you have MyChart) OR A paper copy in the mail If you have any lab test that is abnormal or we need to change your treatment, we will call you to review the results.  Testing/Procedures: Your physician has requested that you have a sleep study.  Your physician has referred you to see a Nutritionist.   Follow-Up: At [facility], you and your health needs are our priority.  As part of our continuing mission to provide you with exceptional heart care, our providers are all part of one team.  This team includes your primary Cardiologist (physician) and Advanced Practice Providers or APPs (Physician Assistants and Nurse Practitioners) who all work together to provide you with the care you need, when you need it.  Your next appointment:   4 month(s)  Provider:   Lovette Rud, PA-C

## 2023-08-22 NOTE — Progress Notes (Signed)
 Cardiology Office Note:  .    Date:  08/22/2023  ID:  [name], DOB [birth year], MRN [id number] PCP: [name], MD  [name] Cardiologist:  [name], MD     CC: Second Opinion- HTN  History of Present Illness: [name]    [name] is a 48 y.o. male  with reduced ejection fraction and end stage renal disease who presents for a second opinion on his cardiovascular management. Established care with Dr. [name], seen by Dr. [name] after.  He has a history of heart failure with reduced ejection fraction and end stage renal disease. Recently, he moved to the area and was diagnosed with dilated nonischemic cardiomyopathy. He was previously evaluated for a potential transplant and experienced a decrease in ejection fraction without evidence of coronary artery disease. Missing a dialysis session led to worsening dyspnea and a secondary admission.  His blood pressure was elevated after consuming tacos with new seasoning and remains high. He is currently on amlodipine , which he needs to refill. He has experienced hypertension for a while and believes it may be genetic, although his family history is not strongly suggestive of hypertension.  He is on several medications, including amlodipine , metoprolol , and Eliquis . He reports intolerance to carvedilol  due to fatigue and frequent urination with spironolactone. He is unsure why he is on Eliquis  but recalls it being started during a hospital stay.  He has a history of hypertension from a young age and manages his condition with home dialysis. He is interested in natural remedies and maintains a diet high in vegetables, having recently reduced meat consumption.  No chest pain, shortness of breath, syncope, headaches, or vision loss. He reports night sweats and daytime somnolence. He has not been tested for sleep apnea.  Discussed the use of AI scribe software for clinical note transcription with the patient, who gave  verbal consent to proceed.   Relevant histories: .  Social - lives 6 minutes away from here, self HD ROS: As per HPI.   Studies Reviewed: .   Cardiac Studies & Procedures   ______________________________________________________________________________________________     ECHOCARDIOGRAM  ECHOCARDIOGRAM COMPLETE 05/17/2023  Narrative ECHOCARDIOGRAM REPORT    Patient Name:   [name] [facility] Date of Exam: 05/17/2023 Medical Rec #:  [id number]        Height:       69.0 in Accession #:    [id number]       Weight:       207.9 lb Date of Birth:  05-23-1975        BSA:          2.100 m Patient Age:    47 years         BP:           148/84 mmHg Patient Gender: M                HR:           78 bpm. Exam Location:  Inpatient  Procedure: 2D Echo, Cardiac Doppler and Color Doppler  Indications:    Atrial Fibrillation I48.91  History:        Patient has prior history of Echocardiogram examinations, most recent 01/06/2023. Risk Factors:Hypertension.  Sonographer:    Astrid Blamer Referring Phys: [phone] JARED M GARDNER  IMPRESSIONS   1. Left ventricular ejection fraction, by estimation, is 45 to 50%. The left ventricle has mildly decreased function. The left ventricle demonstrates global hypokinesis. There is mild left  ventricular hypertrophy. Left ventricular diastolic parameters are consistent with Grade III diastolic dysfunction (restrictive). Elevated left atrial pressure. 2. Right ventricular systolic function is normal. The right ventricular size is mildly enlarged. Tricuspid regurgitation signal is inadequate for assessing PA pressure. 3. Left atrial size was moderately dilated. 4. The mitral valve is normal in structure. Trivial mitral valve regurgitation. 5. The aortic valve is tricuspid. Aortic valve regurgitation is trivial. No aortic stenosis is present. 6. The inferior vena cava is dilated in size with <50% respiratory variability, suggesting right atrial pressure of 15  mmHg.  FINDINGS Left Ventricle: Left ventricular ejection fraction, by estimation, is 45 to 50%. The left ventricle has mildly decreased function. The left ventricle demonstrates global hypokinesis. The left ventricular internal cavity size was normal in size. There is mild left ventricular hypertrophy. Left ventricular diastolic parameters are consistent with Grade III diastolic dysfunction (restrictive). Elevated left atrial pressure.  Right Ventricle: The right ventricular size is mildly enlarged. Right vetricular wall thickness was not well visualized. Right ventricular systolic function is normal. Tricuspid regurgitation signal is inadequate for assessing PA pressure.  Left Atrium: Left atrial size was moderately dilated.  Right Atrium: Right atrial size was normal in size.  Pericardium: There is no evidence of pericardial effusion.  Mitral Valve: The mitral valve is normal in structure. Trivial mitral valve regurgitation.  Tricuspid Valve: The tricuspid valve is normal in structure. Tricuspid valve regurgitation is trivial.  Aortic Valve: The aortic valve is tricuspid. Aortic valve regurgitation is trivial. No aortic stenosis is present. Aortic valve mean gradient measures 5.0 mmHg. Aortic valve peak gradient measures 8.9 mmHg. Aortic valve area, by VTI measures 2.57 cm.  Pulmonic Valve: The pulmonic valve was normal in structure. Pulmonic valve regurgitation is trivial.  Aorta: The aortic root is normal in size and structure.  Venous: The inferior vena cava is dilated in size with less than 50% respiratory variability, suggesting right atrial pressure of 15 mmHg.  IAS/Shunts: The interatrial septum was not well visualized.   LEFT VENTRICLE PLAX 2D LVIDd:         5.10 cm   Diastology LVIDs:         3.70 cm   LV e' medial:   6.20 cm/s LV PW:         1.50 cm   LV E/e' medial: 21.9 LV IVS:        1.10 cm LVOT diam:     2.00 cm LV SV:         77 LV SV Index:   37 LVOT Area:      3.14 cm   RIGHT VENTRICLE RV S prime:     12.60 cm/s TAPSE (M-mode): 2.8 cm  LEFT ATRIUM              Index        RIGHT ATRIUM           Index LA Vol (A2C):   125.0 ml 59.52 ml/m  RA Area:     16.40 cm LA Vol (A4C):   82.0 ml  39.04 ml/m  RA Volume:   41.10 ml  19.57 ml/m LA Biplane Vol: 102.0 ml 48.57 ml/m AORTIC VALVE AV Area (Vmax):    2.34 cm AV Area (Vmean):   2.51 cm AV Area (VTI):     2.57 cm AV Vmax:           149.00 cm/s AV Vmean:          108.000 cm/s AV  VTI:            0.299 m AV Peak Grad:      8.9 mmHg AV Mean Grad:      5.0 mmHg LVOT Vmax:         111.00 cm/s LVOT Vmean:        86.400 cm/s LVOT VTI:          0.245 m LVOT/AV VTI ratio: 0.82  AORTA Ao Root diam: 3.40 cm  MITRAL VALVE MV Area (PHT): 4.00 cm     SHUNTS MV E velocity: 136.00 cm/s  Systemic VTI:  0.24 m MV A velocity: 50.10 cm/s   Systemic Diam: 2.00 cm MV E/A ratio:  2.71  Carson Clara MD Electronically signed by Carson Clara MD Signature Date/Time: 05/17/2023/5:49:09 PM    Final    MONITORS  LONG TERM MONITOR (3-14 DAYS) 06/12/2023  Narrative   Patient had a minimum heart rate of 65 bpm, maximum heart rate of 154 bpm, and average heart rate of 79 bpm. Predominant underlying rhythm was sinus rhythm. Short runs of SVT, 20 beats at longest. Isolated PACs were rare (<1.0%). Isolated PVCs were rare (<1.0%). No triggered and diary events.  Asymptomatic paroxysmal SVT.      PYP SCAN  MYOCARDIAL AMYLOID PLANAR AND SPECT 04/17/2023  Narrative   Myocardial uptake was negative for radiotracer uptake. The visual grade of myocardial uptake relative to the ribs was Grade 0 (No myocardial uptake and normal bone uptake).   Findings are not suggestive (Grade 0) of cardiac ATTR amyloidosis.   Prior study not available for comparison.  ______________________________________________________________________________________________       Physical Exam:    VS:   BP (!) 183/103 (BP Location: Left Arm)   Pulse 83   Ht 5\' 8"  (1.727 m)   Wt 94.4 kg   SpO2 99%   BMI 31.66 kg/m    Wt Readings from Last 3 Encounters:  08/22/23 94.4 kg  05/17/23 91.5 kg  05/10/23 89.7 kg    Gen: no distress  Neck: No JVD Cardiac: No Rubs or Gallops, S3 RRR +2 radial pulses Respiratory: Clear to auscultation bilaterally, normal effort, normal  respiratory rate GI: Soft, nontender, non-distended  MS: No  edema;  moves all extremities Integument: Skin feels warm, well bandaged access site with no bleeding; good bruit and thrill Neuro:  At time of evaluation, alert and oriented to person/place/time/situation  Psych: Normal affect, patient feels well   ASSESSMENT AND PLAN: .    Heart failure with reduced ejection fraction NYHA I Chronic heart failure with reduced ejection fraction, likely secondary to hypertension. Recent decrease in ejection fraction without evidence of coronary artery disease. Management complicated by end stage renal disease, limiting medication options. Hydralazine chosen due to limited options and inability to tolerate carvedilol  due to fatigue. - Refill amlodipine  prescription. - Start hydralazine 25 mg TID. - Discontinue Entresto  due to cost and questionable benefit in end stage renal disease.  Hypertension Chronic hypertension with recent episodes of elevated blood pressure, complicated by end stage renal disease. Current medications include amlodipine  and metoprolol . Hydralazine added to manage hypertension due to limited options. Managed by his nephrologist. - Refill amlodipine  prescription. - Start hydralazine 25 mg TID. - Provide education on hypertensive urgency versus emergency.  Paroxsymal Atrial fibrillation Chronic atrial fibrillation managed with anticoagulation. Current anticoagulation with Eliquis  (apixaban ). - Continue Eliquis  (apixaban ) for anticoagulation. Last AF 05/16/23  End stage renal disease Chronic end stage renal  disease requiring dialysis. Blood pressure  management primarily overseen by nephrology.  Obstructive sleep apnea Suspected obstructive sleep apnea with a STOP-Bang score of 4. Symptoms include daytime somnolence and nocturnal sweating. He is interested in pursuing testing locally. - Order home sleep study for obstructive sleep apnea testing.  Follow up with Lovette Rud PA-C 4 months One year with me  Gloriann Larger, MD FASE [facility] Cardiologist [facility]  [address] [place], [address] [place], [place] [address] [phone]  9:32 AM

## 2023-08-22 NOTE — Telephone Encounter (Addendum)
 Patient agreement reviewed and signed on 08/22/2023.  WatchPAT issued to patient on 08/22/2023 by Nathalie Baize. Patient aware to not open the WatchPAT box until contacted with the activation PIN. Patient profile initialized in CloudPAT on 08/22/2023 by Jenise Mixer. Device serial number: [id number]  Please list Reason for Call as Advice Only and type "WatchPAT issued to patient" in the comment box.

## 2023-09-29 ENCOUNTER — Other Ambulatory Visit: Payer: Self-pay | Admitting: *Deleted

## 2023-09-29 DIAGNOSIS — M79603 Pain in arm, unspecified: Secondary | ICD-10-CM

## 2023-09-29 DIAGNOSIS — N186 End stage renal disease: Secondary | ICD-10-CM

## 2023-10-18 ENCOUNTER — Encounter: Payer: Self-pay | Admitting: Vascular Surgery

## 2023-10-18 ENCOUNTER — Ambulatory Visit (HOSPITAL_COMMUNITY)
Admission: RE | Admit: 2023-10-18 | Discharge: 2023-10-18 | Disposition: A | Discharge status: Home / Self Care | Source: Ambulatory Visit | Attending: Vascular Surgery | Admitting: Vascular Surgery

## 2023-10-18 ENCOUNTER — Ambulatory Visit: Attending: Vascular Surgery | Admitting: Vascular Surgery

## 2023-10-18 VITALS — BP 161/87 | HR 86 | Temp 98.1°F | Resp 20 | Ht 68.0 in | Wt 210.0 lb

## 2023-10-18 DIAGNOSIS — M79603 Pain in arm, unspecified: Secondary | ICD-10-CM | POA: Diagnosis not present

## 2023-10-18 DIAGNOSIS — N186 End stage renal disease: Secondary | ICD-10-CM

## 2023-10-18 NOTE — Progress Notes (Signed)
 Patient ID: [name], male   DOB: 1975/05/28, 48 y.o.   MRN: [id number]  Reason for Consult: New Patient (Initial Visit)   Referred by Florencio Clap, MD  Subjective:     HPI:  [name] is a 48 y.o. male new to our system with end-stage renal disease on dialysis at home via right forearm AV fistula.  He is left-hand dominant cannulate the fistula itself.  He is concerned because he new to the area and wants to establish care.  He has had some increased bleeding from one of his needlesticks but mostly his fistula is working well without alarms.  Past Medical History:  Diagnosis Date   ESRD (end stage renal disease) (HCC)    Hypertension    Family History  Problem Relation Age of Onset   Diabetes Mother    Lung disease Father    Liver disease Neg Hx    Esophageal cancer Neg Hx    Colon cancer Neg Hx    Past Surgical History:  Procedure Laterality Date   AV FISTULA PLACEMENT Right 2021   BIOPSY  12/22/2022   Procedure: BIOPSY;  Surgeon: Federico Rosario BROCKS, MD;  Location: [facility];  Service: Gastroenterology;;   COLONOSCOPY WITH PROPOFOL  N/A 12/22/2022   Procedure: COLONOSCOPY WITH PROPOFOL ;  Surgeon: Federico Rosario BROCKS, MD;  Location: [facility];  Service: Gastroenterology;  Laterality: N/A;   ESOPHAGOGASTRODUODENOSCOPY (EGD) WITH PROPOFOL  N/A 12/22/2022   Procedure: ESOPHAGOGASTRODUODENOSCOPY (EGD) WITH PROPOFOL ;  Surgeon: Federico Rosario BROCKS, MD;  Location: [facility];  Service: Gastroenterology;  Laterality: N/A;   EYE SURGERY     POLYPECTOMY  12/22/2022   Procedure: POLYPECTOMY;  Surgeon: Federico Rosario BROCKS, MD;  Location: [facility];  Service: Gastroenterology;;    Short Social History:  Social History   Tobacco Use   Smoking status: Never   Smokeless tobacco: Never  Substance Use Topics   Alcohol use: Not on file    Allergies  Allergen Reactions   Ms Contin [Morphine] Anaphylaxis and Swelling    Full body swelling   Sulfa Antibiotics Swelling     Facial and eye swelling    Current Outpatient Medications  Medication Sig Dispense Refill   acetaminophen  (TYLENOL ) 325 MG tablet Take 2 tablets (650 mg total) by mouth every 6 (six) hours as needed for mild pain (pain score 1-3) (or Fever >/= 101). 20 tablet 0   amLODipine  (NORVASC ) 10 MG tablet Take 1 tablet (10 mg total) by mouth daily. 90 tablet 3   apixaban  (ELIQUIS ) 5 MG TABS tablet Take 1 tablet (5 mg total) by mouth 2 (two) times daily. 60 tablet 0   calcium  acetate (PHOSLO) 667 MG capsule daily at 6 (six) AM.     Cholecalciferol 50 MCG (2000 UT) CAPS Take 2,000 Units by mouth daily at 6 (six) AM.     Cyanocobalamin  (VITAMIN B-12 PO) Take 1 tablet by mouth daily.     doxazosin  (CARDURA ) 2 MG tablet Take 2 mg by mouth at bedtime.     ferric citrate  (AURYXIA ) 1 GM 210 MG(Fe) tablet Take 420 mg by mouth daily before breakfast.     hydrALAZINE  (APRESOLINE ) 25 MG tablet Take 1 tablet (25 mg total) by mouth 3 (three) times daily. 270 tablet 3   levothyroxine  (SYNTHROID ) 25 MCG tablet Take 1 tablet (25 mcg total) by mouth daily at 6 (six) AM. 30 tablet 0   Methoxy PEG-Epoetin Beta (MIRCERA IJ) Inject 150 mcg into the skin as directed. For dialysis  metoCLOPramide  (REGLAN ) 5 MG tablet Take 1 tablet (5 mg total) by mouth every 6 (six) hours as needed for nausea or vomiting. 30 tablet 0   metoprolol  succinate (TOPROL -XL) 50 MG 24 hr tablet Take 1 tablet (50 mg total) by mouth daily. Take with or immediately following a meal. 30 tablet 0   pantoprazole  (PROTONIX ) 40 MG tablet Take 1 tablet (40 mg total) by mouth 2 (two) times daily before a meal. 60 tablet 2   spironolactone (ALDACTONE) 25 MG tablet Take 25 mg by mouth daily.     No current facility-administered medications for this visit.    Review of Systems  Constitutional:  Constitutional negative. HENT: HENT negative.  Eyes: Eyes negative.  Respiratory: Respiratory negative.  Cardiovascular: Cardiovascular negative.  GI:  Gastrointestinal negative.  Musculoskeletal: Musculoskeletal negative.  Skin: Skin negative.  Neurological: Neurological negative. Hematologic: Hematologic/lymphatic negative.  Psychiatric: Psychiatric negative.        Objective:  Objective   Vitals:   10/18/23 1127  BP: (!) 161/87  Pulse: 86  Resp: 20  Temp: 98.1 F (36.7 C)  SpO2: 98%  Weight: 210 lb (95.3 kg)  Height: 5' 8 (1.727 m)   Body mass index is 31.93 kg/m.  Physical Exam HENT:     Head: Normocephalic.     Nose: Nose normal.   Eyes:     Pupils: Pupils are equal, round, and reactive to light.    Cardiovascular:     Rate and Rhythm: Normal rate.  Pulmonary:     Effort: Pulmonary effort is normal.  Abdominal:     General: Abdomen is flat.   Musculoskeletal:     Right lower leg: No edema.     Left lower leg: No edema.     Comments: Fistula in right upper arm with very strong thrill   Skin:    General: Skin is warm.     Capillary Refill: Capillary refill takes less than 2 seconds.   Neurological:     General: No focal deficit present.     Mental Status: He is alert.   Psychiatric:        Mood and Affect: Mood normal.        Thought Content: Thought content normal.        Judgment: Judgment normal.     Data: Findings:  +--------------------+----------+-----------------+------------------------  ----+  AVF                PSV (cm/s)Flow Vol (mL/min)          Comments             +--------------------+----------+-----------------+------------------------  ----+  Native artery inflow   267           979                                      +--------------------+----------+-----------------+------------------------  ----+  AVF Anastomosis        615                        There appears to be  a  pseudoaneurysm at  the                                                      anastomosis0.91 x 2.12  cm     +--------------------+----------+-----------------+------------------------  ----+     +------------+----------+-------------+----------+--------+  OUTFLOW VEINPSV (cm/s)Diameter (cm)Depth (cm)Describe  +------------+----------+-------------+----------+--------+  Prox Forearm    71        0.87        0.83             +------------+----------+-------------+----------+--------+  Mid Forearm     61        1.31        0.80             +------------+----------+-------------+----------+--------+  Dist Forearm   423        0.82        0.21             +------------+----------+-------------+----------+--------+        Summary:  Patent right RC AVF.  No areas of stenosis or thrombus.  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      Assessment/Plan:     47-year male with end-stage renal disease on home dialysis via right forearm AV fistula.  Currently is not having any issues other than some minimal bleeding and we discussed alternative areas to stick the fistula.  If there are further issues we can perform fistulogram without further evaluation he will otherwise follow-up on an as-needed basis.    Penne Lonni Colorado MD [facility]

## 2023-12-18 NOTE — Progress Notes (Unsigned)
 Cardiology Office Note    Date:  12/20/2023  ID:  [name], DOB [birth year], MRN [id number] PCP:  Tim Clap, MD  Cardiologist:  Tim Emmer, MD  Electrophysiologist:  None    History of Present Illness: Tim    [name] is a 48 y.o. male with visit-pertinent history of ESRD, HTN, DM, abnormal TSH, gastroparesis, mild cardiomyopathy seen for follow-up.  He was previously following at [facility] for possible transplant workup. 2D echo showed EF 50-55% in 10/2022, underwent cath (outlinedbelow) in 10/2022 with without significant CAD. He was recently admitted to our hospital with fever, cough, nausea, vomiting. Found to have RML pneumonia and newly diagnosed decreased LVEF, possibly stress induced versus related to hypertension. 2d Echo 01/06/23 showed EF 45-50% with global HK, G2DD, mildly enlarged RV, trivial MR, mildly dilated PA, dilated IVC. He did not previously tolerate carvedilol  due to fatigue but was doing well back on it in the hospital. He was also started on Entresto , OK'd by nephro. TSH was abnormal with normal FT4.  He was seen by me 10/2, he felt nausea this morning and his head was hurting and dizziness when he laid flat. Some vomiting this morning but just clear liquids. He took two tylenol  for pain and that did not come up. This was the first time he was sick since the hospital. He had dialysis yesterday and he thinks this might have something to do with this.  He took his medications this morning about 1 hour ago. He does his dialysis at home. He has BP cuffs at home to check his pressure. I encouraged him to keep track at home and call if diastolic continues to be elevated.  Echocardiogram ordered 11/4 showing possible amyloidosis with EF 35 to 40%.  Restrictive diastolic dysfunction.  PYP scan, SPEP, and UPEP ordered.  I saw him 04/2023, he presents with a history of hypertension and on home dialysis, with ongoing issues of high blood pressure. He reports that his blood  pressure readings at home have been consistently high, mirroring the readings taken during the clinic visit. The patient attributes his high blood pressure to a combination of factors, including stress and financial constraints that affect his ability to consistently take his prescribed medications.  The patient's stress levels are reportedly high due to personal circumstances, including a challenging home environment and financial difficulties. He expresses a desire [place]. The patient also mentions that he finds relief from stress through activities such as shooting guns and playing golf, which he is unable to do in his current city residence.  The patient's financial constraints are a significant barrier to medication compliance. He reports struggling to afford his medications, particularly his Entresto  prescription, which is a key part of his hypertension management. The patient is open to adjusting his medication regimen to make it more affordable and manageable.  Reports no shortness of breath nor dyspnea on exertion. Reports no chest pain, pressure, or tightness. No edema, orthopnea, PND. Reports no palpitations.   Discussed the use of AI scribe software for clinical note transcription with the patient, who gave verbal consent to proceed.  Today, he presents with hx of atrial fibrillation for cardiovascular follow-up.  He has a history of atrial fibrillation, previously managed with chemical cardioversion, with no further episodes since the procedure. A monitor in February showed no atrial fibrillation.  He is off his Eliquis  due to cost.  His  blood pressure typically ranges from 140s to 150s over 85. He is on metoprolol , increased to 50 mg daily, with no significant change in symptoms. He attributes some blood pressure issues to stress.  He is undergoing dialysis and feels better with more energy. He walks daily  to stay active and monitors fluid intake and weight closely, adjusting dialysis as needed. He wears compression socks to manage leg swelling, which occurs with prolonged standing.  He has been unable to tolerate carvedilol  so he has been on metoprolol .  He experiences nausea and vomiting in the mornings, particularly with light exposure, and uses Reglan . He has a history of light sensitivity and wears shades. He reports shortness of breath on Coreg , which was switched due to side effects. He follows a fluid restriction of 32 ounces but feels it is insufficient as he can still urinate.  Reports no shortness of breath nor dyspnea on exertion. Reports no chest pain, pressure, or tightness. No edema, orthopnea, PND. Reports no palpitations.   Discussed the use of AI scribe software for clinical note transcription with the patient, who gave verbal consent to proceed.   Labwork independently reviewed: 12/2022 Hgb 10.9, plt 191, K 4.0, Cr 10.47, LDL 46, trig 116, TSH 16 with normal FT4, alb 3.2, AST 12, ALT OK  ROS: .    Please see the history of present illness.  All other systems are reviewed and otherwise negative.  Studies Reviewed: Tim    EKG:  Not ordered today  CV Studies: Cardiac studies reviewed are outlined and summarized above. Otherwise please see EMR for full report.  2d echo 12/2022   1. Left ventricular ejection fraction, by estimation, is 40 to 45%. The  left ventricle has mildly decreased function. The left ventricle  demonstrates global hypokinesis. There is moderate left ventricular  hypertrophy. Left ventricular diastolic  parameters are consistent with Grade II diastolic dysfunction  (pseudonormalization).   2. Right ventricular systolic function is normal. The right ventricular  size is mildly enlarged. Tricuspid regurgitation signal is inadequate for  assessing PA pressure.   3. Left atrial size was mildly dilated.   4. The mitral valve is normal in structure. Trivial  mitral valve  regurgitation. No evidence of mitral stenosis.   5. The aortic valve is tricuspid. Aortic valve regurgitation is not  visualized. No aortic stenosis is present.   6. Mildly dilated pulmonary artery.   7. The inferior vena cava is dilated in size with <50% respiratory  variability, suggesting right atrial pressure of 15 mmHg.    CareEverywhere 10/2022  Procedure: A two-dimensional transthoracic echocardiogram with color flow and Doppler was performed. Study Quality: Fair.  Left Ventricle: The left ventricle is normal in size. There is mild concentric left ventricular hypertrophy. Ejection Fraction = 50-55%. The left ventricular wall motion is normal. Diastolic dysfunction, Grade III (restrictive pattern), consistent with markedly increased left atrial pressure.  Left Atrium: The left atrium is mildly dilated. Left atrial index is 8ml/m^2.  Right Atrium: Right atrial size is normal. 18cm2  Right Ventricle: The right ventricle is normal size. The right ventricular systolic function is normal.  Aortic Valve: The aortic valve is trileaflet. The aortic valve opens well. Trace (trivial) aortic regurgitation.  Mitral Valve: The mitral valve leaflets appear normal. There is no evidence of stenosis, fluttering, or prolapse. There is mild mitral regurgitation.  Tricuspid Valve: Structurally normal tricuspid valve. There is mild tricuspid regurgitation. Estimated right atrial pressure is 5 mmHg.Tim RVSP not able to be calculated.  Pulmonic Valve: Structurally normal pulmonic valve. Mild pulmonic valvular regurgitation.  Arteries: The aortic root is normal size. Borderline dilated ascending aorta.  Venous: Mildly dilated inferior vena cava.  Pericardium/Pleura: Trivial pericardial effusion.   Cath 10/2022  INTERVENTIONAL CARDIOLOGIST: Lonni Hacker, MD GENERAL CARDIOLOGY FELLOW: Varney Bail, MD INTERVENTIONAL FELLOW: None DATE OF  PROCEDURE: 11/17/22  PROCEDURES: Ultrasound-guided Right Femoral artery access Ultrasound-guided Right Femoral vein access Selective Left and right coronary system angiography Right heart catheterization Conscious sedation monitoring time: 75 minutes  INDICATIONS: Pre-operative evaluation  HISTORY: Trebor Galdamez is a 48yrs old Male with past medical history of HTN, DM-2, ESRD on home dialysis was seen in the cardiology clinic for a pre operative cardiac risk stratification ahead of being listed for a renal transplant . He was brought to the cardiac catheterization lab for right heart catheterization and coronary angiography and possible revascularization.  CONSENT: After the risk and benefits were explained to the patient, he was able to reiterate the risk and benefits along with the details of the procedure and consented to the procedure.  CATHETERIZATION LABORATORY STATISTICS: Total contrast used for this case was 50 cc of Visipaque contrast. Total fluoroscopic time for this case was 13.8 minutes. Total dose area product was 2850 cGycm2 Cumulative Air Kerma was 301 mGy Blood Loss: Less than 10mL  DESCRIPTION OF PROCEDURE: The patient was brought to the Cardiac Catheterization Laboratory in stable fasting condition. After informed consent was obtained and a WHO time out was performed with the entire cath lab team present before commencing the procedure. Local anesthesia administered with 2% Lidocaine  solution subcutaneously.The patient was provided with conscious sedation with a total of 1mg  intravenous Versed. Arterial access was achieved with a micro puncture under ultrasound guidance and a 6 Fr Ultrasound guided Right Femoral artery sheath was inserted. Venous access was achieved with a micro puncture under ultrasound guidance and a 7 Fr Ultrasound guided Right Femoral vein sheath was inserted. Right heart catheterization was performed with a 7 Fr Arrow thermodilution catheter A 6 Fr JL  4.0 catheter was used to engage the left coronary artery. A 6 Fr JR 4.0 catheter was used to engage the right coronary artery. The LV cavity was not entered. Multiple angiographic views were obtained to delineate the coronary anatomy. 0.035 145cm J wire Guidewires were used during the procedure. Arterial hemostasis was achieved with Manual compression. Venous hemostasis was achieved with manual compression. All catheters and wires were removed at the end of the procedure.   HEMODYNAMIC DATA: Aortic blood pressure is 185/96 mmHg with a mean of 138 mmHg. Right atrial pressure mean is 8 mmHg. Right ventricular pressure is 62/10 mmHg Pulmonary arterial pressure is 56/22 mmHg with mean of 42 mmHg. Pulmonary wedge catheter pressure mean is 21 mmHg. V wave is 32 mmHg.  SATURATION DATA: 1. Hemoglobin is 10 g/dL. 2. Right atrial saturation is 75 %. 3. Pulmonary artery saturation is 78 %. 4. Aortic saturation is 93 %.  USING MODIFIED FICK METHOD: Cardiac Output is 13 L/min. Cardiac index is 6.3 L/min/m-2 SVR is 800 dsc-5 PVR is 1.6 Woods Units.  ANGIOGRAPHIC DATA:  Left main coronary artery: Large caliber vessel. Angiographically, no evidence of obstructive disease.  Left anterior descending coronary artery (LAD): Large caliber vessel, giving rise to Septal perforators and a large diagonal. LAD wraps around the apex. Angiographically, no evidence of obstructive disease.  Left circumflex coronary artery (LCx): Large caliber vessel which gives off a large OM1 branch before continuing as AV grrove LCx.  OM1 bifurcates into superior and inferior branches. There is no evidence of obstructive disease.  Right coronary artery (RCA): Large caliber vessel. Dominant. Distally bifurcates into PDA and PLV. Angiographically, no evidence of obstructive disease.  IMPRESSION: No evidence of epicardial CAD. Moderate post capillary pulmonary HTN.  RECOMMENDATIONS: Continue aggressive risk factor  modifications for prevention of CAD. Management of pulmonary hypertension per primary cardiology team.  The results of the procedure were discussed with the patient.  Lonni Hacker, MD was present and supervised during the entire procedure    Patient Profile     48 y.o. male with ESRD, HTN, DM, gastroparesis admitted with fever, cough, nausea, vomiting. Found to have RML pneumonia and newly diagnosed decreased LVEF. Med management complicated by nausea, vomiting, gastroparesis. TSH also abnormal recommended for OP repeat. Of note, patient was following at [facility] recently (had moved to [facility]) and had OV with cardiology there 10/2022, EF 50-55% by echo then, recommended for [facility] in preparation for renal transplant eval, do not see completed.    Current Reported Medications:.    Current Meds  Medication Sig   acetaminophen  (TYLENOL ) 325 MG tablet Take 2 tablets (650 mg total) by mouth every 6 (six) hours as needed for mild pain (pain score 1-3) (or Fever >/= 101).   amLODipine  (NORVASC ) 10 MG tablet Take 1 tablet (10 mg total) by mouth daily.   calcium  acetate (PHOSLO) 667 MG capsule daily at 6 (six) AM.   Cholecalciferol 50 MCG (2000 UT) CAPS Take 2,000 Units by mouth daily at 6 (six) AM.   Cyanocobalamin  (VITAMIN B-12 PO) Take 1 tablet by mouth daily.   doxazosin  (CARDURA ) 2 MG tablet Take 2 mg by mouth at bedtime.   ferric citrate  (AURYXIA ) 1 GM 210 MG(Fe) tablet Take 420 mg by mouth daily before breakfast.   hydrALAZINE  (APRESOLINE ) 25 MG tablet Take 1 tablet (25 mg total) by mouth 3 (three) times daily.   levothyroxine  (SYNTHROID ) 25 MCG tablet Take 1 tablet (25 mcg total) by mouth daily at 6 (six) AM.   Methoxy PEG-Epoetin Beta (MIRCERA IJ) Inject 150 mcg into the skin as directed. For dialysis   metoCLOPramide  (REGLAN ) 5 MG tablet Take 1 tablet (5 mg total) by mouth every 6 (six) hours as needed for nausea or vomiting.   metoprolol  succinate (TOPROL -XL) 50 MG 24 hr tablet Take 1  tablet (50 mg total) by mouth daily. Take with or immediately following a meal.   pantoprazole  (PROTONIX ) 40 MG tablet Take 1 tablet (40 mg total) by mouth 2 (two) times daily before a meal.   spironolactone  (ALDACTONE ) 25 MG tablet Take 25 mg by mouth daily.    Physical Exam:    VS:  BP (!) 144/90   Pulse 86   Ht 5' 8 (1.727 m)   Wt 213 lb 3.2 oz (96.7 kg)   SpO2 98%   BMI 32.42 kg/m    Wt Readings from Last 3 Encounters:  12/20/23 213 lb 3.2 oz (96.7 kg)  10/18/23 210 lb (95.3 kg)  08/22/23 208 lb 3.2 oz (94.4 kg)    GEN: Well nourished, well developed in no acute distress NECK: No JVD; No carotid bruits CARDIAC: RRR, no murmurs, rubs, gallops RESPIRATORY:  Clear to auscultation without rales, wheezing or rhonchi  ABDOMEN: Soft, non-tender, non-distended EXTREMITIES:  No edema; No acute deformity, R radial thrill due to HD access  Asessement and Plan:.    Atrial fibrillation, history of chemical cardioversion No recent atrial fibrillation episodes. February monitor showed  short tachycardia runs.  -CHA2DS2-VASc score of 3 which indicates a 3.2% annual stroke risk - Send message to Dr. Santo  regarding Eliquis   - Instruct to report symptoms of palpitations immediately. - Consider EKG or monitor if symptoms occur.  Hypertension in end-stage renal disease on dialysis Blood pressure slightly elevated. Current metoprolol  dose recently increased. Goal is 140/80 mmHg. Discussed increasing metoprolol  to 75 mg for better control, with potential side effects and plan to revert if adverse effects occur. - Increase metoprolol  to 75 mg daily. - Monitor blood pressure regularly. -He does occasionally have drops in BP due to dialysis - Report if diastolic pressure remains in the 90s or if adverse effects occur.  Chronic nausea and morning vomiting with photophobia Chronic nausea and vomiting with photophobia. Reglan  ineffective. Discussed medical cannabis for symptom relief. -  Discuss potential use of medical cannabis with primary care provider. -Do not recommend recreational use  Peripheral edema, intermittent Intermittent peripheral edema in legs with prolonged standing. Managed with compression socks and fluid monitoring. - Continue use of compression socks. - Monitor fluid intake.  Renal Failure/HD dependent  He was declined for transplant status. Negative workup for amyloidosis. He is on home dialysis. -Continue current management plan.  HFrEF -Recent echocardiogram reviewed -No medication changes made today -Would encourage continuing metoprolol  25 mg daily (he cannot tolerate carvedilol ) -Continue hydralazine  25 mg 3 times daily -Entresto  discontinued due to cost    Disposition: F/u with Dr. Santo in 3-4 months  Signed, Orren LOISE Fabry, PA-C

## 2023-12-20 ENCOUNTER — Ambulatory Visit: Attending: Physician Assistant | Admitting: Physician Assistant

## 2023-12-20 ENCOUNTER — Ambulatory Visit: Admitting: Physician Assistant

## 2023-12-20 ENCOUNTER — Encounter: Payer: Self-pay | Admitting: Physician Assistant

## 2023-12-20 VITALS — BP 144/90 | HR 86 | Ht 68.0 in | Wt 213.2 lb

## 2023-12-20 DIAGNOSIS — N186 End stage renal disease: Secondary | ICD-10-CM

## 2023-12-20 DIAGNOSIS — R600 Localized edema: Secondary | ICD-10-CM

## 2023-12-20 DIAGNOSIS — I10 Essential (primary) hypertension: Secondary | ICD-10-CM | POA: Diagnosis not present

## 2023-12-20 DIAGNOSIS — I5020 Unspecified systolic (congestive) heart failure: Secondary | ICD-10-CM

## 2023-12-20 MED ORDER — AMLODIPINE BESYLATE 10 MG PO TABS: 10.0000 mg | ORAL_TABLET | Freq: Every day | ORAL | 3 refills | Status: AC

## 2023-12-20 MED ORDER — SPIRONOLACTONE 25 MG PO TABS: 25.0000 mg | ORAL_TABLET | Freq: Every day | ORAL | 3 refills | Status: DC

## 2023-12-20 MED ORDER — HYDRALAZINE HCL 25 MG PO TABS: 25.0000 mg | ORAL_TABLET | Freq: Three times a day (TID) | ORAL | 3 refills | Status: DC

## 2023-12-20 MED ORDER — METOPROLOL SUCCINATE ER 50 MG PO TB24: 75.0000 mg | ORAL_TABLET | Freq: Every day | ORAL | 3 refills | Status: AC

## 2023-12-20 NOTE — Patient Instructions (Signed)
 Medication Instructions:  Your physician has recommended you make the following change in your medication:  INCREASE TOPROL  TO 75 MG DAILY   *If you need a refill on your cardiac medications before your next appointment, please call your pharmacy*  Lab Work: NONE If you have labs (blood work) drawn today and your tests are completely normal, you will receive your results only by: MyChart Message (if you have MyChart) OR A paper copy in the mail If you have any lab test that is abnormal or we need to change your treatment, we will call you to review the results.  Testing/Procedures: NONE  Follow-Up: At [facility], you and your health needs are our priority.  As part of our continuing mission to provide you with exceptional heart care, our providers are all part of one team.  This team includes your primary Cardiologist (physician) and Advanced Practice Providers or APPs (Physician Assistants and Nurse Practitioners) who all work together to provide you with the care you need, when you need it.  Your next appointment:   3-4 month(s)  Provider:   DR. [facility]   We recommend signing up for the patient portal called MyChart.  Sign up information is provided on this After Visit Summary.  MyChart is used to connect with patients for Virtual Visits (Telemedicine).  Patients are able to view lab/test results, encounter notes, upcoming appointments, etc.  Non-urgent messages can be sent to your provider as well.   To learn more about what you can do with MyChart, go to [email or web address].   Other Instructions Please check your blood pressure 1-2 times per day and bring readings to next appointment

## 2023-12-21 ENCOUNTER — Telehealth: Payer: Self-pay | Admitting: Licensed Clinical Social Worker

## 2023-12-21 NOTE — Telephone Encounter (Signed)
 H&V Care Navigation CSW Progress Note  Clinical Social Worker contacted patient by phone to f/u on patient assistance. LCSW reached pt at [phone]. Introduced self, role, reason for call. Confirmed full name and DOB, number listed for pt and father in law. Confirmed home address, and insurance coverage is now [employer] Pt married, resides with family, currently receives social security benefits. Pt shares the cost of his Eliquis  and Entresto  since switching from commercial insurance copay cards and becoming Medicare eligible is not manageable. We discussed referral to [facility] team at [place] [employer] that should be able to assist pt with applying for Extra Help/LIS. Pt singular income would make him eligible but it does depend what his wife's income is. He was picking up his child from school, is okay with [facility] team reaching out to him tomorrow for application on telephone. I will f/u and ensure pt able to be reached by [facility] team/complete application/answer any additional questions next Tuesday. Pt encouraged to call me before that time as needed.  I securely emailed Medford Mitchell-McFayden with [employer] providing pt name and phone number and request for assistance. I also reached back out to providers/pharmacy with update from this call.   Patient is participating in a Managed Medicaid Plan:  No, BCBS medicare  SDOH Screenings   Food Insecurity: No Food Insecurity (05/18/2023)  Housing: Low Risk  (05/18/2023)  Transportation Needs: No Transportation Needs (05/18/2023)  Utilities: Not At Risk (05/18/2023)  Financial Resource Strain: Medium Risk (12/21/2023)  Physical Activity: Insufficiently Active (10/30/2019)   Received from [facility]  Social Connections: Moderately Isolated (11/16/2018)   Received from [facility]  Stress: No Stress Concern Present (11/16/2018)   Received from [facility]  Tobacco Use: Low Risk   (12/20/2023)  Health Literacy: Adequate Health Literacy (12/21/2023)    Marit Lark, MSW, LCSW Clinical Social Worker II [facility] Heart/Vascular Care Navigation  [phone]- work cell phone (preferred)

## 2023-12-26 ENCOUNTER — Telehealth: Payer: Self-pay | Admitting: Licensed Clinical Social Worker

## 2023-12-26 NOTE — Telephone Encounter (Signed)
 H&V Care Navigation CSW Progress Note  Clinical Social Worker contacted patient by phone to f/u on referral to [facility]. No answer today, voicemail full at [phone]. Will re-attempt again as able.  Patient is participating in a Managed Medicaid Plan:  No, BCBS Medicare  SDOH Screenings   Food Insecurity: No Food Insecurity (05/18/2023)  Housing: Low Risk  (05/18/2023)  Transportation Needs: No Transportation Needs (05/18/2023)  Utilities: Not At Risk (05/18/2023)  Financial Resource Strain: Medium Risk (12/21/2023)  Physical Activity: Insufficiently Active (10/30/2019)   Received from [facility]  Social Connections: Moderately Isolated (11/16/2018)   Received from [facility]  Stress: No Stress Concern Present (11/16/2018)   Received from [facility]  Tobacco Use: Low Risk  (12/20/2023)  Health Literacy: Adequate Health Literacy (12/21/2023)    Marit Lark, MSW, LCSW Clinical Social Worker II [facility] Heart/Vascular Care Navigation  [phone]- work cell phone (preferred)

## 2023-12-27 ENCOUNTER — Telehealth: Payer: Self-pay | Admitting: Licensed Clinical Social Worker

## 2023-12-27 NOTE — Telephone Encounter (Signed)
 H&V Care Navigation CSW Progress Note  Clinical Social Worker contacted patient by phone to f/u on referral to [facility]. No answer again today, voicemail still full at [phone]. Will re-attempt again as able.   Patient is participating in a Managed Medicaid Plan:  No, BCBS Medicare  SDOH Screenings   Food Insecurity: No Food Insecurity (05/18/2023)  Housing: Low Risk  (05/18/2023)  Transportation Needs: No Transportation Needs (05/18/2023)  Utilities: Not At Risk (05/18/2023)  Financial Resource Strain: Medium Risk (12/21/2023)  Physical Activity: Insufficiently Active (10/30/2019)   Received from [facility]  Social Connections: Moderately Isolated (11/16/2018)   Received from [facility]  Stress: No Stress Concern Present (11/16/2018)   Received from [facility]  Tobacco Use: Low Risk  (12/20/2023)  Health Literacy: Adequate Health Literacy (12/21/2023)    Marit Lark, MSW, LCSW Clinical Social Worker II [facility] Heart/Vascular Care Navigation  [phone]- work cell phone (preferred)

## 2023-12-28 ENCOUNTER — Telehealth: Payer: Self-pay | Admitting: Licensed Clinical Social Worker

## 2023-12-28 NOTE — Telephone Encounter (Signed)
 H&V Care Navigation CSW Progress Note  Clinical Social Worker contacted patient by phone to f/u on referral to [facility]. No answer again today, voicemail still full at [phone]. Will re-attempt again one more as able. Have called three times without response.  Patient is participating in a Managed Medicaid Plan:  No, Blue Medicare   SDOH Screenings   Food Insecurity: No Food Insecurity (05/18/2023)  Housing: Low Risk  (05/18/2023)  Transportation Needs: No Transportation Needs (05/18/2023)  Utilities: Not At Risk (05/18/2023)  Financial Resource Strain: Medium Risk (12/21/2023)  Physical Activity: Insufficiently Active (10/30/2019)   Received from [facility]  Social Connections: Moderately Isolated (11/16/2018)   Received from [facility]  Stress: No Stress Concern Present (11/16/2018)   Received from [facility]  Tobacco Use: Low Risk  (12/20/2023)  Health Literacy: Adequate Health Literacy (12/21/2023)    Marit Lark, MSW, LCSW Clinical Social Worker II [facility] Heart/Vascular Care Navigation  [phone]- work cell phone (preferred)

## 2024-01-01 ENCOUNTER — Other Ambulatory Visit (HOSPITAL_COMMUNITY): Payer: Self-pay

## 2024-01-01 ENCOUNTER — Telehealth: Payer: Self-pay

## 2024-01-01 ENCOUNTER — Telehealth: Payer: Self-pay | Admitting: Licensed Clinical Social Worker

## 2024-01-01 NOTE — Telephone Encounter (Signed)
 Got patient a healthwell grant but it's currently waiting Medicare verification.   Will send billing info and instructions to pharmacy once healthwell verifies medicare coverage.

## 2024-01-01 NOTE — Telephone Encounter (Signed)
 H&V Care Navigation CSW Progress Note  Clinical Social Worker contacted patient by phone to f/u on referral to [facility]. No answer again today, voicemail still full at [phone]. Have attempted 4x, pt without DPR, no MyChart log in since February. LCSW will route this to med assistance team. There are available grants for Eliquis , perhaps they will be able to reach pt. Had referred pt to [facility] team for possible LIS/Medicare Savings Programs.    Patient is participating in a Managed Medicaid Plan:  No, Blue Medicare   SDOH Screenings   Food Insecurity: No Food Insecurity (05/18/2023)  Housing: Low Risk  (05/18/2023)  Transportation Needs: No Transportation Needs (05/18/2023)  Utilities: Not At Risk (05/18/2023)  Financial Resource Strain: Medium Risk (12/21/2023)  Physical Activity: Insufficiently Active (10/30/2019)   Received from [facility]  Social Connections: Moderately Isolated (11/16/2018)   Received from [facility]  Stress: No Stress Concern Present (11/16/2018)   Received from [facility]  Tobacco Use: Low Risk  (12/20/2023)  Health Literacy: Adequate Health Literacy (12/21/2023)     Marit Lark, MSW, LCSW Clinical Social Worker II [facility] Heart/Vascular Care Navigation  [phone]- work cell phone (preferred)

## 2024-01-03 DIAGNOSIS — I5042 Chronic combined systolic (congestive) and diastolic (congestive) heart failure: Secondary | ICD-10-CM | POA: Insufficient documentation

## 2024-01-03 NOTE — Telephone Encounter (Signed)
 Patient Advocate Encounter   The patient was approved for a Healthwell grant that will help cover the cost of ELIQUIS  AND ENTRESTO  Total amount awarded, $4,500.  Effective: 12/02/23 - 11/30/24   APW:[id number] ERW:[id number] Hmnle:[id number] PI:[id number]   Pharmacy provided with approval and processing information.   Ileana Lehmann, CPhT  Pharmacy Patient Advocate Specialist  Direct Number: [phone] Fax: [phone]

## 2024-01-04 ENCOUNTER — Other Ambulatory Visit: Payer: Self-pay | Admitting: Physician Assistant

## 2024-01-04 MED ORDER — SACUBITRIL-VALSARTAN 24-26 MG PO TABS: 1.0000 | ORAL_TABLET | Freq: Two times a day (BID) | ORAL | 2 refills | Status: AC

## 2024-01-04 MED ORDER — APIXABAN 5 MG PO TABS: 5.0000 mg | ORAL_TABLET | Freq: Two times a day (BID) | ORAL | 2 refills | Status: AC

## 2024-01-04 NOTE — Telephone Encounter (Signed)
 H&V Care Navigation CSW Progress Note  Clinical Social Worker contacted patient by phone to f/u one more time about grant. LCSW was able to reach pt today at [phone]. Shared I hadnt been able to reach pt when I have called due to full voicemail. Pt shares he has saved messages from his first wife on there that he does not want to lose, asks if we can text. I share that unfortunately the clinic cannot text from their phone but MyChart is able to be accessed and they can send things that way. Pt requests new re-set link for password to be sent to his cell phone. I have attempted to do so.   Requested new scripts be sent to [facility] since Eliquis  last sent in 04/2023 and Entresto  not on med list, to [employer]. Also collaborating with Madelyn Fleeting to ensure grant info sent in. Remain available as needed. Did again encourage him to reach out to [employer] to complete Extra Help application for more long term support.   Patient is participating in a Managed Medicaid Plan:  No, Blue Medicare  SDOH Screenings   Food Insecurity: No Food Insecurity (05/18/2023)  Housing: Low Risk  (05/18/2023)  Transportation Needs: No Transportation Needs (05/18/2023)  Utilities: Not At Risk (05/18/2023)  Financial Resource Strain: Medium Risk (12/21/2023)  Physical Activity: Insufficiently Active (10/30/2019)   Received from [facility]  Social Connections: Moderately Isolated (11/16/2018)   Received from [facility]  Stress: No Stress Concern Present (11/16/2018)   Received from [facility]  Tobacco Use: Low Risk  (12/20/2023)  Health Literacy: Adequate Health Literacy (12/21/2023)    Marit Lark, MSW, LCSW Clinical Social Worker II [facility] Heart/Vascular Care Navigation  [phone]- work cell phone (preferred)

## 2024-01-04 NOTE — Progress Notes (Signed)
 Previously on Entresto  and Eliquis  but patient was not taking due to cost. Lorrene has been issues and new prescriptions set in.   I would have renal repeat labs at next visit with him.   Orren LOISE Fabry, PA-C

## 2024-01-09 NOTE — Telephone Encounter (Signed)
 H&V Care Navigation CSW Progress Note  Clinical Social Worker contacted [facility] to f/u on scripts after call center wasn't able to locate [employer], was transferred to pharmacy at store. They confirmed they have Eliquis  and Entresto . Asked us  to send grant information again. Appreciate [facility] Med[facility]istance team Sidra, [employer]) for sending that information again. Called pt twice, was able to reach second time, explained meds are ready and grant information sent, encouraged him to pick those up, if copay not $0 to call us . Emphasized trying not to miss any more doses if possible. Pt states understanding. Remain available as needed.  Patient is participating in a Managed Medicaid Plan:  No, BCBS Medicare  SDOH Screenings   Food Insecurity: No Food Insecurity (05/18/2023)  Housing: Low Risk  (05/18/2023)  Transportation Needs: No Transportation Needs (05/18/2023)  Utilities: Not At Risk (05/18/2023)  Financial Resource Strain: Medium Risk (12/21/2023)  Physical Activity: Insufficiently Active (10/30/2019)   Received from [facility]  Social Connections: Moderately Isolated (11/16/2018)   Received from [facility]  Stress: No Stress Concern Present (11/16/2018)   Received from [facility]  Tobacco Use: Medium Risk (01/05/2024)   Received from [facility]  Health Literacy: Adequate Health Literacy (12/21/2023)    Marit Lark, MSW, LCSW Clinical Social Worker II [facility] Heart/Vascular Care Navigation  [phone]- work cell phone (preferred)

## 2024-03-12 ENCOUNTER — Ambulatory Visit: Admitting: Internal Medicine

## 2024-04-17 ENCOUNTER — Encounter (HOSPITAL_COMMUNITY): Payer: Self-pay

## 2024-04-17 ENCOUNTER — Observation Stay (HOSPITAL_COMMUNITY)
Admission: EM | Admit: 2024-04-17 | Discharge: 2024-04-19 | Disposition: A | Discharge status: Home / Self Care | Attending: Emergency Medicine | Admitting: Emergency Medicine

## 2024-04-17 ENCOUNTER — Emergency Department (HOSPITAL_COMMUNITY)

## 2024-04-17 ENCOUNTER — Other Ambulatory Visit: Payer: Self-pay

## 2024-04-17 DIAGNOSIS — R6883 Chills (without fever): Secondary | ICD-10-CM | POA: Diagnosis present

## 2024-04-17 DIAGNOSIS — Z91158 Patient's noncompliance with renal dialysis for other reason: Secondary | ICD-10-CM | POA: Insufficient documentation

## 2024-04-17 DIAGNOSIS — Z7901 Long term (current) use of anticoagulants: Secondary | ICD-10-CM | POA: Diagnosis not present

## 2024-04-17 DIAGNOSIS — R519 Headache, unspecified: Secondary | ICD-10-CM | POA: Diagnosis not present

## 2024-04-17 DIAGNOSIS — R7989 Other specified abnormal findings of blood chemistry: Secondary | ICD-10-CM | POA: Diagnosis not present

## 2024-04-17 DIAGNOSIS — N186 End stage renal disease: Secondary | ICD-10-CM | POA: Diagnosis not present

## 2024-04-17 DIAGNOSIS — D509 Iron deficiency anemia, unspecified: Secondary | ICD-10-CM | POA: Diagnosis not present

## 2024-04-17 DIAGNOSIS — I480 Paroxysmal atrial fibrillation: Secondary | ICD-10-CM | POA: Insufficient documentation

## 2024-04-17 DIAGNOSIS — Z992 Dependence on renal dialysis: Secondary | ICD-10-CM | POA: Insufficient documentation

## 2024-04-17 DIAGNOSIS — R112 Nausea with vomiting, unspecified: Secondary | ICD-10-CM | POA: Diagnosis present

## 2024-04-17 DIAGNOSIS — E039 Hypothyroidism, unspecified: Secondary | ICD-10-CM | POA: Insufficient documentation

## 2024-04-17 DIAGNOSIS — I120 Hypertensive chronic kidney disease with stage 5 chronic kidney disease or end stage renal disease: Secondary | ICD-10-CM | POA: Diagnosis not present

## 2024-04-17 DIAGNOSIS — I150 Renovascular hypertension: Secondary | ICD-10-CM

## 2024-04-17 DIAGNOSIS — R1115 Cyclical vomiting syndrome unrelated to migraine: Secondary | ICD-10-CM

## 2024-04-17 DIAGNOSIS — I160 Hypertensive urgency: Principal | ICD-10-CM | POA: Insufficient documentation

## 2024-04-17 DIAGNOSIS — Z79899 Other long term (current) drug therapy: Secondary | ICD-10-CM | POA: Diagnosis not present

## 2024-04-17 DIAGNOSIS — Z8679 Personal history of other diseases of the circulatory system: Secondary | ICD-10-CM | POA: Diagnosis not present

## 2024-04-17 DIAGNOSIS — I428 Other cardiomyopathies: Secondary | ICD-10-CM | POA: Insufficient documentation

## 2024-04-17 DIAGNOSIS — Z79890 Hormone replacement therapy (postmenopausal): Secondary | ICD-10-CM | POA: Diagnosis not present

## 2024-04-17 DIAGNOSIS — E1122 Type 2 diabetes mellitus with diabetic chronic kidney disease: Secondary | ICD-10-CM | POA: Diagnosis not present

## 2024-04-17 DIAGNOSIS — K8020 Calculus of gallbladder without cholecystitis without obstruction: Secondary | ICD-10-CM | POA: Insufficient documentation

## 2024-04-17 DIAGNOSIS — K3184 Gastroparesis: Secondary | ICD-10-CM | POA: Diagnosis not present

## 2024-04-17 LAB — COMPREHENSIVE METABOLIC PANEL WITH GFR
ALT: 16 U/L (ref 0–44)
AST: 21 U/L (ref 15–41)
Albumin: 4.5 g/dL (ref 3.5–5.0)
Alkaline Phosphatase: 67 U/L (ref 38–126)
Anion gap: 19 — ABNORMAL HIGH (ref 5–15)
BUN: 40 mg/dL — ABNORMAL HIGH (ref 6–20)
CO2: 18 mmol/L — ABNORMAL LOW (ref 22–32)
Calcium: 8.4 mg/dL — ABNORMAL LOW (ref 8.9–10.3)
Chloride: 97 mmol/L — ABNORMAL LOW (ref 98–111)
Creatinine, Ser: 11.1 mg/dL — ABNORMAL HIGH (ref 0.61–1.24)
GFR, Estimated: 5 mL/min — ABNORMAL LOW
Glucose, Bld: 127 mg/dL — ABNORMAL HIGH (ref 70–99)
Potassium: 4.6 mmol/L (ref 3.5–5.1)
Sodium: 134 mmol/L — ABNORMAL LOW (ref 135–145)
Total Bilirubin: 0.6 mg/dL (ref 0.0–1.2)
Total Protein: 7.6 g/dL (ref 6.5–8.1)

## 2024-04-17 LAB — CBC WITH DIFFERENTIAL/PLATELET
Abs Immature Granulocytes: 0.01 K/uL (ref 0.00–0.07)
Basophils Absolute: 0 K/uL (ref 0.0–0.1)
Basophils Relative: 1 %
Eosinophils Absolute: 0 K/uL (ref 0.0–0.5)
Eosinophils Relative: 0 %
HCT: 32.6 % — ABNORMAL LOW (ref 39.0–52.0)
Hemoglobin: 10.7 g/dL — ABNORMAL LOW (ref 13.0–17.0)
Immature Granulocytes: 0 %
Lymphocytes Relative: 9 %
Lymphs Abs: 0.6 K/uL — ABNORMAL LOW (ref 0.7–4.0)
MCH: 31.2 pg (ref 26.0–34.0)
MCHC: 32.8 g/dL (ref 30.0–36.0)
MCV: 95 fL (ref 80.0–100.0)
Monocytes Absolute: 0.5 K/uL (ref 0.1–1.0)
Monocytes Relative: 9 %
Neutro Abs: 5.1 K/uL (ref 1.7–7.7)
Neutrophils Relative %: 81 %
Platelets: 126 K/uL — ABNORMAL LOW (ref 150–400)
RBC: 3.43 MIL/uL — ABNORMAL LOW (ref 4.22–5.81)
RDW: 14.7 % (ref 11.5–15.5)
WBC: 6.2 K/uL (ref 4.0–10.5)
nRBC: 0 % (ref 0.0–0.2)

## 2024-04-17 LAB — RESP PANEL BY RT-PCR (RSV, FLU A&B, COVID)  RVPGX2
Influenza A by PCR: NEGATIVE
Influenza B by PCR: NEGATIVE
Resp Syncytial Virus by PCR: NEGATIVE
SARS Coronavirus 2 by RT PCR: NEGATIVE

## 2024-04-17 LAB — TROPONIN T, HIGH SENSITIVITY: Troponin T High Sensitivity: 70 ng/L — ABNORMAL HIGH (ref 0–19)

## 2024-04-17 LAB — CBG MONITORING, ED
Glucose-Capillary: 100 mg/dL — ABNORMAL HIGH (ref 70–99)
Glucose-Capillary: 108 mg/dL — ABNORMAL HIGH (ref 70–99)

## 2024-04-17 LAB — LIPASE, BLOOD: Lipase: 16 U/L (ref 11–51)

## 2024-04-17 MED ORDER — HYDROMORPHONE HCL 1 MG/ML IJ SOLN
0.5000 mg | Freq: Once | INTRAMUSCULAR | Status: AC
  Administered 2024-04-17: 0.5 mg via INTRAVENOUS
  Filled 2024-04-17: qty 1

## 2024-04-17 MED ORDER — HYDRALAZINE HCL 20 MG/ML IJ SOLN: 5.0000 mg | Freq: Once | INTRAMUSCULAR | Status: DC

## 2024-04-17 MED ORDER — LORAZEPAM 2 MG/ML IJ SOLN
1.0000 mg | Freq: Once | INTRAMUSCULAR | Status: AC
  Administered 2024-04-17: 1 mg via INTRAVENOUS
  Filled 2024-04-17: qty 1

## 2024-04-17 MED ORDER — HYDRALAZINE HCL 20 MG/ML IJ SOLN
10.0000 mg | Freq: Once | INTRAMUSCULAR | Status: AC
  Administered 2024-04-17: 10 mg via INTRAVENOUS
  Filled 2024-04-17: qty 1

## 2024-04-17 MED ORDER — APIXABAN 5 MG PO TABS
5.0000 mg | ORAL_TABLET | Freq: Two times a day (BID) | ORAL | Status: DC
  Administered 2024-04-17 – 2024-04-19 (×4): 5 mg via ORAL
  Filled 2024-04-17 (×4): qty 1

## 2024-04-17 MED ORDER — SPIRONOLACTONE 25 MG PO TABS
25.0000 mg | ORAL_TABLET | Freq: Every day | ORAL | Status: DC
  Administered 2024-04-18 – 2024-04-19 (×2): 25 mg via ORAL
  Filled 2024-04-17 (×2): qty 1

## 2024-04-17 MED ORDER — LACTATED RINGERS IV BOLUS
500.0000 mL | Freq: Once | INTRAVENOUS | Status: AC
  Administered 2024-04-17: 500 mL via INTRAVENOUS

## 2024-04-17 MED ORDER — LEVOTHYROXINE SODIUM 25 MCG PO TABS
25.0000 ug | ORAL_TABLET | Freq: Every day | ORAL | Status: DC
  Administered 2024-04-18 – 2024-04-19 (×2): 25 ug via ORAL
  Filled 2024-04-17 (×2): qty 1

## 2024-04-17 MED ORDER — METOCLOPRAMIDE HCL 5 MG/ML IJ SOLN
10.0000 mg | Freq: Once | INTRAMUSCULAR | Status: AC
  Administered 2024-04-17: 10 mg via INTRAVENOUS
  Filled 2024-04-17: qty 2

## 2024-04-17 MED ORDER — LABETALOL HCL 5 MG/ML IV SOLN
10.0000 mg | INTRAVENOUS | Status: DC | PRN
  Administered 2024-04-18 – 2024-04-19 (×4): 10 mg via INTRAVENOUS
  Filled 2024-04-17 (×6): qty 4

## 2024-04-17 MED ORDER — IOHEXOL 350 MG/ML SOLN
100.0000 mL | Freq: Once | INTRAVENOUS | Status: AC | PRN
  Administered 2024-04-17: 100 mL via INTRAVENOUS

## 2024-04-17 MED ORDER — AMLODIPINE BESYLATE 5 MG PO TABS
10.0000 mg | ORAL_TABLET | Freq: Every day | ORAL | Status: DC
  Administered 2024-04-18 – 2024-04-19 (×2): 10 mg via ORAL
  Filled 2024-04-17 (×2): qty 2

## 2024-04-17 MED ORDER — HYDRALAZINE HCL 25 MG PO TABS
25.0000 mg | ORAL_TABLET | Freq: Three times a day (TID) | ORAL | Status: DC
  Administered 2024-04-17 – 2024-04-19 (×5): 25 mg via ORAL
  Filled 2024-04-17 (×5): qty 1

## 2024-04-17 MED ORDER — METOPROLOL SUCCINATE ER 50 MG PO TB24
75.0000 mg | ORAL_TABLET | Freq: Every day | ORAL | Status: DC
  Administered 2024-04-18 – 2024-04-19 (×2): 75 mg via ORAL
  Filled 2024-04-17 (×2): qty 1

## 2024-04-17 MED ORDER — LABETALOL HCL 5 MG/ML IV SOLN
20.0000 mg | Freq: Once | INTRAVENOUS | Status: AC
  Administered 2024-04-17: 20 mg via INTRAVENOUS
  Filled 2024-04-17: qty 4

## 2024-04-17 MED ORDER — PROCHLORPERAZINE EDISYLATE 10 MG/2ML IJ SOLN
5.0000 mg | Freq: Four times a day (QID) | INTRAMUSCULAR | Status: AC | PRN
  Administered 2024-04-18 (×3): 5 mg via INTRAVENOUS
  Filled 2024-04-17 (×3): qty 2

## 2024-04-17 MED ORDER — HYDRALAZINE HCL 20 MG/ML IJ SOLN
5.0000 mg | Freq: Once | INTRAMUSCULAR | Status: AC
  Administered 2024-04-17: 5 mg via INTRAVENOUS
  Filled 2024-04-17: qty 1

## 2024-04-17 NOTE — ED Provider Notes (Signed)
 " [place] EMERGENCY DEPARTMENT AT [facility] Provider Note   CSN: [id number] Arrival date & time: 04/17/24  1337     History  Chief Complaint  Patient presents with   Emesis   missed dialysis    [name] is a 48 y.o. male with PMH as listed below who presents BIB EMS with n/v and was unable to do his home dialysis. Last full session was Monday, got a full session. Reports it started yesterday.  CP after nausea vomiting.  Diaphoretic. Endorses chills but no fever. No rhinorrhea/cough/sore throat. Endorses pain down the middle of his chest and abdomen rated 6/10.  CBG 142  BP 228/136  by EMS. First BP on arrival to ED is 159/134. States he had gastroparesis which was similar. Patient actively vomiting on my assessment.    Past Medical History:  Diagnosis Date   ESRD (end stage renal disease) (HCC)    Hypertension        Home Medications Prior to Admission medications  Medication Sig Start Date End Date Taking? Authorizing Provider  acetaminophen  (TYLENOL ) 325 MG tablet Take 2 tablets (650 mg total) by mouth every 6 (six) hours as needed for mild pain (pain score 1-3) (or Fever >/= 101). 05/18/23   Sheikh, Omair Latif, DO  amLODipine  (NORVASC ) 10 MG tablet Take 1 tablet (10 mg total) by mouth daily. 12/20/23   Lucien Orren SAILOR, PA-C  apixaban  (ELIQUIS ) 5 MG TABS tablet Take 1 tablet (5 mg total) by mouth 2 (two) times daily. 01/04/24   Lucien Orren SAILOR, PA-C  calcium  acetate (PHOSLO) 667 MG capsule daily at 6 (six) AM. 07/19/23 07/05/24  [provider]  Cholecalciferol 50 MCG (2000 UT) CAPS Take 2,000 Units by mouth daily at 6 (six) AM. 05/12/23   [provider]  Cyanocobalamin  (VITAMIN B-12 PO) Take 1 tablet by mouth daily.    [provider]  doxazosin  (CARDURA ) 2 MG tablet Take 2 mg by mouth at bedtime. 08/04/22   [provider]  ferric citrate  (AURYXIA ) 1 GM 210 MG(Fe) tablet Take 420 mg by mouth daily before breakfast.     [provider]  hydrALAZINE  (APRESOLINE ) 25 MG tablet Take 1 tablet (25 mg total) by mouth 3 (three) times daily. 12/20/23   Lucien Orren SAILOR, PA-C  levothyroxine  (SYNTHROID ) 25 MCG tablet Take 1 tablet (25 mcg total) by mouth daily at 6 (six) AM. 05/19/23   Sheikh, Omair Latif, DO  Methoxy PEG-Epoetin Beta (MIRCERA IJ) Inject 150 mcg into the skin as directed. For dialysis 07/06/23   [provider]  metoCLOPramide  (REGLAN ) 5 MG tablet Take 1 tablet (5 mg total) by mouth every 6 (six) hours as needed for nausea or vomiting. 01/12/23   Jillian Buttery, MD  metoprolol  succinate (TOPROL -XL) 50 MG 24 hr tablet Take 1.5 tablets (75 mg total) by mouth daily. Take with or immediately following a meal. 12/20/23   Lucien Orren SAILOR, PA-C  pantoprazole  (PROTONIX ) 40 MG tablet Take 1 tablet (40 mg total) by mouth 2 (two) times daily before a meal. 12/22/22   Federico Rosario BROCKS, MD  sacubitril -valsartan  (ENTRESTO ) 24-26 MG Take 1 tablet by mouth 2 (two) times daily. 01/04/24   Lucien Orren SAILOR, PA-C  spironolactone  (ALDACTONE ) 25 MG tablet Take 1 tablet (25 mg total) by mouth daily. 12/20/23   Lucien Orren SAILOR, PA-C      Allergies    Ms contin [morphine] and Sulfa antibiotics    Review of Systems  Review of Systems A 10 point review of systems was performed and is negative unless otherwise reported in HPI.  Physical Exam Updated Vital Signs BP (!) 212/118   Pulse (!) 101   Temp 97.7 F (36.5 C) (Oral)   Resp 15   Ht 5' 8 (1.727 m)   Wt 96.7 kg   SpO2 100%   BMI 32.41 kg/m  Physical Exam General: uncomfortable, actively vomiting male, lying in bed.  HEENT: PERRLA, exotropia and protrusion of R eye (baseline), Sclera anicteric, MMM, trachea midline.  Cardiology: Regular mildly tachycardic rate/rhythm, no murmurs/rubs/gallops. Resp: Normal respiratory effort, mildly tachypneic. CTAB, no wheezes, rhonchi, crackles.  Abd: Soft, non-tender, non-distended. No rebound tenderness or guarding.  GU:  Deferred. MSK: No peripheral edema or signs of trauma. Extremities without deformity or TTP. No cyanosis or clubbing. Skin: warm, dry. Neuro: A&Ox4, CNs II-XII grossly intact. MAEs. Sensation grossly intact.  Psych: Normal mood and affect.   ED Results / Procedures / Treatments   Labs (all labs ordered are listed, but only abnormal results are displayed) Labs Reviewed  CBC WITH DIFFERENTIAL/PLATELET - Abnormal; Notable for the following components:      Result Value   RBC 3.43 (*)    Hemoglobin 10.7 (*)    HCT 32.6 (*)    Platelets 126 (*)    Lymphs Abs 0.6 (*)    All other components within normal limits  COMPREHENSIVE METABOLIC PANEL WITH GFR - Abnormal; Notable for the following components:   Sodium 134 (*)    Chloride 97 (*)    CO2 18 (*)    Glucose, Bld 127 (*)    BUN 40 (*)    Creatinine, Ser 11.10 (*)    Calcium  8.4 (*)    GFR, Estimated 5 (*)    Anion gap 19 (*)    All other components within normal limits  CBG MONITORING, ED - Abnormal; Notable for the following components:   Glucose-Capillary 100 (*)    All other components within normal limits  CBG MONITORING, ED - Abnormal; Notable for the following components:   Glucose-Capillary 108 (*)    All other components within normal limits  TROPONIN T, HIGH SENSITIVITY - Abnormal; Notable for the following components:   Troponin T High Sensitivity 70 (*)    All other components within normal limits  RESP PANEL BY RT-PCR (RSV, FLU A&B, COVID)  RVPGX2  LIPASE, BLOOD  HIV ANTIBODY (ROUTINE TESTING W REFLEX)  HEPATITIS B SURFACE ANTIGEN    EKG EKG Interpretation Date/Time:  Wednesday April 17 2024 14:03:52 EST Ventricular Rate:  99 PR Interval:  163 QRS Duration:  80 QT Interval:  399 QTC Calculation: 513 R Axis:   56  Text Interpretation: Sinus tachycardia Atrial premature complex Borderline repolarization abnormality new Prolonged QT interval Confirmed by Doretha Folks ([id number]) on 04/17/2024 5:21:20  PM  Radiology CTA CAP: Pending  Procedures .Critical Care  Performed by: Franklyn Sid SAILOR, MD Authorized by: Franklyn Sid SAILOR, MD   Critical care provider statement:    Critical care time (minutes):  30   Critical care was time spent personally by me on the following activities:  Development of treatment plan with patient or surrogate, evaluation of patient's response to treatment, examination of patient, ordering and review of laboratory studies, ordering and performing treatments and interventions, pulse oximetry, re-evaluation of patient's condition and review of old charts     Medications Ordered in ED Medications  metoCLOPramide  (REGLAN ) injection 10 mg (has no administration in  time range)  HYDROmorphone  (DILAUDID ) injection 0.5 mg (has no administration in time range)  hydrALAZINE  (APRESOLINE ) injection 5 mg (has no administration in time range)  lactated ringers  bolus 500 mL (has no administration in time range)    ED Course/ Medical Decision Making/ A&P                          Medical Decision Making Amount and/or Complexity of Data Reviewed Labs: ordered. Decision-making details documented in ED Course. Radiology: ordered.  Risk Prescription drug management. Decision regarding hospitalization.    This patient presents to the ED for concern of vomiting, chest/abdominal pain, this involves an extensive number of treatment options, and is a complaint that carries with it a high risk of complications and morbidity.  I considered the following differential and admission for this acute, potentially life threatening condition.   MDM:    DDX for this patient's nausea/vomiting includes but is not limited to:  Patient reports his sxs feel like his gastroparesis. Will give IV antiemetics. Also consider ACS, SBO, AAS or other vascular emergency such as mesenteric ischemia. GERD/PUD, viral gastroenteritis, hypertensive emergency, electrolyte derangements, DKA/HHS.  Reassuringly no significant hypo/hyperglycemia today. First troponin is 70, pending repeat.   Also w/ HTN. >220 systolic at times. Given 5 mg IV hydralazine  then 10 mg IV hydralazine , also anticipate this will improve with nausea/vomiting and pain control. Will reevaluate. Also consider hypertensive emergency on ddx. No focal neuro deficits at this time.    Clinical Course as of 04/18/24 1500  Wed Apr 17, 2024  1610 Troponin T High Sensitivity(!): 70 Pending repeat [HN]    Clinical Course User Index [HN] Franklyn Sid SAILOR, MD    Labs: I Ordered, and personally interpreted labs.  The pertinent results include:  those listed above  Imaging Studies ordered: I ordered imaging studies including CT dissection protocol I independently visualized and interpreted imaging. I agree with the radiologist interpretation  Additional history obtained from chart review, wife at bedside.    Cardiac Monitoring: The patient was maintained on a cardiac monitor.  I personally viewed and interpreted the cardiac monitored which showed an underlying rhythm of: sinus tachycardia  Reevaluation: After the interventions noted above, I reevaluated the patient and found that they have :stayed the same  Social Determinants of Health: Lives independently  Disposition: Patient is signed out to the oncoming ED physician Dr. Doretha who is made aware of her history, presentation, exam, workup, and plan.     Co morbidities that complicate the patient evaluation  Past Medical History:  Diagnosis Date   ESRD (end stage renal disease) (HCC)    Hypertension      Medicines Meds ordered this encounter  Medications   metoCLOPramide  (REGLAN ) injection 10 mg   HYDROmorphone  (DILAUDID ) injection 0.5 mg   hydrALAZINE  (APRESOLINE ) injection 5 mg   lactated ringers  bolus 500 mL    I have reviewed the patients home medicines and have made adjustments as needed  Problem List / ED Course: Problem List Items  Addressed This Visit       Cardiovascular and Mediastinum   Hypertension (Chronic)   Relevant Medications   amLODipine  (NORVASC ) tablet 10 mg   hydrALAZINE  (APRESOLINE ) tablet 25 mg   metoprolol  succinate (TOPROL -XL) 24 hr tablet 75 mg   spironolactone  (ALDACTONE ) tablet 25 mg   apixaban  (ELIQUIS ) tablet 5 mg   labetalol  (NORMODYNE ) injection 10 mg     Digestive   Gastroparesis - Primary  Other Visit Diagnoses       Persistent vomiting                       This note was created using dictation software, which may contain spelling or grammatical errors.    Franklyn Sid SAILOR, MD 04/20/24 1020  "

## 2024-04-17 NOTE — H&P (Incomplete)
 " History and Physical    [name] FMW:[id number] DOB: Apr 05, 1976 DOA: 04/17/2024  Patient coming from: Home.  Chief Complaint: Nausea vomiting.  HPI: [name] is a 48 y.o. male with history of ESRD on hemodialysis, hypertension, diabetes mellitus type 2 presently not on medication, nonischemic cardiomyopathy, hypothyroidism, atrial fibrillation presents to the ER because of intractable nausea vomiting ongoing for last 2 days following which patient also started developing some chest discomfort.  Denies any productive cough fever chills.  Patient states because of the vomiting he did not have his dialysis session which he usually does at home.  Denies diarrhea or abdominal discomfort.  ED Course: In the ER patient's blood pressures found to be markedly elevated with systolic of 240 on arrival.  Was given IV hydralazine  and also IV labetalol  following which blood pressure improved to the 180s and 190s.  CT angiogram chest abdomen pelvis does not show anything acute.  Lab work showed a troponin of 70 EKG shows normal sinus rhythm with nonspecific T wave changes.  Hemoglobin 10.7 platelets 126 potassium 4.6 sodium 134.  Patient admitted for hypertensive urgency with intractable nausea vomiting.  Review of Systems: As per HPI, rest all negative.   Past Medical History:  Diagnosis Date   ESRD (end stage renal disease) (HCC)    Hypertension     Past Surgical History:  Procedure Laterality Date   AV FISTULA PLACEMENT Right 2021   BIOPSY  12/22/2022   Procedure: BIOPSY;  Surgeon: Federico Rosario BROCKS, MD;  Location: [facility];  Service: Gastroenterology;;   COLONOSCOPY WITH PROPOFOL  N/A 12/22/2022   Procedure: COLONOSCOPY WITH PROPOFOL ;  Surgeon: Federico Rosario BROCKS, MD;  Location: [facility];  Service: Gastroenterology;  Laterality: N/A;   ESOPHAGOGASTRODUODENOSCOPY (EGD) WITH PROPOFOL  N/A 12/22/2022   Procedure: ESOPHAGOGASTRODUODENOSCOPY (EGD) WITH PROPOFOL ;  Surgeon: Federico Rosario BROCKS,  MD;  Location: [facility];  Service: Gastroenterology;  Laterality: N/A;   EYE SURGERY     POLYPECTOMY  12/22/2022   Procedure: POLYPECTOMY;  Surgeon: Federico Rosario BROCKS, MD;  Location: [facility];  Service: Gastroenterology;;     reports that he has never smoked. He has never used smokeless tobacco. He reports that he does not drink alcohol and does not use drugs.  Allergies[1]  Family History  Problem Relation Age of Onset   Diabetes Mother    Lung disease Father    Liver disease Neg Hx    Esophageal cancer Neg Hx    Colon cancer Neg Hx     Prior to Admission medications  Medication Sig Start Date End Date Taking? Authorizing Provider  acetaminophen  (TYLENOL ) 325 MG tablet Take 2 tablets (650 mg total) by mouth every 6 (six) hours as needed for mild pain (pain score 1-3) (or Fever >/= 101). 05/18/23   Sheikh, Omair Latif, DO  amLODipine  (NORVASC ) 10 MG tablet Take 1 tablet (10 mg total) by mouth daily. 12/20/23   Lucien Orren SAILOR, PA-C  apixaban  (ELIQUIS ) 5 MG TABS tablet Take 1 tablet (5 mg total) by mouth 2 (two) times daily. 01/04/24   Lucien Orren SAILOR, PA-C  calcium  acetate (PHOSLO) 667 MG capsule daily at 6 (six) AM. 07/19/23 07/05/24  [provider]  Cholecalciferol 50 MCG (2000 UT) CAPS Take 2,000 Units by mouth daily at 6 (six) AM. 05/12/23   [provider]  Cyanocobalamin  (VITAMIN B-12 PO) Take 1 tablet by mouth daily.    [provider]  doxazosin  (CARDURA ) 2 MG tablet Take 2 mg by mouth  at bedtime. 08/04/22   [provider]  ferric citrate  (AURYXIA ) 1 GM 210 MG(Fe) tablet Take 420 mg by mouth daily before breakfast.    [provider]  hydrALAZINE  (APRESOLINE ) 25 MG tablet Take 1 tablet (25 mg total) by mouth 3 (three) times daily. 12/20/23   Lucien Orren SAILOR, PA-C  levothyroxine  (SYNTHROID ) 25 MCG tablet Take 1 tablet (25 mcg total) by mouth daily at 6 (six) AM. 05/19/23   Sheikh, Omair Latif, DO  Methoxy PEG-Epoetin Beta (MIRCERA IJ)  Inject 150 mcg into the skin as directed. For dialysis 07/06/23   [provider]  metoCLOPramide  (REGLAN ) 5 MG tablet Take 1 tablet (5 mg total) by mouth every 6 (six) hours as needed for nausea or vomiting. 01/12/23   Jillian Buttery, MD  metoprolol  succinate (TOPROL -XL) 50 MG 24 hr tablet Take 1.5 tablets (75 mg total) by mouth daily. Take with or immediately following a meal. 12/20/23   Lucien Orren SAILOR, PA-C  pantoprazole  (PROTONIX ) 40 MG tablet Take 1 tablet (40 mg total) by mouth 2 (two) times daily before a meal. 12/22/22   Federico Rosario BROCKS, MD  sacubitril -valsartan  (ENTRESTO ) 24-26 MG Take 1 tablet by mouth 2 (two) times daily. 01/04/24   Lucien Orren SAILOR, PA-C  spironolactone  (ALDACTONE ) 25 MG tablet Take 1 tablet (25 mg total) by mouth daily. 12/20/23   Lucien Orren SAILOR, PA-C    Physical Exam: Constitutional: Moderately built and nourished. Vitals:   04/17/24 2000 04/17/24 2045 04/17/24 2120 04/17/24 2130  BP:   (!) 216/98 (!) 187/100  Pulse: (!) 104 98 (!) 109 (!) 106  Resp: 15 18 (!) 25 17  Temp:      TempSrc:      SpO2: 100% 96% 100% 92%  Weight:      Height:       Eyes: Anicteric no pallor. ENMT: No discharge from the ears eyes nose or mouth. Neck: No mass felt.  No neck rigidity. Respiratory: No rhonchi or crepitations. Cardiovascular: S1-S2 heard. Abdomen: Soft nontender bowel sound present. Musculoskeletal: No edema. Skin: No rash. Neurologic: Alert awake oriented to time place and person.  Moves all extremities. Psychiatric: Appears normal.  Normal affect.   Labs on Admission: I have personally reviewed following labs and imaging studies  CBC: Recent Labs  Lab 04/17/24 1451  WBC 6.2  NEUTROABS 5.1  HGB 10.7*  HCT 32.6*  MCV 95.0  PLT 126*   Basic Metabolic Panel: Recent Labs  Lab 04/17/24 1636  NA 134*  K 4.6  CL 97*  CO2 18*  GLUCOSE 127*  BUN 40*  CREATININE 11.10*  CALCIUM  8.4*   GFR: Estimated Creatinine Clearance: 9.3 mL/min (A) (by C-G  formula based on SCr of 11.1 mg/dL (H)). Liver Function Tests: Recent Labs  Lab 04/17/24 1636  AST 21  ALT 16  ALKPHOS 67  BILITOT 0.6  PROT 7.6  ALBUMIN 4.5   Recent Labs  Lab 04/17/24 1636  LIPASE 16   No results for input(s): AMMONIA in the last 168 hours. Coagulation Profile: No results for input(s): INR, PROTIME in the last 168 hours. Cardiac Enzymes: No results for input(s): CKTOTAL, CKMB, CKMBINDEX, TROPONINI in the last 168 hours. BNP (last 3 results) No results for input(s): PROBNP in the last 8760 hours. HbA1C: No results for input(s): HGBA1C in the last 72 hours. CBG: Recent Labs  Lab 04/17/24 1520 04/17/24 1534  GLUCAP 100* 108*   Lipid Profile: No results for input(s): CHOL, HDL, LDLCALC, TRIG, CHOLHDL,  LDLDIRECT in the last 72 hours. Thyroid  Function Tests: No results for input(s): TSH, T4TOTAL, FREET4, T3FREE, THYROIDAB in the last 72 hours. Anemia Panel: No results for input(s): VITAMINB12, FOLATE, FERRITIN, TIBC, IRON , RETICCTPCT in the last 72 hours. Urine analysis:    Component Value Date/Time   COLORURINE YELLOW 01/06/2023 1050   APPEARANCEUR CLEAR 01/06/2023 1050   LABSPEC 1.020 01/06/2023 1050   PHURINE 6.0 01/06/2023 1050   GLUCOSEU 250 (A) 01/06/2023 1050   HGBUR MODERATE (A) 01/06/2023 1050   BILIRUBINUR NEGATIVE 01/06/2023 1050   KETONESUR NEGATIVE 01/06/2023 1050   PROTEINUR >300 (A) 01/06/2023 1050   NITRITE NEGATIVE 01/06/2023 1050   LEUKOCYTESUR NEGATIVE 01/06/2023 1050   Sepsis Labs: @LABRCNTIP (procalcitonin:4,lacticidven:4) ) Recent Results (from the past 240 hours)  Resp panel by RT-PCR (RSV, Flu A&B, Covid) Anterior Nasal Swab     Status: None   Collection Time: 04/17/24  2:02 PM   Specimen: Anterior Nasal Swab  Result Value Ref Range Status   SARS Coronavirus 2 by RT PCR NEGATIVE NEGATIVE Final   Influenza A by PCR NEGATIVE NEGATIVE Final   Influenza B by PCR NEGATIVE  NEGATIVE Final    Comment: (NOTE) The Xpert Xpress SARS-CoV-2/FLU/RSV plus assay is intended as an aid in the diagnosis of influenza from Nasopharyngeal swab specimens and should not be used as a sole basis for treatment. Nasal washings and aspirates are unacceptable for Xpert Xpress SARS-CoV-2/FLU/RSV testing.  Fact Sheet for Patients: bloggercourse.com  Fact Sheet for Healthcare Providers: seriousbroker.it  This test is not yet approved or cleared by the [place]  FDA and has been authorized for detection and/or diagnosis of SARS-CoV-2 by FDA under an Emergency Use Authorization (EUA). This EUA will remain in effect (meaning this test can be used) for the duration of the COVID-19 declaration under Section 564(b)(1) of the Act, 21 U.S.C. section 360bbb-3(b)(1), unless the authorization is terminated or revoked.     Resp Syncytial Virus by PCR NEGATIVE NEGATIVE Final    Comment: (NOTE) Fact Sheet for Patients: bloggercourse.com  Fact Sheet for Healthcare Providers: seriousbroker.it  This test is not yet approved or cleared by the [place]  FDA and has been authorized for detection and/or diagnosis of SARS-CoV-2 by FDA under an Emergency Use Authorization (EUA). This EUA will remain in effect (meaning this test can be used) for the duration of the COVID-19 declaration under Section 564(b)(1) of the Act, 21 U.S.C. section 360bbb-3(b)(1), unless the authorization is terminated or revoked.  Performed at [facility], [address] [place], [place] [address]      Radiological Exams on Admission: CT Angio Chest/Abd/Pel for Dissection W and/or Wo Contrast Result Date: 04/17/2024 CLINICAL DATA:  Acute aortic syndrome suspected. EXAM: CT ANGIOGRAPHY CHEST, ABDOMEN AND PELVIS TECHNIQUE: Non-contrast CT of the chest was initially obtained. Multidetector CT imaging  through the chest, abdomen and pelvis was performed using the standard protocol during bolus administration of intravenous contrast. Multiplanar reconstructed images and MIPs were obtained and reviewed to evaluate the vascular anatomy. RADIATION DOSE REDUCTION: This exam was performed according to the departmental dose-optimization program which includes automated exposure control, adjustment of the mA and/or kV according to patient size and/or use of iterative reconstruction technique. CONTRAST:  OMNIPAQUE  IOHEXOL  350 MG/ML SOLN COMPARISON:  Chest radiograph dated 05/16/2023 and CT abdomen pelvis dated 01/05/2023. FINDINGS: CTA CHEST FINDINGS Cardiovascular: Borderline cardiomegaly. Small pericardial effusion. Coronary vascular calcification of the LAD. The thoracic aorta is unremarkable. The origins of the great vessels of the  aortic arch are patent. No pulmonary artery embolus identified. Mediastinum/Nodes: No hilar or mediastinal adenopathy. The esophagus is grossly unremarkable. No mediastinal fluid collection. Lungs/Pleura: No focal consolidation, pleural effusion, pneumothorax. The central airways are patent. Musculoskeletal: No chest wall abnormality. No acute or significant osseous findings. Review of the MIP images confirms the above findings. CTA ABDOMEN AND PELVIS FINDINGS VASCULAR Aorta: Mild atherosclerotic calcification of the abdominal aorta. Nodular dilatation or dissection. Celiac: Patent without evidence of aneurysm, dissection, vasculitis or significant stenosis. SMA: Patent without evidence of aneurysm, dissection, vasculitis or significant stenosis. Renals: Atherosclerotic calcification of the renal arteries. The renal arteries remain patent. IMA: Patent without evidence of aneurysm, dissection, vasculitis or significant stenosis. Inflow: Atherosclerotic calcification of the iliac arteries. No aneurysmal dilatation or dissection. The iliac arteries are patent. Veins: No obvious venous  abnormality within the limitations of this arterial phase study. Review of the MIP images confirms the above findings. NON-VASCULAR No intra-abdominal free air or free fluid. Hepatobiliary: Fatty liver. No biliary dilatation. Gallstones. No pericholecystic fluid or evidence of acute cholecystitis by CT. Pancreas: Unremarkable. No pancreatic ductal dilatation or surrounding inflammatory changes. Spleen: Normal in size without focal abnormality. Adrenals/Urinary Tract: The adrenal glands unremarkable. Moderate bilateral renal parenchyma atrophy. There is no hydronephrosis on either side. The visualized ureters and urinary bladder appear unremarkable. Stomach/Bowel: There is no bowel obstruction or active inflammation. The appendix is normal. Lymphatic: No adenopathy. Reproductive: The prostate and seminal vesicles are grossly unremarkable. The right testicle is not visualized. Other: None Musculoskeletal: No acute or significant osseous findings. Review of the MIP images confirms the above findings. IMPRESSION: 1. No acute intrathoracic, abdominal, or pelvic pathology. No aortic aneurysm or dissection. 2. Cholelithiasis. 3. Fatty liver. 4.  Aortic Atherosclerosis (ICD10-I70.0). Electronically Signed   By: Vanetta Chou M.D.   On: 04/17/2024 20:36    EKG: Independently reviewed.  Normal sinus rhythm with nonspecific T wave changes.  Assessment/Plan Principal Problem:   Nausea & vomiting Active Problems:   ESRD (end stage renal disease) (HCC)   Iron  deficiency anemia, unspecified   Hypertensive urgency   PAF (paroxysmal atrial fibrillation) (HCC)    Intractable nausea vomiting cause not clear.  Patient states he will try diet now that after getting antiemetics he is feeling better.  Patient also has gallbladder stones we will check for ultrasound abdomen.  Abdomen appears benign.  Since patient is also having headache will check CT head. Hypertensive urgency could be from not able to take his home  medications.  Restart his home dose of medications including amlodipine  spironolactone  beta-blockers hydralazine .  As needed IV labetalol  ordered.  Follow blood pressure trends. Elevated troponin with chest discomfort.  Chest discomfort could be from hypertensive urgency.  Presently chest pain-free.  Trend cardiac markers will consult cardiology. History of nonischemic cardiomyopathy on spironolactone  and beta-blockers. ESRD on hemodialysis does dialysis at home.  Consult nephrology. Paroxysmal atrial fibrillation presently in sinus rhythm.  On Eliquis  for anticoagulation and beta-blocker for rate control. Anemia likely from renal disease follow CBC. History of diabetes mellitus type 2 presently not on any medication. Hypothyroidism on Synthroid .  Since patient has hypertensive urgency with intractable nausea vomiting will need further workup and more than 2 midnight stay.  DVT prophylaxis: Eliquis . Code Status: Full code. Family Communication: Discussed with patient. Disposition Plan: Monitored bed. Consults called: Will need to consult nephrology for dialysis.  Consulted cardiology. Admission status: Observation.         [1]  Allergies Allergen Reactions  Ms Contin [Morphine] Anaphylaxis and Swelling    Full body swelling   Sulfa Antibiotics Swelling    Facial and eye swelling   "

## 2024-04-17 NOTE — ED Notes (Signed)
 Vitals due but PT in imaging, will get vitals once PT returns.[name]

## 2024-04-17 NOTE — ED Provider Notes (Signed)
 I assumed care from Dr. Franklyn at 5 PM.  Patient received CT scan to rule out dissection due to complaint of chest and abdominal pain as well as persistent vomiting.  Patient CT is negative for acute findings.  Does have cholelithiasis but does not have right upper quadrant pain on exam.  However patient continues to vomit despite several rounds of Reglan .  Will try Ativan  but he is denying any vertiginous symptoms at this time.  He does say when he lays down he does occasionally feel dizzy and in the past has had to have a scopolamine  patch.  Last time he reports he had anything like this was about a year ago.  He does dialyze at home and did a last full treatment on Monday.  He was planning on doing a treatment today but just kept vomiting.  Given patient's persistent emesis will admit for persistent nausea and vomiting.   Doretha Folks, MD 04/17/24 2055

## 2024-04-17 NOTE — ED Triage Notes (Signed)
 BIB EMS with n/v and was unable to do his home dialysis.  Reports it started yesterday.  CP after nausea vomiting.  Diaphoretic  CBG 142  BP 228/136

## 2024-04-18 ENCOUNTER — Observation Stay (HOSPITAL_COMMUNITY)

## 2024-04-18 DIAGNOSIS — K3184 Gastroparesis: Secondary | ICD-10-CM | POA: Diagnosis not present

## 2024-04-18 DIAGNOSIS — R112 Nausea with vomiting, unspecified: Principal | ICD-10-CM

## 2024-04-18 DIAGNOSIS — D509 Iron deficiency anemia, unspecified: Secondary | ICD-10-CM

## 2024-04-18 DIAGNOSIS — R1115 Cyclical vomiting syndrome unrelated to migraine: Secondary | ICD-10-CM

## 2024-04-18 DIAGNOSIS — N186 End stage renal disease: Secondary | ICD-10-CM | POA: Diagnosis not present

## 2024-04-18 DIAGNOSIS — I160 Hypertensive urgency: Secondary | ICD-10-CM | POA: Diagnosis not present

## 2024-04-18 DIAGNOSIS — R7989 Other specified abnormal findings of blood chemistry: Secondary | ICD-10-CM | POA: Diagnosis not present

## 2024-04-18 LAB — TROPONIN T, HIGH SENSITIVITY
Troponin T High Sensitivity: 90 ng/L — ABNORMAL HIGH (ref 0–19)
Troponin T High Sensitivity: 92 ng/L — ABNORMAL HIGH (ref 0–19)
Troponin T High Sensitivity: 87 ng/L — ABNORMAL HIGH (ref 0–19)

## 2024-04-18 LAB — CBC
HCT: 30.5 % — ABNORMAL LOW (ref 39.0–52.0)
Hemoglobin: 10 g/dL — ABNORMAL LOW (ref 13.0–17.0)
MCH: 31.6 pg (ref 26.0–34.0)
MCHC: 32.8 g/dL (ref 30.0–36.0)
MCV: 96.5 fL (ref 80.0–100.0)
Platelets: 200 K/uL (ref 150–400)
RBC: 3.16 MIL/uL — ABNORMAL LOW (ref 4.22–5.81)
RDW: 14.6 % (ref 11.5–15.5)
WBC: 8 K/uL (ref 4.0–10.5)
nRBC: 0.3 % — ABNORMAL HIGH (ref 0.0–0.2)

## 2024-04-18 LAB — COMPREHENSIVE METABOLIC PANEL WITH GFR
ALT: 14 U/L (ref 0–44)
AST: 14 U/L — ABNORMAL LOW (ref 15–41)
Albumin: 4 g/dL (ref 3.5–5.0)
Alkaline Phosphatase: 58 U/L (ref 38–126)
Anion gap: 17 — ABNORMAL HIGH (ref 5–15)
BUN: 47 mg/dL — ABNORMAL HIGH (ref 6–20)
CO2: 19 mmol/L — ABNORMAL LOW (ref 22–32)
Calcium: 8.1 mg/dL — ABNORMAL LOW (ref 8.9–10.3)
Chloride: 97 mmol/L — ABNORMAL LOW (ref 98–111)
Creatinine, Ser: 12.7 mg/dL — ABNORMAL HIGH (ref 0.61–1.24)
GFR, Estimated: 4 mL/min — ABNORMAL LOW
Glucose, Bld: 88 mg/dL (ref 70–99)
Potassium: 5.3 mmol/L — ABNORMAL HIGH (ref 3.5–5.1)
Sodium: 133 mmol/L — ABNORMAL LOW (ref 135–145)
Total Bilirubin: 0.5 mg/dL (ref 0.0–1.2)
Total Protein: 6.7 g/dL (ref 6.5–8.1)

## 2024-04-18 LAB — HEPATITIS B SURFACE ANTIGEN: Hepatitis B Surface Ag: NONREACTIVE

## 2024-04-18 LAB — HIV ANTIBODY (ROUTINE TESTING W REFLEX): HIV Screen 4th Generation wRfx: NONREACTIVE

## 2024-04-18 MED ORDER — CHLORHEXIDINE GLUCONATE CLOTH 2 % EX PADS: 6.0000 | MEDICATED_PAD | Freq: Every day | CUTANEOUS | Status: DC

## 2024-04-18 MED ORDER — ALUM & MAG HYDROXIDE-SIMETH 200-200-20 MG/5ML PO SUSP: 30.0000 mL | Freq: Once | ORAL | Status: DC

## 2024-04-18 MED ORDER — HYDROXYZINE HCL 25 MG PO TABS
25.0000 mg | ORAL_TABLET | Freq: Three times a day (TID) | ORAL | Status: DC | PRN
  Administered 2024-04-18 – 2024-04-19 (×2): 25 mg via ORAL
  Filled 2024-04-18 (×2): qty 1

## 2024-04-18 MED ORDER — ALUM & MAG HYDROXIDE-SIMETH 200-200-20 MG/5ML PO SUSP
15.0000 mL | Freq: Four times a day (QID) | ORAL | Status: DC | PRN
  Administered 2024-04-18: 15 mL via ORAL
  Filled 2024-04-18: qty 30

## 2024-04-18 MED ORDER — FENTANYL CITRATE (PF) 50 MCG/ML IJ SOSY: 25.0000 ug | PREFILLED_SYRINGE | INTRAMUSCULAR | Status: DC | PRN

## 2024-04-18 MED ORDER — PHENOL 1.4 % MT LIQD
1.0000 | OROMUCOSAL | Status: DC | PRN
  Administered 2024-04-18: 1 via OROMUCOSAL
  Filled 2024-04-18: qty 177

## 2024-04-18 MED ORDER — SACUBITRIL-VALSARTAN 24-26 MG PO TABS
1.0000 | ORAL_TABLET | Freq: Two times a day (BID) | ORAL | Status: DC
  Administered 2024-04-18 – 2024-04-19 (×2): 1 via ORAL
  Filled 2024-04-18 (×2): qty 1

## 2024-04-18 MED ORDER — DOXAZOSIN MESYLATE 2 MG PO TABS
2.0000 mg | ORAL_TABLET | Freq: Every day | ORAL | Status: DC
  Administered 2024-04-18: 2 mg via ORAL
  Filled 2024-04-18 (×2): qty 1

## 2024-04-18 MED ORDER — LIDOCAINE VISCOUS HCL 2 % MT SOLN: 15.0000 mL | Freq: Once | OROMUCOSAL | Status: DC

## 2024-04-18 NOTE — Progress Notes (Signed)
 Patient currently here with intractable nausea and vomiting x 2 days with unclear etiologies, markedly elevated blood pressure 240 on arrival.  Troponin 70-97.  Previous hospitalist had wanted cardiology consultation however discussed with Dr. Drusilla and he will evaluate patient and let us  know if we are needed.  Reasonable to continue to trend troponins.  Elevation in troponins likely secondary to hypertensive urgency with underlying ESRD.  Chest pain may be related to nausea/vomiting.  Please page us  back if consultation is indicated.

## 2024-04-18 NOTE — Care Management Obs Status (Signed)
 MEDICARE OBSERVATION STATUS NOTIFICATION   Patient Details  Name: [name] MRN: [id number] Date of Birth: 1975-09-28   Medicare Observation Status Notification Given:  Yes    Tom-Johnson, Harvest Muskrat, RN 04/18/2024, 3:20 PM
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
## 2024-04-18 NOTE — Progress Notes (Signed)
 [facility]  PROGRESS NOTE  [name] FMW:[id number] DOB: [birth year] DOA: 04/17/2024 PCP: Florencio Clap, MD   Brief HPI:   48 y.o. male with history of ESRD on hemodialysis, hypertension, diabetes mellitus type 2 presently not on medication, nonischemic cardiomyopathy, hypothyroidism, atrial fibrillation presents to the ER because of intractable nausea vomiting ongoing for last 2 days following which patient also started developing some chest discomfort.  Denies any productive cough fever chills.  Patient states because of the vomiting he did not have his dialysis session which he usually does at home.  Denies diarrhea or abdominal discomfort.      Assessment/Plan:    Intractable nausea and vomiting - Likely in setting of gastroparesis; as per patient he has history of gastroparesis - Will avoid Reglan  due to prolonged QTc - Continue Compazine , add hydroxyzine  as needed - Abdominal ultrasound showed gallbladder sludge versus nonshadowing stones.  No evidence of acute cholecystitis, bile duct obstruction.  Hypertensive urgency - Home medications restarted -Continue amlodipine , Cardura , hydralazine , metoprolol , Aldactone  -Will start patient back on Entresto  - Continue IV labetalol  as needed - CT head was negative for acute intracranial abnormality  Troponin elevation - Likely in setting of hypertensive urgency - Denies chest pain at this time - Troponin went up from 70 to 90 - Will continue to trend troponin - EKG showed no acute changes  History of nonischemic cardiomyopathy - Continue Aldactone , metoprolol  - Recently started back on Entresto  in September per cardiology - Will restart Entresto   ESRD on hemodialysis - Patient does dialysis at home - Will consult nephrology  Paroxysmal atrial fibrillation - Presently in normal sinus rhythm - Continue Eliquis  for anticoagulation - Continue metoprolol  for rate control  Hypothyroidism - Continue  Synthroid   Diabetes mellitus type 2 - Last A1c was 5.2 as of 01/07/2023 - Not on medications for diabetes.         DVT prophylaxis: Apixaban   Medications     amLODipine   10 mg Oral Daily   apixaban   5 mg Oral BID   hydrALAZINE   25 mg Oral TID   levothyroxine   25 mcg Oral Q0600   metoprolol  succinate  75 mg Oral Daily   spironolactone   25 mg Oral Daily     Data Reviewed:   CBG:  Recent Labs  Lab 04/17/24 1520 04/17/24 1534  GLUCAP 100* 108*    SpO2: 100 %    Vitals:   04/18/24 0344 04/18/24 0456 04/18/24 0819 04/18/24 1148  BP: (!) 171/92 (!) 169/87 (!) 185/89 (!) 179/88  Pulse: 87  88 90  Resp: 16  18 17   Temp: 99.7 F (37.6 C)  98.4 F (36.9 C) 98.4 F (36.9 C)  TempSrc: Oral  Oral Oral  SpO2: 100%  100% 100%  Weight:      Height:          Data Reviewed:  Basic Metabolic Panel: Recent Labs  Lab 04/17/24 1636 04/18/24 0506  NA 134* 133*  K 4.6 5.3*  CL 97* 97*  CO2 18* 19*  GLUCOSE 127* 88  BUN 40* 47*  CREATININE 11.10* 12.70*  CALCIUM  8.4* 8.1*    CBC: Recent Labs  Lab 04/17/24 1451 04/18/24 0506  WBC 6.2 8.0  NEUTROABS 5.1  --   HGB 10.7* 10.0*  HCT 32.6* 30.5*  MCV 95.0 96.5  PLT 126* 200    LFT Recent Labs  Lab 04/17/24 1636 04/18/24 0506  AST 21 14*  ALT 16 14  ALKPHOS 67 58  BILITOT 0.6  0.5  PROT 7.6 6.7  ALBUMIN 4.5 4.0     Antibiotics: Anti-infectives (From admission, onward)    None        CONSULTS nephrology  Code Status: Full code  Family Communication: No family at bedside     Subjective   Complain of nausea and vomiting.  Patient has history of gastroparesis   Objective    Physical Examination:  General-appears in no acute distress Heart-S1-S2, regular, no murmur auscultated Lungs-clear to auscultation bilaterally, no wheezing or crackles auscultated Abdomen-soft, nontender, no organomegaly Extremities-no edema in the lower extremities Neuro-alert, oriented x3, no focal  deficit noted            Jeniah Kishi S Demetra Moya   [facility] If 7PM-7AM, please contact night-coverage at [email or web address], Office  [phone]   04/18/2024, 3:15 PM  LOS: 0 days

## 2024-04-18 NOTE — Plan of Care (Signed)

## 2024-04-18 NOTE — Plan of Care (Signed)
  Problem: Education: Goal: Knowledge of General Education information will improve Description: Including pain rating scale, medication(s)/side effects and non-pharmacologic comfort measures Outcome: Progressing   Problem: Activity: Goal: Risk for activity intolerance will decrease Outcome: Not Progressing   Problem: Nutrition: Goal: Adequate nutrition will be maintained Outcome: Progressing

## 2024-04-19 ENCOUNTER — Other Ambulatory Visit (HOSPITAL_COMMUNITY): Payer: Self-pay

## 2024-04-19 DIAGNOSIS — R112 Nausea with vomiting, unspecified: Secondary | ICD-10-CM | POA: Diagnosis not present

## 2024-04-19 DIAGNOSIS — R1115 Cyclical vomiting syndrome unrelated to migraine: Secondary | ICD-10-CM | POA: Diagnosis not present

## 2024-04-19 DIAGNOSIS — N186 End stage renal disease: Secondary | ICD-10-CM | POA: Diagnosis not present

## 2024-04-19 DIAGNOSIS — R7989 Other specified abnormal findings of blood chemistry: Secondary | ICD-10-CM | POA: Diagnosis not present

## 2024-04-19 MED ORDER — HYDRALAZINE HCL 50 MG PO TABS
50.0000 mg | ORAL_TABLET | Freq: Three times a day (TID) | ORAL | 1 refills | Status: AC
  Filled 2024-04-19: qty 90, 30d supply, fill #0

## 2024-04-19 MED ORDER — PROCHLORPERAZINE EDISYLATE 10 MG/2ML IJ SOLN
5.0000 mg | Freq: Four times a day (QID) | INTRAMUSCULAR | Status: DC | PRN
  Administered 2024-04-19: 5 mg via INTRAVENOUS
  Filled 2024-04-19: qty 2

## 2024-04-19 MED ORDER — CHLORHEXIDINE GLUCONATE CLOTH 2 % EX PADS
6.0000 | MEDICATED_PAD | Freq: Every day | CUTANEOUS | Status: DC
  Administered 2024-04-19: 6 via TOPICAL

## 2024-04-19 MED ORDER — HYDRALAZINE HCL 50 MG PO TABS: 50.0000 mg | ORAL_TABLET | Freq: Three times a day (TID) | ORAL | Status: DC

## 2024-04-19 MED ORDER — HYDROXYZINE HCL 25 MG PO TABS
25.0000 mg | ORAL_TABLET | Freq: Three times a day (TID) | ORAL | 0 refills | Status: AC | PRN
  Filled 2024-04-19: qty 15, 5d supply, fill #0

## 2024-04-19 NOTE — Progress Notes (Signed)
 [facility] NEPHROLOGY PROGRESS NOTE  Assessment/ Plan: Pt is a 48 y.o. yo male  with a PMH sig for HTN, HLD, DM II, nonischemic cardiomyopathy, and ESRD on HHD 4x/ week, seen as a consultation for the management of ESRD.  Home HD using [facility]Stage with PureFlow 4X Week, MonTueThuFri, Estimated Treatment Time: 3 hrs 10 min, CAR 172, Therapy Fluid 1.0 K 40 Lactate, Volume per Tx 4 (liters), Max FF: 200 %, BFR: 400, DFR: 12.6 L/hr, Ordered Max UFR: 8 mL/Kg/hr, EDW 92 (kg), Access: AVFistula-Standard, Clinic 3350-   # ESRD on HHD: Completed dialysis this morning with UF 3 L.  May do another dialysis tomorrow which can be done at home if he is discharged.    # Anemia: Hemoglobin 10, continue to monitor.  # Hypertension/volume: UF with HD.  Currently on Aldactone , Entresto , metoprolol , hydralazine  and adding amlodipine .  Monitor BP.  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  # Hyponatremia hypervolemic: UF with HD, minimize fluid intake.  Discussed with the primary team.  Subjective: Seen and examined bedside.  Patient reported feeling better with HD.  Denies nausea, vomiting, chest pain or shortness of breath. Objective Vital signs in last 24 hours: Vitals:   04/19/24 0345 04/19/24 0400 04/19/24 0450 04/19/24 0807  BP: (!) 184/97 (!) 190/94 (!) 183/95 (!) 173/81  Pulse: 88 91 95 92  Resp: 17 17 20 18   Temp:  98.5 F (36.9 C) 98.7 F (37.1 C) 98.9 F (37.2 C)  TempSrc:  Oral Oral Oral  SpO2: 100% 100% 99% 100%  Weight:      Height:       Weight change:   Intake/Output Summary (Last 24 hours) at 04/19/2024 1044 Last data filed at 04/19/2024 0400 Gross per 24 hour  Intake --  Output 3000 ml  Net -3000 ml       Labs: RENAL PANEL Recent Labs  Lab 04/17/24 1636 04/18/24 0506  NA 134* 133*  K 4.6 5.3*  CL 97* 97*  CO2 18* 19*  GLUCOSE 127* 88  BUN 40* 47*  CREATININE 11.10* 12.70*  CALCIUM  8.4* 8.1*  ALBUMIN 4.5 4.0    Liver Function  Tests: Recent Labs  Lab 04/17/24 1636 04/18/24 0506  AST 21 14*  ALT 16 14  ALKPHOS 67 58  BILITOT 0.6 0.5  PROT 7.6 6.7  ALBUMIN 4.5 4.0   Recent Labs  Lab 04/17/24 1636  LIPASE 16   No results for input(s): AMMONIA in the last 168 hours. CBC: Recent Labs    05/17/23 0023 05/17/23 0340 05/18/23 0727 04/17/24 1451 04/18/24 0506  HGB 11.6* 9.9* 11.7* 10.7* 10.0*  MCV  --  91.8 90.0 95.0 96.5    Cardiac Enzymes: No results for input(s): CKTOTAL, CKMB, CKMBINDEX, TROPONINI in the last 168 hours. CBG: Recent Labs  Lab 04/17/24 1520 04/17/24 1534  GLUCAP 100* 108*    Iron  Studies: No results for input(s): IRON , TIBC, TRANSFERRIN, FERRITIN in the last 72 hours. Studies/Results: US  Abdomen Limited RUQ (LIVER/GB) Result Date: 04/18/2024 EXAM: Right Upper Quadrant Abdominal Ultrasound 04/18/2024 07:41:08 AM TECHNIQUE: Real-time ultrasonography of the right upper quadrant of the abdomen was performed. COMPARISON: CTA of the chest abdomen and pelvis 04/17/2024. CLINICAL HISTORY: 48 year old male with pain. FINDINGS: LIVER: Normal echogenicity. No intrahepatic biliary ductal dilatation. No evidence of mass. Hepatopetal flow in the portal vein. BILIARY SYSTEM: Gallbladder wall thickness measures 3 mm. Dependent echogenic sludge versus stones without significant shadowing (images 7 and 14), in an area of 10  to 20 mm length. No pericholecystic fluid. No sonographic Beverley sign is elicited. The common bile duct is nondilated and measures 4 mm. RIGHT KIDNEY: By ultrasound the right kidney appears echogenic, partially atrophied (series 1 image 78). OTHER: No right upper quadrant ascites. IMPRESSION: 1. Gallbladder sludge vs non-shadowing stones. No US  evidence of acute cholecystitis, bile duct obstruction. 2. Echogenic, partially atrophic right kidney. Electronically signed by: Helayne Hurst MD 04/18/2024 09:08 AM EST RP Workstation: [id number]   CT HEAD WO CONTRAST  ( ) Result Date: 04/18/2024 EXAM: CT HEAD WITHOUT CONTRAST 04/18/2024 08:56:00 AM TECHNIQUE: CT of the head was performed without the administration of intravenous contrast. Automated exposure control, iterative reconstruction, and/or weight based adjustment of the mA/kV was utilized to reduce the radiation dose to as low as reasonably achievable. COMPARISON: Head CT 01/08/2023. CLINICAL HISTORY: 48 year old male dialysis patient with chest pain, nausea, vomiting, diaphoresis, headache, and hypertension. FINDINGS: There is residual intravascular contrast, probably related to CTA chest done yesterday. BRAIN AND VENTRICLES: Brain volume remains normal. No acute hemorrhage. No evidence of acute infarct. No hydrocephalus. No extra-axial collection. No mass effect or midline shift. Gray white differentiation is stable and within normal limits. No delayed abnormal brain enhancement is identified. ORBITS: No acute abnormality. SINUSES: Paranasal sinuses, tympanic cavities and mastoids are well aerated. SOFT TISSUES AND SKULL: Advanced scalp vessel calcified atherosclerosis. Calcified atherosclerosis at the skull base. No acute soft tissue abnormality. No skull fracture. IMPRESSION: 1. Negative CT appearance of the brain; intravascular residual contrast likely from CTA yesterday. Electronically signed by: Helayne Hurst MD 04/18/2024 09:04 AM EST RP Workstation: [id number]   CT Angio Chest/Abd/Pel for Dissection W and/or Wo Contrast Result Date: 04/17/2024 CLINICAL DATA:  Acute aortic syndrome suspected. EXAM: CT ANGIOGRAPHY CHEST, ABDOMEN AND PELVIS TECHNIQUE: Non-contrast CT of the chest was initially obtained. Multidetector CT imaging through the chest, abdomen and pelvis was performed using the standard protocol during bolus administration of intravenous contrast. Multiplanar reconstructed images and MIPs were obtained and reviewed to evaluate the vascular anatomy. RADIATION DOSE REDUCTION: This exam was performed  according to the departmental dose-optimization program which includes automated exposure control, adjustment of the mA and/or kV according to patient size and/or use of iterative reconstruction technique. CONTRAST:  OMNIPAQUE  IOHEXOL  350 MG/ML SOLN COMPARISON:  Chest radiograph dated 05/16/2023 and CT abdomen pelvis dated 01/05/2023. FINDINGS: CTA CHEST FINDINGS Cardiovascular: Borderline cardiomegaly. Small pericardial effusion. Coronary vascular calcification of the LAD. The thoracic aorta is unremarkable. The origins of the great vessels of the aortic arch are patent. No pulmonary artery embolus identified. Mediastinum/Nodes: No hilar or mediastinal adenopathy. The esophagus is grossly unremarkable. No mediastinal fluid collection. Lungs/Pleura: No focal consolidation, pleural effusion, pneumothorax. The central airways are patent. Musculoskeletal: No chest wall abnormality. No acute or significant osseous findings. Review of the MIP images confirms the above findings. CTA ABDOMEN AND PELVIS FINDINGS VASCULAR Aorta: Mild atherosclerotic calcification of the abdominal aorta. Nodular dilatation or dissection. Celiac: Patent without evidence of aneurysm, dissection, vasculitis or significant stenosis. SMA: Patent without evidence of aneurysm, dissection, vasculitis or significant stenosis. Renals: Atherosclerotic calcification of the renal arteries. The renal arteries remain patent. IMA: Patent without evidence of aneurysm, dissection, vasculitis or significant stenosis. Inflow: Atherosclerotic calcification of the iliac arteries. No aneurysmal dilatation or dissection. The iliac arteries are patent. Veins: No obvious venous abnormality within the limitations of this arterial phase study. Review of the MIP images confirms the above findings. NON-VASCULAR No intra-abdominal free air or free fluid. Hepatobiliary: Fatty liver.  No biliary dilatation. Gallstones. No pericholecystic fluid or evidence of acute  cholecystitis by CT. Pancreas: Unremarkable. No pancreatic ductal dilatation or surrounding inflammatory changes. Spleen: Normal in size without focal abnormality. Adrenals/Urinary Tract: The adrenal glands unremarkable. Moderate bilateral renal parenchyma atrophy. There is no hydronephrosis on either side. The visualized ureters and urinary bladder appear unremarkable. Stomach/Bowel: There is no bowel obstruction or active inflammation. The appendix is normal. Lymphatic: No adenopathy. Reproductive: The prostate and seminal vesicles are grossly unremarkable. The right testicle is not visualized. Other: None Musculoskeletal: No acute or significant osseous findings. Review of the MIP images confirms the above findings. IMPRESSION: 1. No acute intrathoracic, abdominal, or pelvic pathology. No aortic aneurysm or dissection. 2. Cholelithiasis. 3. Fatty liver. 4.  Aortic Atherosclerosis (ICD10-I70.0). Electronically Signed   By: Vanetta Chou M.D.   On: 04/17/2024 20:36    Medications: Infusions:   Scheduled Medications:  amLODipine   10 mg Oral Daily   apixaban   5 mg Oral BID   Chlorhexidine  Gluconate Cloth  6 each Topical Q0600   doxazosin   2 mg Oral QHS   hydrALAZINE   25 mg Oral TID   levothyroxine   25 mcg Oral Q0600   metoprolol  succinate  75 mg Oral Daily   sacubitril -valsartan   1 tablet Oral BID   spironolactone   25 mg Oral Daily    have reviewed scheduled and prn medications.  Physical Exam: General:NAD, comfortable Heart:RRR, s1s2 nl Lungs:clear b/l, no crackle Abdomen:soft, Non-tender, non-distended Extremities:No edema Dialysis Access: AV fistula has good thrill.  Blu Lori Prasad Nadene Witherspoon 04/19/2024,10:44 AM  LOS: 0 days

## 2024-04-19 NOTE — Progress Notes (Signed)
 D/c orders noted. Contacted out-pt HD clinic [facility] to inform he will be discharged. Noted that pt was dialyzed on day of discharge.   Dinna Severs Dialysis navigator [phone]

## 2024-04-19 NOTE — Discharge Summary (Signed)
 " Physician Discharge Summary   Patient: [name] MRN: [id number] DOB: 1975/05/28  Admit date:     04/17/2024  Discharge date: 04/19/2024  Discharge Physician: [name]   PCP: Tim Clap, MD   Recommendations at discharge:   Follow-up PCP in 2 weeks   Discharge Diagnoses: Principal Problem:   Nausea & vomiting Active Problems:   ESRD (end stage renal disease) (HCC)   Elevated troponin   Iron  deficiency anemia, unspecified   Hypertensive urgency   PAF (paroxysmal atrial fibrillation) (HCC)  Resolved Problems:   * No resolved hospital problems. *  Hospital Course: 48 y.o. male with history of ESRD on hemodialysis, hypertension, diabetes mellitus type 2 presently not on medication, nonischemic cardiomyopathy, hypothyroidism, atrial fibrillation presents to the ER because of intractable nausea vomiting ongoing for last 2 days following which patient also started developing some chest discomfort.  Denies any productive cough fever chills.  Patient states because of the vomiting he did not have his dialysis session which he usually does at home.  Denies diarrhea or abdominal discomfort.    Assessment and Plan:  Intractable nausea and vomiting -Resolved - Likely in setting of gastroparesis; as per patient he has history of gastroparesis - Will avoid Reglan  due to prolonged QTc - Continue Compazine , add hydroxyzine  as needed - Abdominal ultrasound showed gallbladder sludge versus nonshadowing stones.  No evidence of acute cholecystitis, bile duct obstruction. -Will discharge home hydroxyzine  25 mg 3 times daily as needed for nausea and vomiting   Hypertensive urgency - Home medications restarted -Continue amlodipine , Cardura , hydralazine , metoprolol , Aldactone  - Started back on Entresto  - CT head was negative for acute intracranial abnormality - Will increase dose of hydralazine  to 50 mg p.o. 3 times daily   Troponin elevation -No chest pain - Likely in setting  of hypertensive urgency - Denies chest pain at this time - Troponin went up from 70 to 90; trended down to 87 - Will continue to trend troponin - EKG showed no acute changes No further cardiac workup   History of nonischemic cardiomyopathy - Continue Aldactone , metoprolol  - Recently started back on Entresto  in September per cardiology - Will restart Entresto    ESRD on hemodialysis - Patient does dialysis at home - Nephrology consulted, underwent hemodialysis this morning   Paroxysmal atrial fibrillation - Presently in normal sinus rhythm - Continue Eliquis  for anticoagulation - Continue metoprolol  for rate control   Hypothyroidism - Continue Synthroid    Diabetes mellitus type 2 - Last A1c was 5.2 as of 01/07/2023 - Not on medications for diabetes.           Consultants: Nephrology Procedures performed:   Disposition: Home Diet recommendation:  Regular diet DISCHARGE MEDICATION: Allergies as of 04/19/2024       Reactions   Ms Contin [morphine] Anaphylaxis, Swelling   Full body swelling   Sulfa Antibiotics Swelling   Facial and eye swelling        Medication List     TAKE these medications    acetaminophen  500 MG tablet Commonly known as: TYLENOL  Take 1,000 mg by mouth every 6 (six) hours as needed for mild pain (pain score 1-3), moderate pain (pain score 4-6) or headache.   amLODipine  10 MG tablet Commonly known as: NORVASC  Take 1 tablet (10 mg total) by mouth daily.   apixaban  5 MG Tabs tablet Commonly known as: ELIQUIS  Take 1 tablet (5 mg total) by mouth 2 (two) times daily.   calcium  acetate 667 MG capsule Commonly  known as: PHOSLO Take 1,334 mg by mouth 2 (two) times daily with a meal.   Cholecalciferol 50 MCG (2000 UT) Caps Take 2,000 Units by mouth in the morning.   Cyanocobalamin  1000 MCG Chew Chew 1 each by mouth in the morning.   doxazosin  2 MG tablet Commonly known as: CARDURA  Take 2 mg by mouth at bedtime.   ferric citrate  1  GM 210 MG(Fe) tablet Commonly known as: AURYXIA  Take 420 mg by mouth 4 (four) times a week.   hydrALAZINE  50 MG tablet Commonly known as: APRESOLINE  Take 1 tablet (50 mg total) by mouth 3 (three) times daily. What changed:  medication strength how much to take   hydrOXYzine  25 MG tablet Commonly known as: ATARAX  Take 1 tablet (25 mg total) by mouth every 8 (eight) hours as needed for nausea.   Icy Hot Advanced Pain Relief 16-11 % Crea Generic drug: Menthol-Camphor Apply 1 application  topically as needed (Shoulder joint and muscle pain).   levothyroxine  50 MCG tablet Commonly known as: SYNTHROID  Take 50 mcg by mouth daily.   metoCLOPramide  5 MG tablet Commonly known as: REGLAN  Take 1 tablet (5 mg total) by mouth every 6 (six) hours as needed for nausea or vomiting.   metoprolol  succinate 50 MG 24 hr tablet Commonly known as: TOPROL -XL Take 1.5 tablets (75 mg total) by mouth daily. Take with or immediately following a meal. What changed:  how much to take when to take this   pantoprazole  40 MG tablet Commonly known as: PROTONIX  Take 1 tablet (40 mg total) by mouth 2 (two) times daily before a meal. What changed: when to take this   sacubitril -valsartan  24-26 MG Commonly known as: ENTRESTO  Take 1 tablet by mouth 2 (two) times daily.   spironolactone  25 MG tablet Commonly known as: ALDACTONE  Take 1 tablet (25 mg total) by mouth daily.   Velphoro 500 MG chewable tablet Generic drug: sucroferric oxyhydroxide Chew 1 tablet by mouth 3 (three) times daily with meals.        Follow-up Information     Tim Clap, MD Follow up in 2 week(s).   Specialty: Nephrology Contact information: [address] [place] [address] [phone]                Discharge Exam: [name]   04/17/24 1401  Weight: 96.7 kg   General-appears in no acute distress Heart-S1-S2, regular, no murmur auscultated Lungs-clear to auscultation bilaterally, no wheezing  or crackles auscultated Abdomen-soft, nontender, no organomegaly Extremities-no edema in the lower extremities Neuro-alert, oriented x3, no focal deficit noted  Condition at discharge: good  The results of significant diagnostics from this hospitalization (including imaging, microbiology, ancillary and laboratory) are listed below for reference.   Imaging Studies: US  Abdomen Limited RUQ (LIVER/GB) Result Date: 04/18/2024 EXAM: Right Upper Quadrant Abdominal Ultrasound 04/18/2024 07:41:08 AM TECHNIQUE: Real-time ultrasonography of the right upper quadrant of the abdomen was performed. COMPARISON: CTA of the chest abdomen and pelvis 04/17/2024. CLINICAL HISTORY: 48 year old male with pain. FINDINGS: LIVER: Normal echogenicity. No intrahepatic biliary ductal dilatation. No evidence of mass. Hepatopetal flow in the portal vein. BILIARY SYSTEM: Gallbladder wall thickness measures 3 mm. Dependent echogenic sludge versus stones without significant shadowing (images 7 and 14), in an area of 10 to 20 mm length. No pericholecystic fluid. No sonographic Beverley sign is elicited. The common bile duct is nondilated and measures 4 mm. RIGHT KIDNEY: By ultrasound the right kidney appears echogenic, partially atrophied (series 1 image 78).  OTHER: No right upper quadrant ascites. IMPRESSION: 1. Gallbladder sludge vs non-shadowing stones. No US  evidence of acute cholecystitis, bile duct obstruction. 2. Echogenic, partially atrophic right kidney. Electronically signed by: Helayne Hurst MD 04/18/2024 09:08 AM EST RP Workstation: [id number]   CT HEAD WO CONTRAST ( ) Result Date: 04/18/2024 EXAM: CT HEAD WITHOUT CONTRAST 04/18/2024 08:56:00 AM TECHNIQUE: CT of the head was performed without the administration of intravenous contrast. Automated exposure control, iterative reconstruction, and/or weight based adjustment of the mA/kV was utilized to reduce the radiation dose to as low as reasonably achievable. COMPARISON: Head  CT 01/08/2023. CLINICAL HISTORY: 48 year old male dialysis patient with chest pain, nausea, vomiting, diaphoresis, headache, and hypertension. FINDINGS: There is residual intravascular contrast, probably related to CTA chest done yesterday. BRAIN AND VENTRICLES: Brain volume remains normal. No acute hemorrhage. No evidence of acute infarct. No hydrocephalus. No extra-axial collection. No mass effect or midline shift. Gray white differentiation is stable and within normal limits. No delayed abnormal brain enhancement is identified. ORBITS: No acute abnormality. SINUSES: Paranasal sinuses, tympanic cavities and mastoids are well aerated. SOFT TISSUES AND SKULL: Advanced scalp vessel calcified atherosclerosis. Calcified atherosclerosis at the skull base. No acute soft tissue abnormality. No skull fracture. IMPRESSION: 1. Negative CT appearance of the brain; intravascular residual contrast likely from CTA yesterday. Electronically signed by: Helayne Hurst MD 04/18/2024 09:04 AM EST RP Workstation: [id number]   CT Angio Chest/Abd/Pel for Dissection W and/or Wo Contrast Result Date: 04/17/2024 CLINICAL DATA:  Acute aortic syndrome suspected. EXAM: CT ANGIOGRAPHY CHEST, ABDOMEN AND PELVIS TECHNIQUE: Non-contrast CT of the chest was initially obtained. Multidetector CT imaging through the chest, abdomen and pelvis was performed using the standard protocol during bolus administration of intravenous contrast. Multiplanar reconstructed images and MIPs were obtained and reviewed to evaluate the vascular anatomy. RADIATION DOSE REDUCTION: This exam was performed according to the departmental dose-optimization program which includes automated exposure control, adjustment of the mA and/or kV according to patient size and/or use of iterative reconstruction technique. CONTRAST:  OMNIPAQUE  IOHEXOL  350 MG/ML SOLN COMPARISON:  Chest radiograph dated 05/16/2023 and CT abdomen pelvis dated 01/05/2023. FINDINGS: CTA CHEST FINDINGS  Cardiovascular: Borderline cardiomegaly. Small pericardial effusion. Coronary vascular calcification of the LAD. The thoracic aorta is unremarkable. The origins of the great vessels of the aortic arch are patent. No pulmonary artery embolus identified. Mediastinum/Nodes: No hilar or mediastinal adenopathy. The esophagus is grossly unremarkable. No mediastinal fluid collection. Lungs/Pleura: No focal consolidation, pleural effusion, pneumothorax. The central airways are patent. Musculoskeletal: No chest wall abnormality. No acute or significant osseous findings. Review of the MIP images confirms the above findings. CTA ABDOMEN AND PELVIS FINDINGS VASCULAR Aorta: Mild atherosclerotic calcification of the abdominal aorta. Nodular dilatation or dissection. Celiac: Patent without evidence of aneurysm, dissection, vasculitis or significant stenosis. SMA: Patent without evidence of aneurysm, dissection, vasculitis or significant stenosis. Renals: Atherosclerotic calcification of the renal arteries. The renal arteries remain patent. IMA: Patent without evidence of aneurysm, dissection, vasculitis or significant stenosis. Inflow: Atherosclerotic calcification of the iliac arteries. No aneurysmal dilatation or dissection. The iliac arteries are patent. Veins: No obvious venous abnormality within the limitations of this arterial phase study. Review of the MIP images confirms the above findings. NON-VASCULAR No intra-abdominal free air or free fluid. Hepatobiliary: Fatty liver. No biliary dilatation. Gallstones. No pericholecystic fluid or evidence of acute cholecystitis by CT. Pancreas: Unremarkable. No pancreatic ductal dilatation or surrounding inflammatory changes. Spleen: Normal in size without focal abnormality. Adrenals/Urinary Tract: The adrenal glands unremarkable. Moderate  bilateral renal parenchyma atrophy. There is no hydronephrosis on either side. The visualized ureters and urinary bladder appear unremarkable.  Stomach/Bowel: There is no bowel obstruction or active inflammation. The appendix is normal. Lymphatic: No adenopathy. Reproductive: The prostate and seminal vesicles are grossly unremarkable. The right testicle is not visualized. Other: None Musculoskeletal: No acute or significant osseous findings. Review of the MIP images confirms the above findings. IMPRESSION: 1. No acute intrathoracic, abdominal, or pelvic pathology. No aortic aneurysm or dissection. 2. Cholelithiasis. 3. Fatty liver. 4.  Aortic Atherosclerosis (ICD10-I70.0). Electronically Signed   By: Vanetta Chou M.D.   On: 04/17/2024 20:36    Microbiology: Results for orders placed or performed during the hospital encounter of 04/17/24  Resp panel by RT-PCR (RSV, Flu A&B, Covid) Anterior Nasal Swab     Status: None   Collection Time: 04/17/24  2:02 PM   Specimen: Anterior Nasal Swab  Result Value Ref Range Status   SARS Coronavirus 2 by RT PCR NEGATIVE NEGATIVE Final   Influenza A by PCR NEGATIVE NEGATIVE Final   Influenza B by PCR NEGATIVE NEGATIVE Final    Comment: (NOTE) The Xpert Xpress SARS-CoV-2/FLU/RSV plus assay is intended as an aid in the diagnosis of influenza from Nasopharyngeal swab specimens and should not be used as a sole basis for treatment. Nasal washings and aspirates are unacceptable for Xpert Xpress SARS-CoV-2/FLU/RSV testing.  Fact Sheet for Patients: bloggercourse.com  Fact Sheet for Healthcare Providers: seriousbroker.it  This test is not yet approved or cleared by the [place]  FDA and has been authorized for detection and/or diagnosis of SARS-CoV-2 by FDA under an Emergency Use Authorization (EUA). This EUA will remain in effect (meaning this test can be used) for the duration of the COVID-19 declaration under Section 564(b)(1) of the Act, 21 U.S.C. section 360bbb-3(b)(1), unless the authorization is terminated or revoked.     Resp Syncytial  Virus by PCR NEGATIVE NEGATIVE Final    Comment: (NOTE) Fact Sheet for Patients: bloggercourse.com  Fact Sheet for Healthcare Providers: seriousbroker.it  This test is not yet approved or cleared by the [place]  FDA and has been authorized for detection and/or diagnosis of SARS-CoV-2 by FDA under an Emergency Use Authorization (EUA). This EUA will remain in effect (meaning this test can be used) for the duration of the COVID-19 declaration under Section 564(b)(1) of the Act, 21 U.S.C. section 360bbb-3(b)(1), unless the authorization is terminated or revoked.  Performed at [facility], [address] [place], [place] [address]     Labs: CBC: Recent Labs  Lab 04/17/24 1451 04/18/24 0506  WBC 6.2 8.0  NEUTROABS 5.1  --   HGB 10.7* 10.0*  HCT 32.6* 30.5*  MCV 95.0 96.5  PLT 126* 200   Basic Metabolic Panel: Recent Labs  Lab 04/17/24 1636 04/18/24 0506  NA 134* 133*  K 4.6 5.3*  CL 97* 97*  CO2 18* 19*  GLUCOSE 127* 88  BUN 40* 47*  CREATININE 11.10* 12.70*  CALCIUM  8.4* 8.1*   Liver Function Tests: Recent Labs  Lab 04/17/24 1636 04/18/24 0506  AST 21 14*  ALT 16 14  ALKPHOS 67 58  BILITOT 0.6 0.5  PROT 7.6 6.7  ALBUMIN 4.5 4.0   CBG: Recent Labs  Lab 04/17/24 1520 04/17/24 1534  GLUCAP 100* 108*    Discharge time spent: greater than 30 minutes.  Signed: Sabas GORMAN Brod, MD [facility] 04/19/2024 "

## 2024-04-19 NOTE — Progress Notes (Incomplete)
 [facility]  PROGRESS NOTE  [name] FMW:[id number] DOB: 11-06-1975 DOA: 04/17/2024 PCP: Florencio Clap, MD   Brief HPI:   48 y.o. male with history of ESRD on hemodialysis, hypertension, diabetes mellitus type 2 presently not on medication, nonischemic cardiomyopathy, hypothyroidism, atrial fibrillation presents to the ER because of intractable nausea vomiting ongoing for last 2 days following which patient also started developing some chest discomfort.  Denies any productive cough fever chills.  Patient states because of the vomiting he did not have his dialysis session which he usually does at home.  Denies diarrhea or abdominal discomfort.      Assessment/Plan:    Intractable nausea and vomiting - Likely in setting of gastroparesis; as per patient he has history of gastroparesis - Will avoid Reglan  due to prolonged QTc - Continue Compazine , add hydroxyzine  as needed - Abdominal ultrasound showed gallbladder sludge versus nonshadowing stones.  No evidence of acute cholecystitis, bile duct obstruction.  Hypertensive urgency - Home medications restarted -Continue amlodipine , Cardura , hydralazine , metoprolol , Aldactone  -Will start patient back on Entresto  - Continue IV labetalol  as needed - CT head was negative for acute intracranial abnormality  Troponin elevation - Likely in setting of hypertensive urgency - Denies chest pain at this time - Troponin went up from 70 to 90 - Will continue to trend troponin - EKG showed no acute changes  History of nonischemic cardiomyopathy - Continue Aldactone , metoprolol  - Recently started back on Entresto  in September per cardiology - Will restart Entresto   ESRD on hemodialysis - Patient does dialysis at home - Will consult nephrology  Paroxysmal atrial fibrillation - Presently in normal sinus rhythm - Continue Eliquis  for anticoagulation - Continue metoprolol  for rate control  Hypothyroidism - Continue  Synthroid   Diabetes mellitus type 2 - Last A1c was 5.2 as of 01/07/2023 - Not on medications for diabetes.         DVT prophylaxis: Apixaban   Medications     amLODipine   10 mg Oral Daily   apixaban   5 mg Oral BID   Chlorhexidine  Gluconate Cloth  6 each Topical Q0600   doxazosin   2 mg Oral QHS   hydrALAZINE   25 mg Oral TID   levothyroxine   25 mcg Oral Q0600   metoprolol  succinate  75 mg Oral Daily   sacubitril -valsartan   1 tablet Oral BID   spironolactone   25 mg Oral Daily     Data Reviewed:   CBG:  Recent Labs  Lab 04/17/24 1520 04/17/24 1534  GLUCAP 100* 108*    SpO2: 100 % O2 Flow Rate (L/min): 2 L/min    Vitals:   04/19/24 0345 04/19/24 0400 04/19/24 0450 04/19/24 0807  BP: (!) 184/97 (!) 190/94 (!) 183/95 (!) 173/81  Pulse: 88 91 95 92  Resp: 17 17 20 18   Temp:  98.5 F (36.9 C) 98.7 F (37.1 C) 98.9 F (37.2 C)  TempSrc:  Oral Oral Oral  SpO2: 100% 100% 99% 100%  Weight:      Height:          Data Reviewed:  Basic Metabolic Panel: Recent Labs  Lab 04/17/24 1636 04/18/24 0506  NA 134* 133*  K 4.6 5.3*  CL 97* 97*  CO2 18* 19*  GLUCOSE 127* 88  BUN 40* 47*  CREATININE 11.10* 12.70*  CALCIUM  8.4* 8.1*    CBC: Recent Labs  Lab 04/17/24 1451 04/18/24 0506  WBC 6.2 8.0  NEUTROABS 5.1  --   HGB 10.7* 10.0*  HCT 32.6* 30.5*  MCV 95.0  96.5  PLT 126* 200    LFT Recent Labs  Lab 04/17/24 1636 04/18/24 0506  AST 21 14*  ALT 16 14  ALKPHOS 67 58  BILITOT 0.6 0.5  PROT 7.6 6.7  ALBUMIN 4.5 4.0     Antibiotics: Anti-infectives (From admission, onward)    None        CONSULTS nephrology  Code Status: Full code  Family Communication: No family at bedside     Subjective      Objective    Physical Examination:             [name]   [facility] If 7PM-7AM, please contact night-coverage at [email or web address], Office  [phone]   04/19/2024, 9:32 AM  LOS: 0 days

## 2024-04-19 NOTE — Progress Notes (Signed)
" °   04/19/24 0400  Vitals  Temp 98.5 F (36.9 C)  Temp Source Oral  BP (!) 190/94  MAP (mmHg) 120  BP Location Left Wrist  BP Method Automatic  Pulse Rate 91  Pulse Rate Source Monitor  ECG Heart Rate 91  Resp 17  Oxygen Therapy  SpO2 100 %  O2 Device Nasal Cannula  O2 Flow Rate (L/min) 2 L/min  During Treatment Monitoring  Blood Flow Rate (mL/min) 199 mL/min  Arterial Pressure (mmHg) -101.61 mmHg  Venous Pressure (mmHg) 96.36 mmHg  TMP (mmHg) 24.04 mmHg  Ultrafiltration Rate (mL/min) 1267 mL/min  Dialysate Flow Rate (mL/min) 299 ml/min  Dialysate Potassium Concentration 2  Dialysate Calcium  Concentration 2.5  Duration of HD Treatment -hour(s) 3.5 hour(s)  Cumulative Fluid Removed (mL) per Treatment  3000.23  HD Safety Checks Performed Yes  Intra-Hemodialysis Comments Tx completed  Post Treatment  Dialyzer Clearance Lightly streaked  Liters Processed 73.5  Fluid Removed (mL) 3000 mL  Tolerated HD Treatment Yes  AVG/AVF Arterial Site Held (minutes) 10 minutes  AVG/AVF Venous Site Held (minutes) 10 minutes    "

## 2024-04-20 LAB — HEPATITIS B SURFACE ANTIBODY, QUANTITATIVE: Hep B S AB Quant (Post): <3.5 m[IU]/mL — ABNORMAL LOW

## 2024-05-01 ENCOUNTER — Ambulatory Visit: Attending: Internal Medicine | Admitting: Internal Medicine

## 2024-05-01 ENCOUNTER — Other Ambulatory Visit: Payer: Self-pay | Admitting: Internal Medicine

## 2024-05-01 VITALS — BP 158/80 | HR 78 | Ht 68.0 in | Wt 211.0 lb

## 2024-05-01 DIAGNOSIS — I480 Paroxysmal atrial fibrillation: Secondary | ICD-10-CM | POA: Diagnosis not present

## 2024-05-01 DIAGNOSIS — I10 Essential (primary) hypertension: Secondary | ICD-10-CM

## 2024-05-01 DIAGNOSIS — N186 End stage renal disease: Secondary | ICD-10-CM | POA: Diagnosis not present

## 2024-05-01 DIAGNOSIS — I5020 Unspecified systolic (congestive) heart failure: Secondary | ICD-10-CM

## 2024-05-01 DIAGNOSIS — Z992 Dependence on renal dialysis: Secondary | ICD-10-CM

## 2024-05-01 DIAGNOSIS — E782 Mixed hyperlipidemia: Secondary | ICD-10-CM

## 2024-05-01 DIAGNOSIS — R7989 Other specified abnormal findings of blood chemistry: Secondary | ICD-10-CM

## 2024-05-01 DIAGNOSIS — I5042 Chronic combined systolic (congestive) and diastolic (congestive) heart failure: Secondary | ICD-10-CM | POA: Diagnosis not present

## 2024-05-01 DIAGNOSIS — I4891 Unspecified atrial fibrillation: Secondary | ICD-10-CM

## 2024-05-01 NOTE — Patient Instructions (Signed)
 Medication Instructions:  Your physician has recommended you make the following change in your medication:   STOP: Spironolactone   *If you need a refill on your cardiac medications before your next appointment, please call your pharmacy*  Lab Work: BMP in 2 weeks at any [facility]  If you have labs (blood work) drawn today and your tests are completely normal, you will receive your results only by: MyChart Message (if you have MyChart) OR A paper copy in the mail If you have any lab test that is abnormal or we need to change your treatment, we will call you to review the results.  Testing/Procedures: Your physician has requested that you have a lexiscan myoview. For further information please visit [email or web address] Please follow instruction.   You are scheduled for a Myocardial Perfusion Imaging Study. Please arrive 15 minutes prior to your appointment time for registration and insurance purposes.   The test will take approximately 3 to 4 hours to complete; you may bring reading material.  If someone comes with you to your appointment, they will need to remain in the main lobby due to limited space in the testing area.   How to prepare for your Myocardial Perfusion Test: Do not eat or drink 3 hours prior to your test, except you may have water. Do not consume products containing caffeine (regular or decaffeinated) 12 hours prior to your test. (ex: coffee, chocolate, sodas, tea). Do bring a list of your current medications with you.  If not listed below, you may take your medications as normal. Do Not take Metoprolol  succinate 24 hours before testing   Do wear comfortable clothes (no dresses or overalls) and walking shoes, tennis shoes preferred (No heels or open toe shoes are allowed). Do NOT wear cologne, perfume, aftershave, or lotions (deodorant is allowed). If these instructions are not followed, your test will have to be rescheduled.  If you cannot keep your appointment,  please provide 24 hours notification to the Nuclear Lab, to avoid a possible $50 charge to your account.      Follow-Up: At [facility], you and your health needs are our priority.  As part of our continuing mission to provide you with exceptional heart care, our providers are all part of one team.  This team includes your primary Cardiologist (physician) and Advanced Practice Providers or APPs (Physician Assistants and Nurse Practitioners) who all work together to provide you with the care you need, when you need it.  Your next appointment:   6 month(s)  Provider:   Stanly Leavens, MD or Orren Fabry, PA-C        We recommend signing up for the patient portal called MyChart.  Sign up information is provided on this After Visit Summary.  MyChart is used to connect with patients for Virtual Visits (Telemedicine).  Patients are able to view lab/test results, encounter notes, upcoming appointments, etc.  Non-urgent messages can be sent to your provider as well.   To learn more about what you can do with MyChart, go to [email or web address].   Other Instructions Please monitor your Blood Pressure as discussed with Dr. Leavens  HOW TO TAKE YOUR BLOOD PRESSURE Rest 5 minutes before taking your blood pressure. Don't  smoke or drink caffeinated beverages for at least 30 minutes before. Take your blood pressure before (not after) you eat. Sit comfortably with your back supported and both feet on the floor ( don't cross your legs). Elevate your arm to heart level on a table or a  desk. Use the proper sized cuff.  It should fit smoothly and snugly around your bare upper arm. There should be enough room to slip a fingertip under the cuff.  The bottom edge of the cuff should be 1 inch above the crease of the elbow.  Please monitor your blood pressure once daily 2 hours after your am medication. If you blood pressure consistently remains above 140 (systolic) top number or over 90  ( diastolic) bottom number X 3 days consecutively.  Please call our office at [phone] or send Mychart message.

## 2024-05-01 NOTE — Progress Notes (Signed)
 " Cardiology Office Note:  .    Date:  05/01/2024  ID:  [name], DOB 03/15/1976, MRN [id number] PCP: Florencio Clap, MD  [facility] Providers Cardiologist:  Maude Emmer, MD     CC: HTN  History of Present Illness: [name]    [name] is a 49 y.o. male  with reduced ejection fraction and end stage renal disease who presented for a second opinion on his cardiovascular management. Established care with Dr. Emmer, seen by Dr. Loni after. 2024: Established with me; started on GDMT and AC (HF and AF).  [facility]AD Care: Orren Fabry PA-C  [name] is a 49 yo M with end stage renal disease and heart failure with reduced ejection fraction who presents for evaluation related to his kidney transplant candidacy.  He is undergoing self-administered hemodialysis and is being managed by nephrology. A sleep study is pending as part of his transplant evaluation, but it has not been scheduled yet.  He has a history of heart failure with reduced ejection fraction, with an ejection fraction of 45-50%. Previous nuclear scintigraphy testing was negative, and he underwent a heart catheterization last year. His current medications include Norvasc , Eliquis , hydralazine , and metoprolol . Blood pressure is generally well-controlled during dialysis, with readings around 124/69 to 80, but he experiences variability at other times, sometimes reaching 130s to 150s.  He has paroxysmal atrial fibrillation and is on anticoagulation therapy with Eliquis  for stroke prevention. No significant symptoms related to his heart rhythm issues are reported.  He experiences nausea, which has been improving. He attributes some of his previous blood pressure issues to inconsistent medication adherence, but he has since improved his routine by taking his medications consistently in the morning before getting out of bed.  He has obstructive sleep apnea and has been diagnosed and provided with a machine, but the follow-up  for the sleep study has not been completed.  He is currently on spironolactone , but he does not notice a difference when taking it. He wants to reduce his medication burden if possible.  Relevant histories: .  Social - lives 6 minutes away from here, self HD ROS: As per HPI.   Studies Reviewed: .   Cardiac Studies & Procedures   ______________________________________________________________________________________________     ECHOCARDIOGRAM  ECHOCARDIOGRAM COMPLETE 05/17/2023  Narrative ECHOCARDIOGRAM REPORT    Patient Name:   [name] [facility] Date of Exam: 05/17/2023 Medical Rec #:  [id number]        Height:       69.0 in Accession #:    [id number]       Weight:       207.9 lb Date of Birth:  10/31/1975        BSA:          2.100 m Patient Age:    47 years         BP:           148/84 mmHg Patient Gender: M                HR:           78 bpm. Exam Location:  Inpatient  Procedure: 2D Echo, Cardiac Doppler and Color Doppler  Indications:    Atrial Fibrillation I48.91  History:        Patient has prior history of Echocardiogram examinations, most recent 01/06/2023. Risk Factors:Hypertension.  Sonographer:    Jayson Gaskins Referring Phys: [phone] JARED M GARDNER  IMPRESSIONS   1. Left ventricular  ejection fraction, by estimation, is 45 to 50%. The left ventricle has mildly decreased function. The left ventricle demonstrates global hypokinesis. There is mild left ventricular hypertrophy. Left ventricular diastolic parameters are consistent with Grade III diastolic dysfunction (restrictive). Elevated left atrial pressure. 2. Right ventricular systolic function is normal. The right ventricular size is mildly enlarged. Tricuspid regurgitation signal is inadequate for assessing PA pressure. 3. Left atrial size was moderately dilated. 4. The mitral valve is normal in structure. Trivial mitral valve regurgitation. 5. The aortic valve is tricuspid. Aortic valve regurgitation is  trivial. No aortic stenosis is present. 6. The inferior vena cava is dilated in size with <50% respiratory variability, suggesting right atrial pressure of 15 mmHg.  FINDINGS Left Ventricle: Left ventricular ejection fraction, by estimation, is 45 to 50%. The left ventricle has mildly decreased function. The left ventricle demonstrates global hypokinesis. The left ventricular internal cavity size was normal in size. There is mild left ventricular hypertrophy. Left ventricular diastolic parameters are consistent with Grade III diastolic dysfunction (restrictive). Elevated left atrial pressure.  Right Ventricle: The right ventricular size is mildly enlarged. Right vetricular wall thickness was not well visualized. Right ventricular systolic function is normal. Tricuspid regurgitation signal is inadequate for assessing PA pressure.  Left Atrium: Left atrial size was moderately dilated.  Right Atrium: Right atrial size was normal in size.  Pericardium: There is no evidence of pericardial effusion.  Mitral Valve: The mitral valve is normal in structure. Trivial mitral valve regurgitation.  Tricuspid Valve: The tricuspid valve is normal in structure. Tricuspid valve regurgitation is trivial.  Aortic Valve: The aortic valve is tricuspid. Aortic valve regurgitation is trivial. No aortic stenosis is present. Aortic valve mean gradient measures 5.0 mmHg. Aortic valve peak gradient measures 8.9 mmHg. Aortic valve area, by VTI measures 2.57 cm.  Pulmonic Valve: The pulmonic valve was normal in structure. Pulmonic valve regurgitation is trivial.  Aorta: The aortic root is normal in size and structure.  Venous: The inferior vena cava is dilated in size with less than 50% respiratory variability, suggesting right atrial pressure of 15 mmHg.  IAS/Shunts: The interatrial septum was not well visualized.   LEFT VENTRICLE PLAX 2D LVIDd:         5.10 cm   Diastology LVIDs:         3.70 cm   LV e'  medial:   6.20 cm/s LV PW:         1.50 cm   LV E/e' medial: 21.9 LV IVS:        1.10 cm LVOT diam:     2.00 cm LV SV:         77 LV SV Index:   37 LVOT Area:     3.14 cm   RIGHT VENTRICLE RV S prime:     12.60 cm/s TAPSE (M-mode): 2.8 cm  LEFT ATRIUM              Index        RIGHT ATRIUM           Index LA Vol (A2C):   125.0 ml 59.52 ml/m  RA Area:     16.40 cm LA Vol (A4C):   82.0 ml  39.04 ml/m  RA Volume:   41.10 ml  19.57 ml/m LA Biplane Vol: 102.0 ml 48.57 ml/m AORTIC VALVE AV Area (Vmax):    2.34 cm AV Area (Vmean):   2.51 cm AV Area (VTI):     2.57 cm AV Vmax:  149.00 cm/s AV Vmean:          108.000 cm/s AV VTI:            0.299 m AV Peak Grad:      8.9 mmHg AV Mean Grad:      5.0 mmHg LVOT Vmax:         111.00 cm/s LVOT Vmean:        86.400 cm/s LVOT VTI:          0.245 m LVOT/AV VTI ratio: 0.82  AORTA Ao Root diam: 3.40 cm  MITRAL VALVE MV Area (PHT): 4.00 cm     SHUNTS MV E velocity: 136.00 cm/s  Systemic VTI:  0.24 m MV A velocity: 50.10 cm/s   Systemic Diam: 2.00 cm MV E/A ratio:  2.71  Lonni Nanas MD Electronically signed by Lonni Nanas MD Signature Date/Time: 05/17/2023/5:49:09 PM    Final    MONITORS  LONG TERM MONITOR (3-14 DAYS) 06/12/2023  Narrative   Patient had a minimum heart rate of 65 bpm, maximum heart rate of 154 bpm, and average heart rate of 79 bpm. Predominant underlying rhythm was sinus rhythm. Short runs of SVT, 20 beats at longest. Isolated PACs were rare (<1.0%). Isolated PVCs were rare (<1.0%). No triggered and diary events.  Asymptomatic paroxysmal SVT.      PYP SCAN  MYOCARDIAL AMYLOID PLANAR AND SPECT 04/17/2023  Interpretation Summary   Myocardial uptake was negative for radiotracer uptake. The visual grade of myocardial uptake relative to the ribs was Grade 0 (No myocardial uptake and normal bone uptake).   Findings are not suggestive (Grade 0) of cardiac ATTR  amyloidosis.   Prior study not available for comparison.  ______________________________________________________________________________________________       Physical Exam:    VS:  BP (!) 158/80 (BP Location: Left Arm)   Pulse 78   Ht 5' 8 (1.727 m)   Wt 211 lb (95.7 kg)   SpO2 100%   BMI 32.08 kg/m    Wt Readings from Last 3 Encounters:  05/01/24 211 lb (95.7 kg)  04/17/24 213 lb 3 oz (96.7 kg)  12/20/23 213 lb 3.2 oz (96.7 kg)    Gen: No distress  Neck: No JVD Cardiac: No Rubs or Gallops, S3, RRR +2 radial pulses, Strong R arm bruit and thrill Respiratory: Clear to auscultation bilaterally, normal effort, normal  respiratory rate GI: Soft, nontender, non-distended  MS: No edema;  moves all extremities Integument: Skin feels warm, well bandaged access site with no bleeding; good bruit and thrill Neuro:  At time of evaluation, alert and oriented to person/place/time/situation  Psych: Normal affect, patient feels well   ASSESSMENT AND PLAN: .    Heart failure with reduced ejection fraction (EF 45-50%) Heart failure with reduced ejection fraction, EF 45-50%. Previous negative nuclear scintigraphy testing. No current chest pain or symptoms. Stress test required for kidney transplant evaluation. Potential need for heart catheterization if stress test is positive, despite being asymptomatic, to facilitate kidney transplant process. - Ordered nuclear medicine stress test (largely for transplant evaluation -kidney) - Will consider heart catheterization if stress test is positive (R Femoral vs L radial)  End stage renal disease on hemodialysis End stage renal disease managed by nephrology. Self-managed hemodialysis. Evaluation for kidney transplant ongoing, pending completion of sleep study. Spironolactone  questioned for efficacy in end stage renal disease. Discussion of potential kidney transplant process and associated testing requirements. - Stopped spironolactone  - Ordered BMP  in two weeks - Will increase hydralazine   if blood pressure increases after stopping spironolactone  - Will coordinate with sleep team to complete OSA treatment start  Paroxysmal atrial fibrillation Managed on anticoagulation with Eliquis  for stroke prevention. No changes in management as stroke risk remains significant. - Continue Eliquis  for stroke prevention  Hypertension Management complicated by end stage renal disease and self-managed hemodialysis. Blood pressure control is crucial for kidney transplant evaluation. Current medications include Norvasc , hydralazine , and metoprolol . Spironolactone  and Entresto  questioned for efficacy in this context. Discussion of potential medication adjustments based on blood pressure response. - Stopped spironolactone  - Will increase hydralazine  if blood pressure increases after stopping spironolactone  - Will consider stopping Entresto  if blood pressure remains controlled after stopping spironolactone   Obstructive sleep apnea Diagnosed with obstructive sleep apnea. Treatment could aid in blood pressure management. Sleep study pending completion to facilitate kidney transplant evaluation. - Coordinated with sleep team to complete sleep study  Six months with me or Orren Stanly Leavens, MD FASE [facility] Cardiologist [facility]  [address] [place], [address] [place], [place] [address] [phone]  11:36 AM  "

## 2024-05-02 ENCOUNTER — Telehealth (HOSPITAL_COMMUNITY): Payer: Self-pay | Admitting: *Deleted

## 2024-05-02 NOTE — Telephone Encounter (Signed)
 Err-

## 2024-05-02 NOTE — Telephone Encounter (Signed)
 Patient given detailed instructions per Myocardial Perfusion Study Information Sheet for the test on 05/08/2024 at 10:45. Patient notified to arrive 15 minutes early and that it is imperative to arrive on time for appointment to keep from having the test rescheduled.  If you need to cancel or reschedule your appointment, please call the office within 24 hours of your appointment. . Patient verbalized understanding.[name]

## 2024-05-08 ENCOUNTER — Ambulatory Visit (HOSPITAL_COMMUNITY)
Admission: RE | Admit: 2024-05-08 | Discharge: 2024-05-08 | Disposition: A | Discharge status: Home / Self Care | Source: Ambulatory Visit | Attending: Cardiology | Admitting: Cardiology

## 2024-05-08 DIAGNOSIS — I5020 Unspecified systolic (congestive) heart failure: Secondary | ICD-10-CM | POA: Diagnosis not present

## 2024-05-08 DIAGNOSIS — N186 End stage renal disease: Secondary | ICD-10-CM | POA: Diagnosis not present

## 2024-05-08 MED ORDER — TECHNETIUM TC 99M TETROFOSMIN IV KIT
32.6000 | PACK | Freq: Once | INTRAVENOUS | Status: AC | PRN
  Administered 2024-05-08: 32.6 via INTRAVENOUS

## 2024-05-08 MED ORDER — REGADENOSON 0.4 MG/5ML IV SOLN
INTRAVENOUS | Status: AC
  Filled 2024-05-08: qty 5

## 2024-05-08 MED ORDER — TECHNETIUM TC 99M TETROFOSMIN IV KIT
10.6000 | PACK | Freq: Once | INTRAVENOUS | Status: AC | PRN
  Administered 2024-05-08: 10.6 via INTRAVENOUS

## 2024-05-08 MED ORDER — REGADENOSON 0.4 MG/5ML IV SOLN
0.4000 mg | Freq: Once | INTRAVENOUS | Status: AC
  Administered 2024-05-08: 0.4 mg via INTRAVENOUS

## 2024-05-09 ENCOUNTER — Ambulatory Visit: Payer: Self-pay | Admitting: Internal Medicine

## 2024-05-09 LAB — MYOCARDIAL PERFUSION IMAGING
Base ST Depression (mm): 0 mm
LV dias vol: 191 mL (ref 62–150)
LV sys vol: 75 mL
Nuc Stress EF: 61 %
Peak HR: 93 {beats}/min
Rest HR: 81 {beats}/min
Rest Nuclear Isotope Dose: 10.6 mCi
SDS: 1
SRS: 0
SSS: 1
ST Depression (mm): 0 mm
Stress Nuclear Isotope Dose: 32.6 mCi
TID: 1.05

## 2024-05-10 ENCOUNTER — Telehealth: Payer: Self-pay | Admitting: *Deleted

## 2024-05-10 NOTE — Telephone Encounter (Signed)
**Note De-Identified [name] Obfuscation** The pt has [employer]. I have sent the order to [employer] so they can do the Itamar-HST PA and mail the device to the pt.

## 2024-05-10 NOTE — Telephone Encounter (Signed)
 He is suppose to have a itamar sleep study done. It looks like [facility] was checking to see if his insurance is OON-out of network. The patient has been notified.

## 2024-05-10 NOTE — Telephone Encounter (Signed)
-----   Message from Nurse Hamp HERO, RN sent at 05/01/2024  9:28 AM EST ----- Regarding: CPAP titration Dr. Santo had an OV with this pt today.  Pt expresses has had sleep study but has not been contacted for titration.  Could someone f/u?  Thanks  Hamp, RN

## 2024-05-31 ENCOUNTER — Ambulatory Visit: Admitting: Internal Medicine
